# Patient Record
Sex: Female | Born: 1970
Health system: Southern US, Community
[De-identification: ages and names within clinical notes are randomized; demographics above are authoritative.]

## PROBLEM LIST (undated history)

## (undated) ENCOUNTER — Emergency Department (HOSPITAL_COMMUNITY): Admission: EM | Payer: Commercial Managed Care - HMO | Source: Home / Self Care

## (undated) DIAGNOSIS — J449 Chronic obstructive pulmonary disease, unspecified: Secondary | ICD-10-CM

## (undated) DIAGNOSIS — F419 Anxiety disorder, unspecified: Secondary | ICD-10-CM

## (undated) DIAGNOSIS — G8929 Other chronic pain: Secondary | ICD-10-CM

## (undated) DIAGNOSIS — M1990 Unspecified osteoarthritis, unspecified site: Secondary | ICD-10-CM

## (undated) DIAGNOSIS — C189 Malignant neoplasm of colon, unspecified: Secondary | ICD-10-CM

## (undated) DIAGNOSIS — J45909 Unspecified asthma, uncomplicated: Secondary | ICD-10-CM

## (undated) DIAGNOSIS — M797 Fibromyalgia: Secondary | ICD-10-CM

## (undated) DIAGNOSIS — F32A Depression, unspecified: Secondary | ICD-10-CM

## (undated) DIAGNOSIS — F319 Bipolar disorder, unspecified: Secondary | ICD-10-CM

## (undated) DIAGNOSIS — M5416 Radiculopathy, lumbar region: Secondary | ICD-10-CM

## (undated) DIAGNOSIS — F4310 Post-traumatic stress disorder, unspecified: Secondary | ICD-10-CM

## (undated) DIAGNOSIS — F329 Major depressive disorder, single episode, unspecified: Secondary | ICD-10-CM

## (undated) DIAGNOSIS — I509 Heart failure, unspecified: Secondary | ICD-10-CM

## (undated) DIAGNOSIS — M109 Gout, unspecified: Secondary | ICD-10-CM

## (undated) DIAGNOSIS — L80 Vitiligo: Secondary | ICD-10-CM

## (undated) DIAGNOSIS — C50919 Malignant neoplasm of unspecified site of unspecified female breast: Secondary | ICD-10-CM

## (undated) DIAGNOSIS — N289 Disorder of kidney and ureter, unspecified: Secondary | ICD-10-CM

## (undated) DIAGNOSIS — I219 Acute myocardial infarction, unspecified: Secondary | ICD-10-CM

## (undated) DIAGNOSIS — M549 Dorsalgia, unspecified: Secondary | ICD-10-CM

## (undated) HISTORY — DX: Malignant neoplasm of unspecified site of unspecified female breast: C50.919

## (undated) HISTORY — PX: TUBAL LIGATION: SHX77

## (undated) HISTORY — DX: Malignant neoplasm of colon, unspecified: C18.9

---

## 2014-06-21 ENCOUNTER — Emergency Department (HOSPITAL_COMMUNITY)
Admission: EM | Admit: 2014-06-21 | Discharge: 2014-06-21 | Discharge status: Against Medical Advice | Payer: Medicare Other | Attending: Emergency Medicine | Admitting: Emergency Medicine

## 2014-06-21 ENCOUNTER — Encounter (HOSPITAL_COMMUNITY): Payer: Self-pay | Admitting: Emergency Medicine

## 2014-06-21 ENCOUNTER — Emergency Department (HOSPITAL_COMMUNITY): Payer: Medicare Other

## 2014-06-21 DIAGNOSIS — Z79899 Other long term (current) drug therapy: Secondary | ICD-10-CM | POA: Diagnosis not present

## 2014-06-21 DIAGNOSIS — R079 Chest pain, unspecified: Secondary | ICD-10-CM | POA: Insufficient documentation

## 2014-06-21 DIAGNOSIS — Z8739 Personal history of other diseases of the musculoskeletal system and connective tissue: Secondary | ICD-10-CM | POA: Insufficient documentation

## 2014-06-21 DIAGNOSIS — G8929 Other chronic pain: Secondary | ICD-10-CM | POA: Insufficient documentation

## 2014-06-21 DIAGNOSIS — M545 Low back pain: Secondary | ICD-10-CM | POA: Diagnosis not present

## 2014-06-21 DIAGNOSIS — F329 Major depressive disorder, single episode, unspecified: Secondary | ICD-10-CM | POA: Insufficient documentation

## 2014-06-21 DIAGNOSIS — I509 Heart failure, unspecified: Secondary | ICD-10-CM | POA: Insufficient documentation

## 2014-06-21 DIAGNOSIS — F419 Anxiety disorder, unspecified: Secondary | ICD-10-CM | POA: Diagnosis not present

## 2014-06-21 DIAGNOSIS — Z720 Tobacco use: Secondary | ICD-10-CM | POA: Diagnosis not present

## 2014-06-21 DIAGNOSIS — M549 Dorsalgia, unspecified: Secondary | ICD-10-CM

## 2014-06-21 HISTORY — DX: Radiculopathy, lumbar region: M54.16

## 2014-06-21 HISTORY — DX: Heart failure, unspecified: I50.9

## 2014-06-21 HISTORY — DX: Dorsalgia, unspecified: M54.9

## 2014-06-21 HISTORY — DX: Major depressive disorder, single episode, unspecified: F32.9

## 2014-06-21 HISTORY — DX: Depression, unspecified: F32.A

## 2014-06-21 HISTORY — DX: Other chronic pain: G89.29

## 2014-06-21 HISTORY — DX: Anxiety disorder, unspecified: F41.9

## 2014-06-21 LAB — CBC WITH DIFFERENTIAL/PLATELET
Basophils Absolute: 0.1 10*3/uL (ref 0.0–0.1)
Basophils Relative: 1 % (ref 0–1)
Eosinophils Absolute: 0.1 10*3/uL (ref 0.0–0.7)
Eosinophils Relative: 2 % (ref 0–5)
HCT: 28.6 % — ABNORMAL LOW (ref 36.0–46.0)
Hemoglobin: 8.3 g/dL — ABNORMAL LOW (ref 12.0–15.0)
LYMPHS PCT: 20 % (ref 12–46)
Lymphs Abs: 1.9 10*3/uL (ref 0.7–4.0)
MCH: 16.9 pg — AB (ref 26.0–34.0)
MCHC: 29 g/dL — ABNORMAL LOW (ref 30.0–36.0)
MCV: 58.1 fL — ABNORMAL LOW (ref 78.0–100.0)
MONOS PCT: 5 % (ref 3–12)
Monocytes Absolute: 0.4 10*3/uL (ref 0.1–1.0)
NEUTROS PCT: 73 % (ref 43–77)
Neutro Abs: 6.7 10*3/uL (ref 1.7–7.7)
Platelets: 418 10*3/uL — ABNORMAL HIGH (ref 150–400)
RBC: 4.92 MIL/uL (ref 3.87–5.11)
RDW: 19.8 % — ABNORMAL HIGH (ref 11.5–15.5)
WBC: 9.2 10*3/uL (ref 4.0–10.5)

## 2014-06-21 LAB — BASIC METABOLIC PANEL
Anion gap: 14 (ref 5–15)
BUN: 15 mg/dL (ref 6–23)
CHLORIDE: 103 meq/L (ref 96–112)
CO2: 21 meq/L (ref 19–32)
Calcium: 8.7 mg/dL (ref 8.4–10.5)
Creatinine, Ser: 0.85 mg/dL (ref 0.50–1.10)
GFR calc Af Amer: >90 mL/min (ref >90)
GFR, EST NON AFRICAN AMERICAN: 83 mL/min — AB (ref >90)
GLUCOSE: 92 mg/dL (ref 70–99)
POTASSIUM: 3.6 meq/L — AB (ref 3.7–5.3)
Sodium: 138 mEq/L (ref 137–147)

## 2014-06-21 LAB — TROPONIN I

## 2014-06-21 LAB — PRO B NATRIURETIC PEPTIDE: PRO B NATRI PEPTIDE: 65.7 pg/mL (ref 0–125)

## 2014-06-21 MED ORDER — OXYCODONE-ACETAMINOPHEN 5-325 MG PO TABS
1.0000 | ORAL_TABLET | Freq: Once | ORAL | Status: AC
  Administered 2014-06-21: 1 via ORAL
  Filled 2014-06-21: qty 1

## 2014-06-21 MED ORDER — LORAZEPAM 1 MG PO TABS
1.0000 mg | ORAL_TABLET | Freq: Once | ORAL | Status: AC
  Administered 2014-06-21: 1 mg via ORAL
  Filled 2014-06-21: qty 1

## 2014-06-21 NOTE — ED Notes (Signed)
Patient reports sudden onset of centralized chest pain that started approximately an hour ago. Patient also reports shortness of breath and numbness down right side. Patient also states is under a great deal of stress currently and feels anxiety may be playing a role. Patient is currently homeless.

## 2014-06-21 NOTE — ED Notes (Signed)
Pt's family to desk stating pt is requesting to leave AMA.  Pt states she cannot stay until 2000 for repeat troponin due to transportation reasons.  Explained risks and benefits to staying and leaving.  Pt verbalized understanding.  Encouraged pt if pain gets worse to call 911.  Verbalized understanding.  Pt signed AMA form.

## 2014-06-21 NOTE — ED Provider Notes (Signed)
CSN: [id number]     Arrival date & time 06/21/14  1432 History   First MD Initiated Contact with Patient 06/21/14 1505     Chief Complaint  Patient presents with  . Chest Pain  . Anxiety  . Back Pain      HPI Pt was seen at 1551. Per pt, c/o gradual onset and persistence of constant mid-sternal chest "pain" that began approximately 1 hour PTA. Pt states she felt "SOB" when the discomfort began. Pt also c/o acute flair of her chronic right sided low back pain and chronic RLE "numbness." Pt states her symptoms began "after I was on the phone getting into it with someone." Endorses she "feels anxious" and that her symptoms today "might just be my anxiety."  Denies incont/retention of bowel or bladder, no saddle anesthesia, no focal motor weakness, no new tingling/numbness in extremities, no fevers, no injury, no abd pain, no palpitations, no cough.    Past Medical History  Diagnosis Date  . CHF (congestive heart failure)   . Anxiety   . Depression   . Chronic back pain   . Right lumbar radiculopathy    Past Surgical History  Procedure Laterality Date  . Tubal ligation      History  Substance Use Topics  . Smoking status: Current Every Day Smoker  . Smokeless tobacco: Not on file  . Alcohol Use: No    Review of Systems ROS: Statement: All systems negative except as marked or noted in the HPI; Constitutional: Negative for fever and chills. ; ; Eyes: Negative for eye pain, redness and discharge. ; ; ENMT: Negative for ear pain, hoarseness, nasal congestion, sinus pressure and sore throat. ; ; Cardiovascular: +CP, SOB. Negative for palpitations, diaphoresis, and peripheral edema. ; ; Respiratory: Negative for cough, wheezing and stridor. ; ; Gastrointestinal: Negative for nausea, vomiting, diarrhea, abdominal pain, blood in stool, hematemesis, jaundice and rectal bleeding. . ; ; Genitourinary: Negative for dysuria, flank pain and hematuria. ; ; Musculoskeletal: +LBP. Negative for neck  pain. Negative for swelling and trauma.; ; Skin: Negative for pruritus, rash, abrasions, blisters, bruising and skin lesion.; ; Neuro: +paresthesias. Negative for headache, lightheadedness and neck stiffness. Negative for weakness, altered level of consciousness , altered mental status, extremity weakness, involuntary movement, seizure and syncope.; Psych:  +anxiety. No SI, no SA, no HI, no hallucinations.      Allergies  Review of patient's allergies indicates no known allergies.  Home Medications   Prior to Admission medications   Medication Sig Start Date End Date Taking? Authorizing Provider  ALPRAZolam Duanne Moron) 1 MG tablet Take 1 mg by mouth at bedtime as needed for anxiety.   Yes Historical Provider, MD  nitroGLYCERIN (NITROSTAT) 0.4 MG SL tablet Place 0.4 mg under the tongue every 5 (five) minutes as needed for chest pain.   Yes Historical Provider, MD   BP 107/65  Pulse 84  Temp(Src) 98.1 F (36.7 C) (Oral)  Resp 19  Ht 5\' 2"  (1.575 m)  Wt 160 lb (72.576 kg)  BMI 29.26 kg/m2  SpO2 100%  LMP 06/02/2014 Physical Exam 1520: Physical examination:  Nursing notes reviewed; Vital signs and O2 SAT reviewed;  Constitutional: Well developed, Well nourished, Well hydrated, In no acute distress; Head:  Normocephalic, atraumatic; Eyes: EOMI, PERRL, No scleral icterus; ENMT: Mouth and pharynx normal, Mucous membranes moist; Neck: Supple, Full range of motion, No lymphadenopathy; Cardiovascular: Regular rate and rhythm, No murmur, rub, or gallop; Respiratory: Breath sounds clear & equal bilaterally, No  rales, rhonchi, wheezes.  Speaking full sentences with ease, Normal respiratory effort/excursion; Chest: Nontender, Movement normal; Abdomen: Soft, Nontender, Nondistended, Normal bowel sounds; Genitourinary: No CVA tenderness; Spine:  No midline CS, TS, LS tenderness. +TTP right lumbar paraspinal muscles.;; Extremities: Pulses normal, No tenderness, No edema, No calf edema or asymmetry.; Neuro:  AA&Ox3, Major CN grossly intact.Speech clear.  No facial droop.  No nystagmus. Grips equal. Strength 5/5 equal bilat UE's and LE's.  DTR 2/4 equal bilat UE's and LE's.  No gross sensory deficits.  Normal cerebellar testing bilat UE's (finger-nose) and LE's (heel-shin). Neg straight leg raises bilat..; Skin: Color normal, Warm, Dry.; Psych:  Anxious, poor eye contact.    ED Course  Procedures     EKG Interpretation   Date/Time:  Sunday June 21 2014 14:39:47 EDT Ventricular Rate:  86 PR Interval:  146 QRS Duration: 92 QT Interval:  383 QTC Calculation: 458 R Axis:   42 Text Interpretation:  Sinus rhythm No old tracing to compare Confirmed by  [facility]  MD, Nunzio Cory [phone]) on 06/21/2014 3:41:07 PM      MDM  MDM Reviewed: nursing note and vitals Interpretation: labs, ECG and x-ray   Results for orders placed during the hospital encounter of 06/21/14  CBC WITH DIFFERENTIAL      Result Value Ref Range   WBC 9.2  4.0 - 10.5 K/uL   RBC 4.92  3.87 - 5.11 MIL/uL   Hemoglobin 8.3 (*) 12.0 - 15.0 g/dL   HCT 28.6 (*) 36.0 - 46.0 %   MCV 58.1 (*) 78.0 - 100.0 fL   MCH 16.9 (*) 26.0 - 34.0 pg   MCHC 29.0 (*) 30.0 - 36.0 g/dL   RDW 19.8 (*) 11.5 - 15.5 %   Platelets 418 (*) 150 - 400 K/uL   Neutrophils Relative % 73  43 - 77 %   Neutro Abs 6.7  1.7 - 7.7 K/uL   Lymphocytes Relative 20  12 - 46 %   Lymphs Abs 1.9  0.7 - 4.0 K/uL   Monocytes Relative 5  3 - 12 %   Monocytes Absolute 0.4  0.1 - 1.0 K/uL   Eosinophils Relative 2  0 - 5 %   Eosinophils Absolute 0.1  0.0 - 0.7 K/uL   Basophils Relative 1  0 - 1 %   Basophils Absolute 0.1  0.0 - 0.1 K/uL  BASIC METABOLIC PANEL      Result Value Ref Range   Sodium 138  137 - 147 mEq/L   Potassium 3.6 (*) 3.7 - 5.3 mEq/L   Chloride 103  96 - 112 mEq/L   CO2 21  19 - 32 mEq/L   Glucose, Bld 92  70 - 99 mg/dL   BUN 15  6 - 23 mg/dL   Creatinine, Ser 0.85  0.50 - 1.10 mg/dL   Calcium 8.7  8.4 - 10.5 mg/dL   GFR calc non Af Amer  83 (*) >90 mL/min   GFR calc Af Amer >90  >90 mL/min   Anion gap 14  5 - 15  TROPONIN I      Result Value Ref Range   Troponin I <0.30  <0.30 ng/mL  PRO B NATRIURETIC PEPTIDE      Result Value Ref Range   Pro B Natriuretic peptide (BNP) 65.7  0 - 125 pg/mL   Dg Chest 2 View 06/21/2014   CLINICAL DATA:  Chest pain, history of congestive heart failure  EXAM: CHEST  2 VIEW  COMPARISON:  None.  FINDINGS: Cardiomediastinal silhouette is unremarkable. No acute infiltrate or pleural effusion. No pulmonary edema. Bony thorax is unremarkable.  IMPRESSION: No active cardiopulmonary disease.   Electronically Signed   By: Lahoma Crocker M.D.   On: 06/21/2014 15:46    1715:  Pt states she feels better after pain and anxiety medications and wants to leave now. Pt cannot elaborate her hx of "CHF." Workup today reassuring, will obtain repeat troponin at 2000. Pt states she does not want to stay.  ED RN and I encouraged pt to stay for 2nd troponin, continues to refuse.  Pt makes her own medical decisions.  Risks of AMA explained to pt, including, but not limited to:  stroke, heart attack, cardiac arrythmia ("irregular heart rate/beat"), "passing out," temporary and/or permanent disability, death.  Pt verb understanding and continues to refuse to stay for 2nd troponin, understanding the consequences of her decision.  I encouraged pt to follow up with her PMD tomorrow and return to the ED immediately if symptoms return, or for any other concerns.  Pt verb understanding, agreeable.     Francine Graven, DO 06/22/14 [phone]

## 2015-05-04 ENCOUNTER — Emergency Department (HOSPITAL_COMMUNITY)
Admission: EM | Admit: 2015-05-04 | Discharge: 2015-05-04 | Disposition: A | Discharge status: Home / Self Care | Payer: Commercial Managed Care - HMO | Attending: Emergency Medicine | Admitting: Emergency Medicine

## 2015-05-04 ENCOUNTER — Encounter (HOSPITAL_COMMUNITY): Payer: Self-pay | Admitting: Emergency Medicine

## 2015-05-04 DIAGNOSIS — Z8739 Personal history of other diseases of the musculoskeletal system and connective tissue: Secondary | ICD-10-CM | POA: Diagnosis not present

## 2015-05-04 DIAGNOSIS — G8929 Other chronic pain: Secondary | ICD-10-CM | POA: Diagnosis not present

## 2015-05-04 DIAGNOSIS — F419 Anxiety disorder, unspecified: Secondary | ICD-10-CM | POA: Diagnosis not present

## 2015-05-04 DIAGNOSIS — F329 Major depressive disorder, single episode, unspecified: Secondary | ICD-10-CM | POA: Insufficient documentation

## 2015-05-04 DIAGNOSIS — Z720 Tobacco use: Secondary | ICD-10-CM | POA: Insufficient documentation

## 2015-05-04 DIAGNOSIS — K122 Cellulitis and abscess of mouth: Secondary | ICD-10-CM | POA: Insufficient documentation

## 2015-05-04 DIAGNOSIS — I509 Heart failure, unspecified: Secondary | ICD-10-CM | POA: Insufficient documentation

## 2015-05-04 DIAGNOSIS — J029 Acute pharyngitis, unspecified: Secondary | ICD-10-CM | POA: Diagnosis present

## 2015-05-04 MED ORDER — AMOXICILLIN 250 MG PO CAPS
500.0000 mg | ORAL_CAPSULE | Freq: Once | ORAL | Status: AC
  Administered 2015-05-04: 500 mg via ORAL
  Filled 2015-05-04: qty 2

## 2015-05-04 MED ORDER — AMOXICILLIN 500 MG PO CAPS: 500.0000 mg | ORAL_CAPSULE | Freq: Three times a day (TID) | ORAL | Status: DC

## 2015-05-04 NOTE — ED Notes (Signed)
Pt states that she had Gi bleed 3 weeks ago and while vomiting sustained tear in her oesophagus - ( [facility]) pt here today as she is having continued throat pain

## 2015-05-04 NOTE — ED Provider Notes (Signed)
CSN: [id number]     Arrival date & time 05/04/15  2127 History   First MD Initiated Contact with Patient 05/04/15 2228     Chief Complaint  Patient presents with  . Sore Throat     (Consider location/radiation/quality/duration/timing/severity/associated sxs/prior Treatment) HPI Comments: Patient is a 44 year old female who presents to the emergency department with a complaint of sore throat.  The patient states that she was seen at the [facility] approximately 3 weeks ago and was told that she had a tear in her esophagus related to violent vomiting. The patient was seen by GI specialist, the bleeding has been controlled, but she was not placed on any medications at this time. The patient states that over the last few days she's been having increasing discomfort in her throat. She states it feels like her food sticks, and feels like something is sticking in her throat. She's not had high fever. There's been no further vomiting, and no diarrhea reported.  Patient is a 44 y.o. female presenting with pharyngitis. The history is provided by the patient.  Sore Throat    Past Medical History  Diagnosis Date  . CHF (congestive heart failure)   . Anxiety   . Depression   . Chronic back pain   . Right lumbar radiculopathy    Past Surgical History  Procedure Laterality Date  . Tubal ligation    . Cesarean section     History reviewed. No pertinent family history. Social History  Substance Use Topics  . Smoking status: Current Every Day Smoker -- 1.00 packs/day    Types: Cigarettes  . Smokeless tobacco: Former Systems developer  . Alcohol Use: No   OB History    No data available     Review of Systems    Allergies  Review of patient's allergies indicates no known allergies.  Home Medications   Prior to Admission medications   Medication Sig Start Date End Date Taking? Authorizing Provider  ALPRAZolam Duanne Moron) 1 MG tablet Take 1 mg by mouth at bedtime as needed for anxiety.     Historical Provider, MD  nitroGLYCERIN (NITROSTAT) 0.4 MG SL tablet Place 0.4 mg under the tongue every 5 (five) minutes as needed for chest pain.    Historical Provider, MD   BP 113/83 mmHg  Pulse 64  Temp(Src) 98.1 F (36.7 C) (Oral)  Resp 18  Ht 5\' 2"  (1.575 m)  Wt 180 lb (81.647 kg)  BMI 32.91 kg/m2  SpO2 100%  LMP 04/20/2015 (Approximate) Physical Exam  Constitutional: She is oriented to person, place, and time. She appears well-developed and well-nourished.  Non-toxic appearance.  HENT:  Head: Normocephalic.  Right Ear: Tympanic membrane and external ear normal.  Left Ear: Tympanic membrane and external ear normal.  There is mild increased redness of the posterior pharynx. The uvula is enlarged. The airway is patent. There is no swelling under the tongue.  Eyes: EOM and lids are normal. Pupils are equal, round, and reactive to light.  Neck: Normal range of motion. Neck supple. Carotid bruit is not present.  Cardiovascular: Normal rate, regular rhythm, normal heart sounds, intact distal pulses and normal pulses.   Pulmonary/Chest: Breath sounds normal. No respiratory distress.  Abdominal: Soft. Bowel sounds are normal. There is no tenderness. There is no guarding.  Musculoskeletal: Normal range of motion.  Lymphadenopathy:       Head (right side): No submandibular adenopathy present.       Head (left side): No submandibular adenopathy present.  She has no cervical adenopathy.  Neurological: She is alert and oriented to person, place, and time. She has normal strength. No cranial nerve deficit or sensory deficit.  Skin: Skin is warm and dry.  Psychiatric: She has a normal mood and affect. Her speech is normal.  Nursing note and vitals reviewed.   ED Course  Procedures (including critical care time) Labs Review Labs Reviewed - No data to display  Imaging Review No results found. I have personally reviewed and evaluated these images and lab results as part of my medical  decision-making.   EKG Interpretation None      MDM  The examination is consistent with uvulitis. The patient will use salt water gargles. The patient is placed on Amoxil. The patient is to follow-up with the primary physician if any changes or problems. The airway is patent. The patient is in no distress whatsoever while here in the emergency department.    Final diagnoses:  Uvulitis    **I have reviewed nursing notes, vital signs, and all appropriate lab and imaging results for this patient.Lily Kocher, PA-C 97/53/00 [id number]  Delora Fuel, MD 21/11/73 [id number]

## 2015-05-04 NOTE — Discharge Instructions (Signed)
You have swelling of your uvula. Please use salt water gargles 3 times daily. Please use Amoxil 3 times daily. Please see your primary physician for recheck if not improving. Uvulitis Uvulitis is redness and soreness (inflammation) of the uvula. The uvula is the small tongue-shaped piece of tissue in the back of your mouth.  CAUSES Infection is a common cause of uvulitis. Infection of the uvula can be either viral or bacterial. Infectious uvulitis usually only occurs in association with another condition, such as inflammation and infection of the mouth or throat. Other causes of uvulitis include:  Trauma to the uvula.  Swelling from excess fluid buildup (edema), which may be an allergic reaction.  Inhalation of irritants, such as chemical agents, smoke, or steam. DIAGNOSIS Your caregiver can usually diagnose uvulitis through a physical examination. Bacterial uvulitis can be diagnosed through the results of the growth of samples of bodily substances taken from your mouth (cultures). HOME CARE INSTRUCTIONS   Rest as much as possible.  Young children may suck on frozen juice bars or frozen ice pops. Older children and adults may gargle with a warm or cold liquid to help soothe the throat. (Mix  tsp of salt in 8 oz of water, or use strong tea.)  Use a cool-mist humidifier to lessen throat irritation and cough.  Drink enough fluids to keep your urine clear or pale yellow.  While the throat is very sore, eat soft or liquid foods such as milk, ice cream, soups, or milk drinks.  Family members who develop a sore throat or fever should have a medical exam or throat culture.  If your child has uvulitis and is taking antibiotic medicine, wait 24 hours or until his or her temperature is near normal (less than 100 F [37.8 C]) before allowing him or her to return to school or day care.  Only take over-the-counter or prescription medicines for pain, discomfort, or fever as directed by your  caregiver. Ask when your test results will be ready. Make sure you get your test results. SEEK MEDICAL CARE IF:   You have an oral temperature above 102 F (38.9 C).  You develop large, tender lumps your the neck.  Your child develops a rash.  You cough up green, yellow-brown, or bloody substances. SEEK IMMEDIATE MEDICAL CARE IF:   You develop any new symptoms, such as vomiting, earache, severe headache, stiff neck, chest pain, or trouble breathing or swallowing.  Your airway is blocked.  You develop more severe throat pain along with drooling or voice changes. Document Released: 04/07/2004 Document Revised: 11/20/2011 Document Reviewed: 11/03/2010 [facility] Patient Information 2015 [place], [place]. This information is not intended to replace advice given to you by your health care provider. Make sure you discuss any questions you have with your health care provider.

## 2015-05-05 ENCOUNTER — Encounter: Payer: Self-pay | Admitting: Adult Health

## 2015-05-10 ENCOUNTER — Ambulatory Visit: Payer: Self-pay | Admitting: Family Medicine

## 2015-05-11 ENCOUNTER — Encounter: Payer: Self-pay | Admitting: General Practice

## 2015-05-21 ENCOUNTER — Ambulatory Visit (INDEPENDENT_AMBULATORY_CARE_PROVIDER_SITE_OTHER): Payer: Medicare HMO | Admitting: Family Medicine

## 2015-05-21 ENCOUNTER — Encounter: Payer: Self-pay | Admitting: Family Medicine

## 2015-05-21 VITALS — BP 104/67 | HR 79 | Temp 97.7°F | Ht 62.0 in | Wt 186.6 lb

## 2015-05-21 DIAGNOSIS — F329 Major depressive disorder, single episode, unspecified: Secondary | ICD-10-CM

## 2015-05-21 DIAGNOSIS — F32A Depression, unspecified: Secondary | ICD-10-CM

## 2015-05-21 DIAGNOSIS — K226 Gastro-esophageal laceration-hemorrhage syndrome: Secondary | ICD-10-CM | POA: Diagnosis not present

## 2015-05-21 DIAGNOSIS — N920 Excessive and frequent menstruation with regular cycle: Secondary | ICD-10-CM | POA: Insufficient documentation

## 2015-05-21 DIAGNOSIS — G629 Polyneuropathy, unspecified: Secondary | ICD-10-CM | POA: Diagnosis not present
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
## 2015-05-22 LAB — CBC WITH DIFFERENTIAL/PLATELET
BASOS: 1 %
Basophils Absolute: 0.1 10*3/uL (ref 0.0–0.2)
EOS (ABSOLUTE): 0.2 10*3/uL (ref 0.0–0.4)
EOS: 2 %
HEMOGLOBIN: 8.7 g/dL — AB (ref 11.1–15.9)
Hematocrit: 30.7 % — ABNORMAL LOW (ref 34.0–46.6)
Immature Grans (Abs): 0 10*3/uL (ref 0.0–0.1)
Immature Granulocytes: 0 %
LYMPHS ABS: 2.2 10*3/uL (ref 0.7–3.1)
LYMPHS: 24 %
MCH: 17.4 pg — ABNORMAL LOW (ref 26.6–33.0)
MCHC: 28.3 g/dL — AB (ref 31.5–35.7)
MCV: 62 fL — AB (ref 79–97)
Monocytes Absolute: 0.5 10*3/uL (ref 0.1–0.9)
Monocytes: 5 %
NEUTROS ABS: 6.1 10*3/uL (ref 1.4–7.0)
Neutrophils: 68 %
Platelets: 355 10*3/uL (ref 150–379)
RBC: 4.99 x10E6/uL (ref 3.77–5.28)
RDW: 18.1 % — ABNORMAL HIGH (ref 12.3–15.4)
WBC: 9 10*3/uL (ref 3.4–10.8)

## 2015-05-22 LAB — CMP14+EGFR
ALBUMIN: 3.8 g/dL (ref 3.5–5.5)
ALT: 10 IU/L (ref 0–32)
AST: 13 IU/L (ref 0–40)
Albumin/Globulin Ratio: 1.4 (ref 1.1–2.5)
Alkaline Phosphatase: 82 IU/L (ref 39–117)
BUN / CREAT RATIO: 16 (ref 9–23)
BUN: 12 mg/dL (ref 6–24)
CALCIUM: 8.7 mg/dL (ref 8.7–10.2)
CO2: 24 mmol/L (ref 18–29)
Chloride: 104 mmol/L (ref 97–108)
Creatinine, Ser: 0.74 mg/dL (ref 0.57–1.00)
GFR calc non Af Amer: 99 mL/min/{1.73_m2} (ref >59)
GFR, EST AFRICAN AMERICAN: 114 mL/min/{1.73_m2} (ref >59)
Globulin, Total: 2.7 g/dL (ref 1.5–4.5)
Glucose: 78 mg/dL (ref 65–99)
Potassium: 4.7 mmol/L (ref 3.5–5.2)
Sodium: 141 mmol/L (ref 134–144)
TOTAL PROTEIN: 6.5 g/dL (ref 6.0–8.5)

## 2015-05-22 LAB — FOLATE: FOLATE: 8.1 ng/mL (ref >3.0)

## 2015-05-24 ENCOUNTER — Telehealth: Payer: Self-pay | Admitting: Family Medicine

## 2015-05-24 ENCOUNTER — Other Ambulatory Visit: Payer: Self-pay | Admitting: Family Medicine

## 2015-05-24 MED ORDER — FERROUS SULFATE 325 (65 FE) MG PO TABS: 325.0000 mg | ORAL_TABLET | Freq: Two times a day (BID) | ORAL | Status: DC

## 2015-05-31 ENCOUNTER — Other Ambulatory Visit (HOSPITAL_COMMUNITY)
Admission: RE | Admit: 2015-05-31 | Discharge: 2015-05-31 | Disposition: A | Discharge status: Home / Self Care | Payer: Commercial Managed Care - HMO | Source: Ambulatory Visit | Attending: Obstetrics & Gynecology | Admitting: Obstetrics & Gynecology

## 2015-05-31 ENCOUNTER — Encounter: Payer: Self-pay | Admitting: Obstetrics & Gynecology

## 2015-05-31 ENCOUNTER — Ambulatory Visit (INDEPENDENT_AMBULATORY_CARE_PROVIDER_SITE_OTHER): Payer: Commercial Managed Care - HMO | Admitting: Obstetrics & Gynecology

## 2015-05-31 ENCOUNTER — Other Ambulatory Visit (INDEPENDENT_AMBULATORY_CARE_PROVIDER_SITE_OTHER): Payer: Commercial Managed Care - HMO

## 2015-05-31 VITALS — BP 110/70 | HR 72 | Ht 62.0 in | Wt 186.0 lb

## 2015-05-31 DIAGNOSIS — Z1151 Encounter for screening for human papillomavirus (HPV): Secondary | ICD-10-CM | POA: Diagnosis present

## 2015-05-31 DIAGNOSIS — N946 Dysmenorrhea, unspecified: Secondary | ICD-10-CM

## 2015-05-31 DIAGNOSIS — N921 Excessive and frequent menstruation with irregular cycle: Secondary | ICD-10-CM

## 2015-05-31 DIAGNOSIS — Z01419 Encounter for gynecological examination (general) (routine) without abnormal findings: Secondary | ICD-10-CM | POA: Diagnosis present

## 2015-05-31 DIAGNOSIS — D500 Iron deficiency anemia secondary to blood loss (chronic): Secondary | ICD-10-CM

## 2015-05-31 DIAGNOSIS — N750 Cyst of Bartholin's gland: Secondary | ICD-10-CM | POA: Diagnosis not present

## 2015-05-31 DIAGNOSIS — Z124 Encounter for screening for malignant neoplasm of cervix: Secondary | ICD-10-CM | POA: Diagnosis not present

## 2015-05-31 MED ORDER — MEGESTROL ACETATE 40 MG PO TABS
ORAL_TABLET | ORAL | Status: DC
Start: 2015-05-31 — End: 2015-07-14

## 2015-05-31 MED ORDER — OMEPRAZOLE 20 MG PO CPDR: 20.0000 mg | DELAYED_RELEASE_CAPSULE | Freq: Every day | ORAL | Status: DC

## 2015-05-31 NOTE — Progress Notes (Signed)
PELVIC US TA/TV: anteverted uterus with a 1.5 x 1 x 1.4 cm submucosal fibroid,EEC 4.9 mm with a 1.9 x 1.7 x 1.4 cm polyp,normal (mobile) ov's bilat,no free fluid seen

## 2015-05-31 NOTE — Progress Notes (Signed)
Patient ID: Lynn Logan, female   DOB: [birth year], 44 y.o.   MRN: [id number] Chief Complaint  Patient presents with  . new gyn visit    heavy bleeding / painful.    Blood pressure 110/70, pulse 72, height 5\' 2"  (1.575 m), weight 186 lb (84.369 kg), last menstrual period 05/19/2015.  44 y.o. No obstetric history on file. Patient's last menstrual period was 05/19/2015. The current method of family planning is tubal ligation.  Subjective Worsening menstrual cycles over the past few years especially bad the last 2 months 10-12 days of bleeding  Objective Vulva:  normal appearing vulva with no masses, tenderness or lesions, vulvar hypopigmentation pt has vitiligo and she has a 2 cm left Bartolin's gland cyst Vagina:  normal mucosa, no discharge Cervix:  no bleeding following Pap, no cervical motion tenderness and no lesions Uterus:  normal size, contour, position, consistency, mobility, non-tender Adnexa: ovaries:present,  normal adnexa in size, nontender and no masses    Pertinent ROS No burning with urination, frequency or urgency No nausea, vomiting or diarrhea Nor fever chills or other constitutional symptoms   Labs or studies US Transvaginal Non-ob  05/31/2015   GYNECOLOGIC SONOGRAM   Lynn Logan is a 44 y.o. LMP 05/19/2015 for a pelvic sonogram for  dysmenorrhea and menometrorrhagia.Marland Kitchen  Uterus                      12.15 x 6.2 x 5.29 cm, anteverted uterus with  a 1.5 x 1 x 1.4 cm submucosal fibroid  Endometrium          4.9 mm, asymmetrical, 1.9 x 1.7 x 1.4 cm polyp  Right ovary             4.2 x 3.2 x 2.0 cm, wnl  Left ovary                2.8 x 2.3 x 2.1 cm, wnl    Technician Comments:  PELVIC US TA/TV: anteverted uterus with a 1.5 x 1 x 1.4 cm submucosal  fibroid,EEC 4.9 mm with a 1.9 x 1.7 x 1.4 cm polyp,normal (mobile) ov's  bilat,no free fluid seen    [employer] 05/31/2015 2:57 PM  Clinical Impression and recommendations:  I have reviewed the sonogram results above,  combined with the patient's  current clinical course, below are my impressions and any appropriate  recommendations for management based on the sonographic findings.  Endometrium with small submucosal myoma and a large polyp Uterus generous sized but normal Ovaries normal, no cysts   EURE,LUTHER H 05/31/2015 2:58 PM     US Pelvis Complete  05/31/2015   GYNECOLOGIC SONOGRAM   Lynn Logan is a 44 y.o. LMP 05/19/2015 for a pelvic sonogram for  dysmenorrhea and menometrorrhagia.Marland Kitchen  Uterus                      12.15 x 6.2 x 5.29 cm, anteverted uterus with  a 1.5 x 1 x 1.4 cm submucosal fibroid  Endometrium          4.9 mm, asymmetrical, 1.9 x 1.7 x 1.4 cm polyp  Right ovary             4.2 x 3.2 x 2.0 cm, wnl  Left ovary                2.8 x 2.3 x 2.1 cm, wnl    Technician Comments:  PELVIC US TA/TV: anteverted  uterus with a 1.5 x 1 x 1.4 cm submucosal  fibroid,EEC 4.9 mm with a 1.9 x 1.7 x 1.4 cm polyp,normal (mobile) ov's  bilat,no free fluid seen    [employer] 05/31/2015 2:57 PM  Clinical Impression and recommendations:  I have reviewed the sonogram results above, combined with the patient's  current clinical course, below are my impressions and any appropriate  recommendations for management based on the sonographic findings.  Endometrium with small submucosal myoma and a large polyp Uterus generous sized but normal Ovaries normal, no cysts   EURE,LUTHER H 05/31/2015 2:58 PM        Impression Diagnoses this Encounter::   ICD-9-CM ICD-10-CM   1. Dysmenorrhea 625.3 N94.6 US Pelvis Complete     US Transvaginal Non-OB  2. Menometrorrhagia 626.2 N92.1 US Pelvis Complete     US Transvaginal Non-OB  3. Iron deficiency anemia due to chronic blood loss 280.0 D50.0 US Pelvis Complete     US Transvaginal Non-OB  4. Bartholin's duct cyst 616.2 N75.0   5. Screening for cervical cancer V76.2 Z12.4 Cytology - PAP    Established relevant diagnosis(es):   Plan/Recommendations: Meds ordered this encounter   Medications  . megestrol (MEGACE) 40 MG tablet    Sig: 3 tablets a day for 5 days, 2 tablets a day for 5 days then 1 tablet daily    Dispense:  45 tablet    Refill:  3  . omeprazole (PRILOSEC) 20 MG capsule    Sig: Take 1 capsule (20 mg total) by mouth daily. 1 tablet a day    Dispense:  30 capsule    Refill:  6    Labs or Scans Ordered: Orders Placed This Encounter  Procedures  . US Pelvis Complete  . US Transvaginal Non-OB      Follow up Return in about 1 month (around 06/30/2015) for Follow up, with Dr Elonda Husky.      Past Medical History  Diagnosis Date  . CHF (congestive heart failure)   . Anxiety   . Depression   . Chronic back pain   . Right lumbar radiculopathy     Past Surgical History  Procedure Laterality Date  . Tubal ligation    . Cesarean section      OB History    No data available      No Known Allergies  Social History   Social History  . Marital Status: Divorced    Spouse Name: N/A  . Number of Children: N/A  . Years of Education: N/A   Social History Main Topics  . Smoking status: Current Every Day Smoker -- 1.00 packs/day    Types: Cigarettes  . Smokeless tobacco: Former Systems developer  . Alcohol Use: No  . Drug Use: No     Comment: Hx of marijuana use - None now  . Sexual Activity: Yes    Birth Control/ Protection: Surgical   Other Topics Concern  . None   Social History Narrative    Family History  Problem Relation Age of Onset  . Diabetes Mother   . Congestive Heart Failure Mother   . Depression Father   . Hypertension Father   . Cancer Father       All questions were answered.

## 2015-06-01 LAB — CYTOLOGY - PAP

## 2015-06-07 ENCOUNTER — Telehealth: Payer: Self-pay | Admitting: Family Medicine

## 2015-06-09 ENCOUNTER — Telehealth: Payer: Self-pay | Admitting: Family Medicine

## 2015-06-10 ENCOUNTER — Telehealth: Payer: Self-pay | Admitting: Family Medicine

## 2015-06-10 NOTE — Telephone Encounter (Signed)
Do not do creams

## 2015-06-10 NOTE — Telephone Encounter (Signed)
duplicate

## 2015-06-11 ENCOUNTER — Ambulatory Visit (INDEPENDENT_AMBULATORY_CARE_PROVIDER_SITE_OTHER): Payer: Commercial Managed Care - HMO | Admitting: Family Medicine

## 2015-06-11 ENCOUNTER — Encounter: Payer: Self-pay | Admitting: Family Medicine

## 2015-06-11 VITALS — BP 124/79 | HR 78 | Temp 98.9°F | Ht 62.0 in | Wt 185.6 lb

## 2015-06-11 DIAGNOSIS — K226 Gastro-esophageal laceration-hemorrhage syndrome: Secondary | ICD-10-CM

## 2015-06-11 DIAGNOSIS — M549 Dorsalgia, unspecified: Secondary | ICD-10-CM | POA: Insufficient documentation

## 2015-06-11 DIAGNOSIS — G629 Polyneuropathy, unspecified: Secondary | ICD-10-CM

## 2015-06-11 DIAGNOSIS — M5442 Lumbago with sciatica, left side: Secondary | ICD-10-CM

## 2015-06-11 MED ORDER — GABAPENTIN 300 MG PO CAPS: 300.0000 mg | ORAL_CAPSULE | Freq: Every day | ORAL | Status: DC

## 2015-06-11 MED ORDER — TRIAMCINOLONE ACETONIDE 40 MG/ML IJ SUSP
40.0000 mg | Freq: Once | INTRAMUSCULAR | Status: AC
  Administered 2015-06-11: 40 mg via INTRAMUSCULAR

## 2015-06-11 NOTE — Progress Notes (Addendum)
   HPI  Patient presents today for follow-up hematemesis, neuropathy, and a new problem of low back pain.  Hematemesis, dysphagia Patient explains that she's had several weeks now stable hematemesis described as about 1 teaspoon of blood for one episode of vomiting that happens around 2 AM every day. She also describes that she's having difficulty swallowing her food She has seen a GI doctor about a week ago and has an EGD and colonoscopy scheduled for 3 weeks from now. She reports seeing Dr. Carlis Abbott at [facility] in [place].    Peripheral neuropathy Not improving, no changes. Described as numbness and tingling type pain in her fingertips.  Back pain She has new onset of left-sided low back pain described as sharp pain that radiates down her left leg, this is been going on for about one week. She states that it hurts to walk that she's not having any difficulty walking. She denies bowel or bladder dysfunction, and saddle anesthesia.  PMH: Smoking status noted ROS: Per HPI  Objective: BP 124/79 mmHg  Pulse 78  Temp(Src) 98.9 F (37.2 C) (Oral)  Ht 5\' 2"  (1.575 m)  Wt 185 lb 9.6 oz (84.188 kg)  BMI 33.94 kg/m2  LMP 05/19/2015 Gen: NAD, alert, cooperative with exam HEENT: NCAT CV: RRR, good S1/S2, no murmur Resp: CTABL, no wheezes, non-labored Ext: No edema, warm Neuro: Alert and oriented, strength 5/5 and sensation intact in bilateral lower extremities, 2+ patellar tendon reflexes, great toe strength 5/5 bilaterally Musculoskeletal, left-sided lumbar paraspinal tenderness to palpation, with less severe generalized tenderness to palpation across the rest of her lumbar back  Assessment and plan:  # Hematemesis She is following up with GI appropriately, she's not have any clinical signs of volume contraction Discussed red flags in detail for seeking emergency medical care. She is scheduled for an EGD, she's previously/recently had an EGD showing Mallory-Weiss tears. It  is unusual that she's having one episode per day around 2 AM Avoiding oral steroid and NSAIDs given possibility of stomach pathology, however I do believe it is most likely that she has Mallory-Weiss tears Dysphagia also her GI  # Neuropathy I feel this is most likely due to anemia, her hemoglobin was last checked 8.7 She has metromenorrhagia being treated with Megace currently by GYN Start trial of gabapentin, however with anemia I am unsure of how well this will help, it is likely to help her back pain  # Back pain Consistent with sciatica, she reports one recent fall but her neuro exam is reassuring, gait normal Given IM Kenalog in the clinic today Also given gabapentin Provided red flags for return and back pain emergency.   Meds ordered this encounter  Medications  . traMADol-acetaminophen (ULTRACET) 37.5-325 MG tablet    Sig:   . GAVILYTE-G 236 G solution    Sig:   . LINZESS 145 MCG CAPS capsule    Sig:   . escitalopram (LEXAPRO) 10 MG tablet    Sig:   . gabapentin (NEURONTIN) 300 MG capsule    Sig: Take 1 capsule (300 mg total) by mouth at bedtime.    Dispense:  30 capsule    Refill:  [place], MD [facility] 06/11/2015, 11:08 AM

## 2015-06-11 NOTE — Addendum Note (Signed)
Addended by: Nigel Berthold C on: 06/11/2015 11:43 AM   Modules accepted: Orders

## 2015-06-11 NOTE — Patient Instructions (Signed)
Great to see you!  If your vomiting blood gets worse or if you develop weakness, racing heart, or feel faint please seek immediate medical help.  Gabapentin is for your tingling fingers and you back pain,      Back Pain, Adult Low back pain is very common. About 1 in 5 people have back pain.The cause of low back pain is rarely dangerous. The pain often gets better over time.About half of people with a sudden onset of back pain feel better in just 2 weeks. About 8 in 10 people feel better by 6 weeks.  CAUSES Some common causes of back pain include:  Strain of the muscles or ligaments supporting the spine.  Wear and tear (degeneration) of the spinal discs.  Arthritis.  Direct injury to the back. DIAGNOSIS Most of the time, the direct cause of low back pain is not known.However, back pain can be treated effectively even when the exact cause of the pain is unknown.Answering your caregiver's questions about your overall health and symptoms is one of the most accurate ways to make sure the cause of your pain is not dangerous. If your caregiver needs more information, he or she may order lab work or imaging tests (X-rays or MRIs).However, even if imaging tests show changes in your back, this usually does not require surgery. HOME CARE INSTRUCTIONS For many people, back pain returns.Since low back pain is rarely dangerous, it is often a condition that people can learn to [facility] their own.   Remain active. It is stressful on the back to sit or stand in one place. Do not sit, drive, or stand in one place for more than 30 minutes at a time. Take short walks on level surfaces as soon as pain allows.Try to increase the length of time you walk each day.  Do not stay in bed.Resting more than 1 or 2 days can delay your recovery.  Do not avoid exercise or work.Your body is made to move.It is not dangerous to be active, even though your back may hurt.Your back will likely heal faster if you  return to being active before your pain is gone.  Pay attention to your body when you bend and lift. Many people have less discomfortwhen lifting if they bend their knees, keep the load close to their bodies,and avoid twisting. Often, the most comfortable positions are those that put less stress on your recovering back.  Find a comfortable position to sleep. Use a firm mattress and lie on your side with your knees slightly bent. If you lie on your back, put a pillow under your knees.  Only take over-the-counter or prescription medicines as directed by your caregiver. Over-the-counter medicines to reduce pain and inflammation are often the most helpful.Your caregiver may prescribe muscle relaxant drugs.These medicines help dull your pain so you can more quickly return to your normal activities and healthy exercise.  Put ice on the injured area.  Put ice in a plastic bag.  Place a towel between your skin and the bag.  Leave the ice on for 15-20 minutes, 03-04 times a day for the first 2 to 3 days. After that, ice and heat may be alternated to reduce pain and spasms.  Ask your caregiver about trying back exercises and gentle massage. This may be of some benefit.  Avoid feeling anxious or stressed.Stress increases muscle tension and can worsen back pain.It is important to recognize when you are anxious or stressed and learn ways to manage it.Exercise is a  great option. SEEK MEDICAL CARE IF:  You have pain that is not relieved with rest or medicine.  You have pain that does not improve in 1 week.  You have new symptoms.  You are generally not feeling well. SEEK IMMEDIATE MEDICAL CARE IF:   You have pain that radiates from your back into your legs.  You develop new bowel or bladder control problems.  You have unusual weakness or numbness in your arms or legs.  You develop nausea or vomiting.  You develop abdominal pain.  You feel faint. Document Released: 08/28/2005  Document Revised: 02/27/2012 Document Reviewed: 12/30/2013 [facility] Patient Information 2015 [place], [place]. This information is not intended to replace advice given to you by your health care provider. Make sure you discuss any questions you have with your health care provider.

## 2015-06-14 ENCOUNTER — Telehealth: Payer: Self-pay | Admitting: Family Medicine

## 2015-06-14 DIAGNOSIS — M5442 Lumbago with sciatica, left side: Secondary | ICD-10-CM

## 2015-06-15 MED ORDER — GABAPENTIN 300 MG PO CAPS: 300.0000 mg | ORAL_CAPSULE | Freq: Three times a day (TID) | ORAL | Status: DC

## 2015-06-15 NOTE — Telephone Encounter (Signed)
Multiple attempts to contact patient.  This encounter will now be closed  

## 2015-06-15 NOTE — Telephone Encounter (Signed)
Sent TID gabapentin.   Laroy Apple, MD [facility] 06/15/2015, 10:52 AM

## 2015-06-15 NOTE — Telephone Encounter (Signed)
Order in epic is different?

## 2015-06-16 ENCOUNTER — Telehealth: Payer: Self-pay | Admitting: Family Medicine

## 2015-06-17 NOTE — Telephone Encounter (Signed)
LM - need to know what inh she is on and why she wants to switch?

## 2015-06-18 ENCOUNTER — Other Ambulatory Visit: Payer: Self-pay | Admitting: Family Medicine

## 2015-06-24 ENCOUNTER — Ambulatory Visit (INDEPENDENT_AMBULATORY_CARE_PROVIDER_SITE_OTHER): Payer: Commercial Managed Care - HMO | Admitting: Family Medicine

## 2015-06-24 ENCOUNTER — Telehealth: Payer: Self-pay | Admitting: Family Medicine

## 2015-06-24 ENCOUNTER — Encounter: Payer: Self-pay | Admitting: Family Medicine

## 2015-06-24 VITALS — BP 111/76 | HR 77 | Temp 98.7°F | Ht 62.0 in | Wt 185.6 lb

## 2015-06-24 DIAGNOSIS — M79601 Pain in right arm: Secondary | ICD-10-CM | POA: Insufficient documentation

## 2015-06-24 DIAGNOSIS — M79604 Pain in right leg: Secondary | ICD-10-CM | POA: Diagnosis not present

## 2015-06-24 MED ORDER — KETOROLAC TROMETHAMINE 60 MG/2ML IM SOLN
60.0000 mg | Freq: Once | INTRAMUSCULAR | Status: AC
  Administered 2015-06-24: 60 mg via INTRAMUSCULAR

## 2015-06-24 MED ORDER — DICLOFENAC SODIUM 75 MG PO TBEC: 75.0000 mg | DELAYED_RELEASE_TABLET | Freq: Two times a day (BID) | ORAL | Status: DC | PRN

## 2015-06-24 MED ORDER — DICLOFENAC SODIUM 1 % TD GEL: 2.0000 g | Freq: Four times a day (QID) | TRANSDERMAL | Status: DC

## 2015-06-24 NOTE — Progress Notes (Signed)
   HPI  Patient presents today here with right arm and right leg pain.  Patient explained she's had one day onset of right upper extremity pain described as numbness and tingly type pain extending from her mid neck down to her hand, also pain down her side and down to her knee.  She denies any weakness, trauma, headache, confusion, or difficulty talking  She explains that she was recently hospitalized for chest pain rule out [facility]. She states that she previously take hydrocodone for pain She has tried nothing for pain today.  She denies neck pain or usual neck problems. She does have chronic neuropathyy bowel or bladder dysfunction, leg weakness, or saddle anesthesia.  PMH: Smoking status noted ROS: Per HPI  Objective: BP 111/76 mmHg  Pulse 77  Temp(Src) 98.7 F (37.1 C) (Oral)  Ht 5\' 2"  (1.575 m)  Wt 185 lb 9.6 oz (84.188 kg)  BMI 33.94 kg/m2  LMP 05/19/2015 Gen: NAD, alert, cooperative with exam HEENT: NCAT, EOMI, PERRLA CV: RRR, good S1/S2, no murmur Resp: CTABL, no wheezes, non-labored Abd: SNTND, BS present, no guarding or organomegaly Ext: No edema, warm Neuro: Alert and oriented, strength 5/5 and sensation intact in all 4 extremities, 2+ patellar tendon and brachioradialis reflexes, normal gait   skin: Hypopigmented patches scattered across her body consistent with vitiligo  Assessment and plan:  #  right arm pain, right leg pain Unclear etiology, however her neuro exam is reassuring that there are no serious central lesions or risk of stroke. Initially considered neck pain with radiculopathy however her leg pain is not consistent with this. Treat with Toradol 1, caution with NSAIDs with her recent Mallory-Weiss tears Voltaren gel topically, heat Tylenol 1 g 3 times a day 3 days Come back if not improved, or outline red flags for stroke & back emergencies and she will seek immediate medical care if these occur.     Meds ordered this encounter    Medications  . DISCONTD: diclofenac (VOLTAREN) 75 MG EC tablet    Sig: Take 1 tablet (75 mg total) by mouth 2 (two) times daily as needed.    Dispense:  20 tablet    Refill:  0  . diclofenac sodium (VOLTAREN) 1 % GEL    Sig: Apply 2 g topically 4 (four) times daily.    Dispense:  100 g    Refill:  2  . ketorolac (TORADOL) injection 60 mg    Sig:     Laroy Apple, MD [facility] 06/24/2015, 4:31 PM

## 2015-06-24 NOTE — Patient Instructions (Addendum)
Great to see you!  Try tylenol, 2 pills three times daily and voltaren gel on your painful areas  Also try heat  PLease seek immediate medical attention if you develop numbness or weakness on one side of the body, difficulty talking or walking,  If you lose control of your bowels or if your throwing up blood worsens  Avoid ibuprofen and aleve

## 2015-06-24 NOTE — Telephone Encounter (Signed)
No Voicemail to leave message

## 2015-06-25 NOTE — Telephone Encounter (Signed)
Pt walked into office today wanting to see if Dr.Bradshaw would write her a rx for breathing machine and nebulizer solution. Pt is aware you are out of the office until Monday and she is ok with that.

## 2015-06-28 ENCOUNTER — Telehealth: Payer: Self-pay | Admitting: Family Medicine

## 2015-06-28 NOTE — Telephone Encounter (Signed)
We are aware and waiting on forms

## 2015-06-28 NOTE — Telephone Encounter (Signed)
I don't see any breathing problems on her list or albuterol on her med list. I think she should be seen if she is having trouble breathing.   Laroy Apple, MD [facility] 06/28/2015, 7:58 AM

## 2015-06-28 NOTE — Telephone Encounter (Signed)
Patient aware that she will need to be seen  

## 2015-06-28 NOTE — Telephone Encounter (Signed)
Spoke with Kenney Houseman, She is working on this. It is a Emergency planning/management officer from [employer]

## 2015-06-29 ENCOUNTER — Telehealth: Payer: Self-pay | Admitting: Family Medicine

## 2015-06-29 NOTE — Telephone Encounter (Signed)
   I dont usually prescribe back and knee braces as they dont generally have much efficacy. We can discuss this at a follow up appointment. For now I will decline back and knee brace rx request.   Laroy Apple, MD [facility] 06/29/2015, 11:47 AM

## 2015-06-30 ENCOUNTER — Ambulatory Visit (INDEPENDENT_AMBULATORY_CARE_PROVIDER_SITE_OTHER): Payer: Commercial Managed Care - HMO | Admitting: Obstetrics & Gynecology

## 2015-06-30 ENCOUNTER — Encounter: Payer: Self-pay | Admitting: Obstetrics & Gynecology

## 2015-06-30 VITALS — BP 134/74 | HR 64 | Wt 184.0 lb

## 2015-06-30 DIAGNOSIS — N921 Excessive and frequent menstruation with irregular cycle: Secondary | ICD-10-CM | POA: Diagnosis not present

## 2015-06-30 DIAGNOSIS — N946 Dysmenorrhea, unspecified: Secondary | ICD-10-CM

## 2015-06-30 DIAGNOSIS — N840 Polyp of corpus uteri: Secondary | ICD-10-CM | POA: Diagnosis not present

## 2015-07-01 ENCOUNTER — Telehealth: Payer: Self-pay | Admitting: Family Medicine

## 2015-07-01 NOTE — Patient Instructions (Signed)
Lynn Logan  07/01/2015     @PREFPERIOPPHARMACY @   Your procedure is scheduled on  07/07/2015   Report to [facility] at  825  A.M.  Call this number if you have problems the morning of surgery:  [phone]   Remember:  Do not eat food or drink liquids after midnight.  Take these medicines the morning of surgery with A SIP OF WATER  Lexapro, gabapentin, prilosec, ultracet.   Do not wear jewelry, make-up or nail polish.  Do not wear lotions, powders, or perfumes.  You may wear deodorant.  Do not shave 48 hours prior to surgery.  Men may shave face and neck.  Do not bring valuables to the hospital.  [facility] is not responsible for any belongings or valuables.  Contacts, dentures or bridgework may not be worn into surgery.  Leave your suitcase in the car.  After surgery it may be brought to your room.  For patients admitted to the hospital, discharge time will be determined by your treatment team.  Patients discharged the day of surgery will not be allowed to drive home.   Name and phone number of your driver:   family Special instructions:  none  Please read over the following fact sheets that you were given. Pain Booklet, Coughing and Deep Breathing, Surgical Site Infection Prevention, Anesthesia Post-op Instructions and Care and Recovery After Surgery      Hysteroscopy Hysteroscopy is a procedure used for looking inside the womb (uterus). It may be done for various reasons, including:  To evaluate abnormal bleeding, fibroid (benign, noncancerous) tumors, polyps, scar tissue (adhesions), and possibly cancer of the uterus.  To look for lumps (tumors) and other uterine growths.  To look for causes of why a woman cannot get pregnant (infertility), causes of recurrent loss of pregnancy (miscarriages), or a lost intrauterine device (IUD).  To perform a sterilization by blocking the fallopian tubes from inside the uterus. In this procedure, a thin,  flexible tube with a tiny light and camera on the end of it (hysteroscope) is used to look inside the uterus. A hysteroscopy should be done right after a menstrual period to be sure you are not pregnant. LET [facility] CARE PROVIDER KNOW ABOUT:   Any allergies you have.  All medicines you are taking, including vitamins, herbs, eye drops, creams, and over-the-counter medicines.  Previous problems you or members of your family have had with the use of anesthetics.  Any blood disorders you have.  Previous surgeries you have had.  Medical conditions you have. RISKS AND COMPLICATIONS  Generally, this is a safe procedure. However, as with any procedure, complications can occur. Possible complications include:  Putting a hole in the uterus.  Excessive bleeding.  Infection.  Damage to the cervix.  Injury to other organs.  Allergic reaction to medicines.  Too much fluid used in the uterus for the procedure. BEFORE THE PROCEDURE   Ask your health care provider about changing or stopping any regular medicines.  Do not take aspirin or blood thinners for 1 week before the procedure, or as directed by your health care provider. These can cause bleeding.  If you smoke, do not smoke for 2 weeks before the procedure.  In some cases, a medicine is placed in the cervix the day before the procedure. This medicine makes the cervix have a larger opening (dilate). This makes it easier for the instrument to be inserted into the  uterus during the procedure.  Do not eat or drink anything for at least 8 hours before the surgery.  Arrange for someone to take you home after the procedure. PROCEDURE   You may be given a medicine to relax you (sedative). You may also be given one of the following:  A medicine that numbs the area around the cervix (local anesthetic).  A medicine that makes you sleep through the procedure (general anesthetic).  The hysteroscope is inserted through the vagina into  the uterus. The camera on the hysteroscope sends a picture to a TV screen. This gives the surgeon a good view inside the uterus.  During the procedure, air or a liquid is put into the uterus, which allows the surgeon to see better.  Sometimes, tissue is gently scraped from inside the uterus. These tissue samples are sent to a lab for testing. AFTER THE PROCEDURE   If you had a general anesthetic, you may be groggy for a couple hours after the procedure.  If you had a local anesthetic, you will be able to go home as soon as you are stable and feel ready.  You may have some cramping. This normally lasts for a couple days.  You may have bleeding, which varies from light spotting for a few days to menstrual-like bleeding for 3-7 days. This is normal.  If your test results are not back during the visit, make an appointment with your health care provider to find out the results.   This information is not intended to replace advice given to you by your health care provider. Make sure you discuss any questions you have with your health care provider.   Document Released: 12/04/2000 Document Revised: 06/18/2013 Document Reviewed: 03/27/2013 Elsevier Interactive Patient Education 2016 [employer]vier [employer]. Dilation and Curettage or Vacuum Curettage Dilation and curettage (D&C) and vacuum curettage are minor procedures. A D&C involves stretching (dilation) the cervix and scraping (curettage) the inside lining of the womb (uterus). During a D&C, tissue is gently scraped from the inside lining of the uterus. During a vacuum curettage, the lining and tissue in the uterus are removed with the use of gentle suction.  Curettage may be performed to either diagnose or treat a problem. As a diagnostic procedure, curettage is performed to examine tissues from the uterus. A diagnostic curettage may be performed for the following symptoms:   Irregular bleeding in the uterus.   Bleeding with the development of clots.    Spotting between menstrual periods.   Prolonged menstrual periods.   Bleeding after menopause.   No menstrual period (amenorrhea).   A change in size and shape of the uterus.  As a treatment procedure, curettage may be performed for the following reasons:   Removal of an IUD (intrauterine device).   Removal of retained placenta after giving birth. Retained placenta can cause an infection or bleeding severe enough to require transfusions.   Abortion.   Miscarriage.   Removal of polyps inside the uterus.   Removal of uncommon types of noncancerous lumps (fibroids).  LET [facility] CARE PROVIDER KNOW ABOUT:   Any allergies you have.   All medicines you are taking, including vitamins, herbs, eye drops, creams, and over-the-counter medicines.   Previous problems you or members of your family have had with the use of anesthetics.   Any blood disorders you have.   Previous surgeries you have had.   Medical conditions you have. RISKS AND COMPLICATIONS  Generally, this is a safe procedure. However, as  with any procedure, complications can occur. Possible complications include:  Excessive bleeding.   Infection of the uterus.   Damage to the cervix.   Development of scar tissue (adhesions) inside the uterus, later causing abnormal amounts of menstrual bleeding.   Complications from the general anesthetic, if a general anesthetic is used.   Putting a hole (perforation) in the uterus. This is rare.  BEFORE THE PROCEDURE   Eat and drink before the procedure only as directed by your health care provider.   Arrange for someone to take you home.  PROCEDURE  This procedure usually takes about 15-30 minutes.  You will be given one of the following:  A medicine that numbs the area in and around the cervix (local anesthetic).   A medicine to make you sleep through the procedure (general anesthetic).  You will lie on your back with your legs in  stirrups.   A warm metal or plastic instrument (speculum) will be placed in your vagina to keep it open and to allow the health care provider to see the cervix.  There are two ways in which your cervix can be softened and dilated. These include:   Taking a medicine.   Having thin rods (laminaria) inserted into your cervix.   A curved tool (curette) will be used to scrape cells from the inside lining of the uterus. In some cases, gentle suction is applied with the curette. The curette will then be removed.  AFTER THE PROCEDURE   You will rest in the recovery area until you are stable and are ready to go home.   You may feel sick to your stomach (nauseous) or throw up (vomit) if you were given a general anesthetic.   You may have a sore throat if a tube was placed in your throat during general anesthesia.   You may have light cramping and bleeding. This may last for 2 days to 2 weeks after the procedure.   Your uterus needs to make a new lining after the procedure. This may make your next period late.   This information is not intended to replace advice given to you by your health care provider. Make sure you discuss any questions you have with your health care provider.   Document Released: 08/28/2005 Document Revised: 04/30/2013 Document Reviewed: 03/27/2013 Elsevier Interactive Patient Education 2016 [place]. Endometrial Ablation Endometrial ablation removes the lining of the uterus (endometrium). It is usually a same-day, outpatient treatment. Ablation helps avoid major surgery, such as surgery to remove the cervix and uterus (hysterectomy). After endometrial ablation, you will have little or no menstrual bleeding and may not be able to have children. However, if you are premenopausal, you will need to use a reliable method of birth control following the procedure because of the small chance that pregnancy can occur. There are different reasons to have this procedure.  These reasons include:  Heavy periods.  Bleeding that is causing anemia.  Irregular bleeding.  Bleeding fibroids on the lining inside the uterus if they are smaller than 3 centimeters. This procedure may not be possible for you if:   You want to have children in the future.   You have severe cramps with your menstrual period.   You have precancerous or cancerous cells in your uterus.   You were recently pregnant.   You have gone through menopause.   You have had major surgery on your uterus, resulting in thinning of the uterine wall. Surgeries may include:  The removal of  one or more uterine fibroids (myomectomy).  A cesarean section with a classic (vertical) incision on your uterus. Ask your health care provider what type of cesarean you had. Sometimes the scar on your skin is different than the scar on your uterus. Even if you have had surgery on your uterus, certain types of ablation may still be safe for you. Talk with your health care provider. LET [facility] CARE PROVIDER KNOW ABOUT:  Any allergies you have.  All medicines you are taking, including vitamins, herbs, eye drops, creams, and over-the-counter medicines.  Previous problems you or members of your family have had with the use of anesthetics.  Any blood disorders you have.  Previous surgeries you have had.  Medical conditions you have. RISKS AND COMPLICATIONS  Generally, this is a safe procedure. However, as with any procedure, complications can occur. Possible complications include:  Perforation of the uterus.  Bleeding.  Infection of the uterus, bladder, or vagina.  Injury to surrounding organs.  An air bubble to the lung (air embolus).  Pregnancy following the procedure.  Failure of the procedure to help the problem, requiring hysterectomy.  Decreased ability to diagnose cancer in the lining of the uterus. BEFORE THE PROCEDURE  The lining of the uterus must be tested to make sure  there is no pre-cancerous or cancer cells present.  An ultrasound may be performed to look at the size of the uterus and to check for abnormalities.  Medicines may be given to thin the lining of the uterus. PROCEDURE  During the procedure, your health care provider will use a tool called a resectoscope to help see inside your uterus. There are different ways to remove the lining of your uterus.   Radiofrequency - This method uses a radiofrequency-alternating electric current to remove the lining of the uterus.  Cryotherapy - This method uses extreme cold to freeze the lining of the uterus.  Heated-Free Liquid - This method uses heated salt (saline) solution to remove the lining of the uterus.  Microwave - This method uses high-energy microwaves to heat up the lining of the uterus to remove it.  Thermal balloon - This method involves inserting a catheter with a balloon tip into the uterus. The balloon tip is filled with heated fluid to remove the lining of the uterus. AFTER THE PROCEDURE  After your procedure, do not have sexual intercourse or insert anything into your vagina until permitted by your health care provider. After the procedure, you may experience:  Cramps.  Vaginal discharge.  Frequent urination.   This information is not intended to replace advice given to you by your health care provider. Make sure you discuss any questions you have with your health care provider.   Document Released: 07/07/2004 Document Revised: 05/19/2015 Document Reviewed: 01/29/2013 Elsevier Interactive Patient Education 2016 [employer] Inc. PATIENT INSTRUCTIONS POST-ANESTHESIA  IMMEDIATELY FOLLOWING SURGERY:  Do not drive or operate machinery for the first twenty four hours after surgery.  Do not make any important decisions for twenty four hours after surgery or while taking narcotic pain medications or sedatives.  If you develop intractable nausea and vomiting or a severe headache please notify  your doctor immediately.  FOLLOW-UP:  Please make an appointment with your surgeon as instructed. You do not need to follow up with anesthesia unless specifically instructed to do so.  WOUND CARE INSTRUCTIONS (if applicable):  Keep a dry clean dressing on the anesthesia/puncture wound site if there is drainage.  Once the wound has quit draining  you may leave it open to air.  Generally you should leave the bandage intact for twenty four hours unless there is drainage.  If the epidural site drains for more than 36-48 hours please call the anesthesia department.  QUESTIONS?:  Please feel free to call your physician or the hospital operator if you have any questions, and they will be happy to assist you.

## 2015-07-02 ENCOUNTER — Encounter (HOSPITAL_COMMUNITY): Payer: Self-pay

## 2015-07-02 ENCOUNTER — Encounter (HOSPITAL_COMMUNITY)
Admission: RE | Admit: 2015-07-02 | Discharge: 2015-07-02 | Disposition: A | Discharge status: Home / Self Care | Payer: Commercial Managed Care - HMO | Source: Ambulatory Visit | Attending: Obstetrics & Gynecology | Admitting: Obstetrics & Gynecology

## 2015-07-02 ENCOUNTER — Other Ambulatory Visit: Payer: Self-pay | Admitting: Obstetrics & Gynecology

## 2015-07-02 DIAGNOSIS — N946 Dysmenorrhea, unspecified: Secondary | ICD-10-CM | POA: Insufficient documentation

## 2015-07-02 DIAGNOSIS — N921 Excessive and frequent menstruation with irregular cycle: Secondary | ICD-10-CM | POA: Diagnosis not present

## 2015-07-02 DIAGNOSIS — Z01818 Encounter for other preprocedural examination: Secondary | ICD-10-CM | POA: Insufficient documentation

## 2015-07-02 HISTORY — DX: Unspecified osteoarthritis, unspecified site: M19.90

## 2015-07-02 HISTORY — DX: Fibromyalgia: M79.7

## 2015-07-02 LAB — COMPREHENSIVE METABOLIC PANEL
ALBUMIN: 4.1 g/dL (ref 3.5–5.0)
ALK PHOS: 78 U/L (ref 38–126)
ALT: 17 U/L (ref 14–54)
ANION GAP: 10 (ref 5–15)
AST: 20 U/L (ref 15–41)
BILIRUBIN TOTAL: 0.7 mg/dL (ref 0.3–1.2)
BUN: 7 mg/dL (ref 6–20)
CALCIUM: 9.1 mg/dL (ref 8.9–10.3)
CO2: 21 mmol/L — ABNORMAL LOW (ref 22–32)
CREATININE: 0.9 mg/dL (ref 0.44–1.00)
Chloride: 109 mmol/L (ref 101–111)
GFR calc Af Amer: >60 mL/min (ref >60)
GFR calc non Af Amer: >60 mL/min (ref >60)
GLUCOSE: 101 mg/dL — AB (ref 65–99)
Potassium: 4.1 mmol/L (ref 3.5–5.1)
Sodium: 140 mmol/L (ref 135–145)
TOTAL PROTEIN: 7 g/dL (ref 6.5–8.1)

## 2015-07-02 LAB — HCG, QUANTITATIVE, PREGNANCY

## 2015-07-02 LAB — CBC
HCT: 39.2 % (ref 36.0–46.0)
HEMOGLOBIN: 12 g/dL (ref 12.0–15.0)
MCH: 20.7 pg — AB (ref 26.0–34.0)
MCHC: 30.6 g/dL (ref 30.0–36.0)
MCV: 67.5 fL — ABNORMAL LOW (ref 78.0–100.0)
Platelets: 397 10*3/uL (ref 150–400)
RBC: 5.81 MIL/uL — AB (ref 3.87–5.11)
WBC: 10 10*3/uL (ref 4.0–10.5)

## 2015-07-07 ENCOUNTER — Ambulatory Visit (HOSPITAL_COMMUNITY): Payer: Commercial Managed Care - HMO | Admitting: Anesthesiology

## 2015-07-07 ENCOUNTER — Encounter (HOSPITAL_COMMUNITY): Payer: Self-pay | Admitting: *Deleted

## 2015-07-07 ENCOUNTER — Encounter (HOSPITAL_COMMUNITY)
Admission: RE | Disposition: A | Discharge status: Home / Self Care | Payer: Self-pay | Source: Ambulatory Visit | Attending: Obstetrics & Gynecology

## 2015-07-07 ENCOUNTER — Ambulatory Visit (HOSPITAL_COMMUNITY)
Admission: RE | Admit: 2015-07-07 | Discharge: 2015-07-07 | Disposition: A | Discharge status: Home / Self Care | Payer: Commercial Managed Care - HMO | Source: Ambulatory Visit | Attending: Obstetrics & Gynecology | Admitting: Obstetrics & Gynecology

## 2015-07-07 DIAGNOSIS — N840 Polyp of corpus uteri: Secondary | ICD-10-CM | POA: Diagnosis not present

## 2015-07-07 DIAGNOSIS — M549 Dorsalgia, unspecified: Secondary | ICD-10-CM | POA: Insufficient documentation

## 2015-07-07 DIAGNOSIS — G709 Myoneural disorder, unspecified: Secondary | ICD-10-CM | POA: Diagnosis not present

## 2015-07-07 DIAGNOSIS — N921 Excessive and frequent menstruation with irregular cycle: Secondary | ICD-10-CM | POA: Diagnosis present

## 2015-07-07 DIAGNOSIS — G8929 Other chronic pain: Secondary | ICD-10-CM | POA: Insufficient documentation

## 2015-07-07 DIAGNOSIS — M797 Fibromyalgia: Secondary | ICD-10-CM | POA: Diagnosis not present

## 2015-07-07 DIAGNOSIS — I509 Heart failure, unspecified: Secondary | ICD-10-CM | POA: Insufficient documentation

## 2015-07-07 DIAGNOSIS — N920 Excessive and frequent menstruation with regular cycle: Secondary | ICD-10-CM | POA: Diagnosis not present

## 2015-07-07 DIAGNOSIS — N946 Dysmenorrhea, unspecified: Secondary | ICD-10-CM | POA: Diagnosis not present

## 2015-07-07 DIAGNOSIS — M1990 Unspecified osteoarthritis, unspecified site: Secondary | ICD-10-CM | POA: Diagnosis not present

## 2015-07-07 DIAGNOSIS — F17210 Nicotine dependence, cigarettes, uncomplicated: Secondary | ICD-10-CM | POA: Insufficient documentation

## 2015-07-07 DIAGNOSIS — K219 Gastro-esophageal reflux disease without esophagitis: Secondary | ICD-10-CM | POA: Diagnosis not present

## 2015-07-07 HISTORY — PX: DILITATION & CURRETTAGE/HYSTROSCOPY WITH NOVASURE ABLATION: SHX5568

## 2015-07-07 HISTORY — PX: POLYPECTOMY: SHX5525

## 2015-07-07 SURGERY — DILATATION & CURETTAGE/HYSTEROSCOPY WITH NOVASURE ABLATION
Anesthesia: General

## 2015-07-07 MED ORDER — KETOROLAC TROMETHAMINE 30 MG/ML IJ SOLN
30.0000 mg | Freq: Once | INTRAMUSCULAR | Status: AC
  Administered 2015-07-07: 30 mg via INTRAVENOUS
  Filled 2015-07-07: qty 1

## 2015-07-07 MED ORDER — HYDROCODONE-ACETAMINOPHEN 5-325 MG PO TABS: 1.0000 | ORAL_TABLET | Freq: Four times a day (QID) | ORAL | Status: DC | PRN

## 2015-07-07 MED ORDER — ONDANSETRON HCL 8 MG PO TABS: 8.0000 mg | ORAL_TABLET | Freq: Three times a day (TID) | ORAL | Status: DC | PRN

## 2015-07-07 MED ORDER — LIDOCAINE HCL (PF) 1 % IJ SOLN
INTRAMUSCULAR | Status: AC
  Filled 2015-07-07: qty 5

## 2015-07-07 MED ORDER — LIDOCAINE HCL (CARDIAC) 20 MG/ML IV SOLN
INTRAVENOUS | Status: DC | PRN
  Administered 2015-07-07: 30 mg via INTRAVENOUS

## 2015-07-07 MED ORDER — LACTATED RINGERS IV SOLN
INTRAVENOUS | Status: DC
  Administered 2015-07-07: 1000 mL via INTRAVENOUS

## 2015-07-07 MED ORDER — FENTANYL CITRATE (PF) 250 MCG/5ML IJ SOLN
INTRAMUSCULAR | Status: DC | PRN
  Administered 2015-07-07 (×2): 50 ug via INTRAVENOUS

## 2015-07-07 MED ORDER — KETOROLAC TROMETHAMINE 10 MG PO TABS: 10.0000 mg | ORAL_TABLET | Freq: Three times a day (TID) | ORAL | Status: DC | PRN

## 2015-07-07 MED ORDER — PROPOFOL 10 MG/ML IV BOLUS
INTRAVENOUS | Status: DC | PRN
  Administered 2015-07-07: 150 mg via INTRAVENOUS
  Administered 2015-07-07: 50 mg via INTRAVENOUS

## 2015-07-07 MED ORDER — FENTANYL CITRATE (PF) 100 MCG/2ML IJ SOLN
25.0000 ug | INTRAMUSCULAR | Status: DC | PRN
  Administered 2015-07-07 (×4): 50 ug via INTRAVENOUS
  Filled 2015-07-07 (×2): qty 2

## 2015-07-07 MED ORDER — CEFAZOLIN SODIUM-DEXTROSE 2-3 GM-% IV SOLR
2.0000 g | INTRAVENOUS | Status: AC
  Administered 2015-07-07: 2 g via INTRAVENOUS
  Filled 2015-07-07: qty 50

## 2015-07-07 MED ORDER — FENTANYL CITRATE (PF) 250 MCG/5ML IJ SOLN
INTRAMUSCULAR | Status: AC
  Filled 2015-07-07: qty 25

## 2015-07-07 MED ORDER — MIDAZOLAM HCL 2 MG/2ML IJ SOLN
1.0000 mg | INTRAMUSCULAR | Status: DC | PRN
  Administered 2015-07-07 (×3): 2 mg via INTRAVENOUS
  Filled 2015-07-07 (×3): qty 2

## 2015-07-07 MED ORDER — ONDANSETRON HCL 4 MG/2ML IJ SOLN: 4.0000 mg | Freq: Once | INTRAMUSCULAR | Status: DC | PRN

## 2015-07-07 MED ORDER — SODIUM CHLORIDE 0.9 % IR SOLN
Status: DC | PRN
  Administered 2015-07-07: 3000 mL

## 2015-07-07 SURGICAL SUPPLY — 22 items
ABLATOR ENDOMETRIAL BIPOLAR (ABLATOR) ×4 IMPLANT
BAG HAMPER (MISCELLANEOUS) ×4 IMPLANT
CLOTH BEACON ORANGE TIMEOUT ST (SAFETY) ×4 IMPLANT
COVER LIGHT HANDLE STERIS (MISCELLANEOUS) ×8 IMPLANT
FORMALIN 10 PREFIL 120ML (MISCELLANEOUS) ×4 IMPLANT
GLOVE BIOGEL PI IND STRL 7.0 (GLOVE) ×6 IMPLANT
GLOVE BIOGEL PI IND STRL 8 (GLOVE) ×2 IMPLANT
GLOVE BIOGEL PI INDICATOR 7.0 (GLOVE) ×6
GLOVE BIOGEL PI INDICATOR 8 (GLOVE) ×2
GLOVE ECLIPSE 6.5 STRL STRAW (GLOVE) ×4 IMPLANT
GLOVE ECLIPSE 8.0 STRL XLNG CF (GLOVE) ×4 IMPLANT
GOWN STRL REUS W/TWL LRG LVL3 (GOWN DISPOSABLE) ×4 IMPLANT
GOWN STRL REUS W/TWL XL LVL3 (GOWN DISPOSABLE) ×4 IMPLANT
INST SET HYSTEROSCOPY (KITS) ×4 IMPLANT
IV NS IRRIG 3000ML ARTHROMATIC (IV SOLUTION) ×4 IMPLANT
KIT ROOM TURNOVER AP CYSTO (KITS) ×4 IMPLANT
MANIFOLD NEPTUNE II (INSTRUMENTS) ×4 IMPLANT
PACK PERI GYN (CUSTOM PROCEDURE TRAY) ×4 IMPLANT
PAD ARMBOARD 7.5X6 YLW CONV (MISCELLANEOUS) ×4 IMPLANT
PAD TELFA 3X4 1S STER (GAUZE/BANDAGES/DRESSINGS) ×8 IMPLANT
SET BASIN LINEN APH (SET/KITS/TRAYS/PACK) ×4 IMPLANT
SET IRRIG Y TYPE TUR BLADDER L (SET/KITS/TRAYS/PACK) ×4 IMPLANT

## 2015-07-07 NOTE — Discharge Instructions (Signed)
Endometrial Ablation °Endometrial ablation removes the lining of the uterus (endometrium). It is usually a same-day, outpatient treatment. Ablation helps avoid major surgery, such as surgery to remove the cervix and uterus (hysterectomy). After endometrial ablation, you will have little or no menstrual bleeding and may not be able to have children. However, if you are premenopausal, you will need to use a reliable method of birth control following the procedure because of the small chance that pregnancy can occur. °There are different reasons to have this procedure. These reasons include: °· Heavy periods. °· Bleeding that is causing anemia. °· Irregular bleeding. °· Bleeding fibroids on the lining inside the uterus if they are smaller than 3 centimeters. °This procedure may not be possible for you if:  °· You want to have children in the future.   °· You have severe cramps with your menstrual period.   °· You have precancerous or cancerous cells in your uterus.   °· You were recently pregnant.   °· You have gone through menopause.   °· You have had major surgery on your uterus, resulting in thinning of the uterine wall. Surgeries may include: °¨ The removal of one or more uterine fibroids (myomectomy). °¨ A cesarean section with a classic (vertical) incision on your uterus. Ask your health care provider what type of cesarean you had. Sometimes the scar on your skin is different than the scar on your uterus. °Even if you have had surgery on your uterus, certain types of ablation may still be safe for you. Talk with your health care provider. °LET YOUR HEALTH CARE PROVIDER KNOW ABOUT: °· Any allergies you have. °· All medicines you are taking, including vitamins, herbs, eye drops, creams, and over-the-counter medicines. °· Previous problems you or members of your family have had with the use of anesthetics. °· Any blood disorders you have. °· Previous surgeries you have had. °· Medical conditions you have. °RISKS AND  COMPLICATIONS  °Generally, this is a safe procedure. However, as with any procedure, complications can occur. Possible complications include: °· Perforation of the uterus. °· Bleeding. °· Infection of the uterus, bladder, or vagina. °· Injury to surrounding organs. °· An air bubble to the lung (air embolus). °· Pregnancy following the procedure. °· Failure of the procedure to help the problem, requiring hysterectomy. °· Decreased ability to diagnose cancer in the lining of the uterus. °BEFORE THE PROCEDURE °· The lining of the uterus must be tested to make sure there is no pre-cancerous or cancer cells present. °· An ultrasound may be performed to look at the size of the uterus and to check for abnormalities. °· Medicines may be given to thin the lining of the uterus. °PROCEDURE  °During the procedure, your health care provider will use a tool called a resectoscope to help see inside your uterus. There are different ways to remove the lining of your uterus.  °· Radiofrequency - This method uses a radiofrequency-alternating electric current to remove the lining of the uterus. °· Cryotherapy - This method uses extreme cold to freeze the lining of the uterus. °· Heated-Free Liquid - This method uses heated salt (saline) solution to remove the lining of the uterus. °· Microwave - This method uses high-energy microwaves to heat up the lining of the uterus to remove it. °· Thermal balloon - This method involves inserting a catheter with a balloon tip into the uterus. The balloon tip is filled with heated fluid to remove the lining of the uterus. °AFTER THE PROCEDURE  °After your procedure, do   not have sexual intercourse or insert anything into your vagina until permitted by your health care provider. After the procedure, you may experience: °· Cramps. °· Vaginal discharge. °· Frequent urination. °  °This information is not intended to replace advice given to you by your health care provider. Make sure you discuss any  questions you have with your health care provider. °  °Document Released: 07/07/2004 Document Revised: 05/19/2015 Document Reviewed: 01/29/2013 °Elsevier Interactive Patient Education ©2016 Elsevier Inc. ° °

## 2015-07-07 NOTE — Op Note (Signed)
Preoperative diagnosis: Menometrorrhagia                                        Dysmenorrhea                                        Endometrial polyp   Postoperative diagnoses: Same as above   Procedure: Hysteroscopy, uterine curettage, endometrial ablation using Novasure  Surgeon: Florian Buff   Anesthesia: Laryngeal mask airway  Findings: The endometrium was significant for small endometrial polyp. There were no fibroid or other abnormalities.  Description of operation: The patient was taken to the operating room and placed in the supine position. She underwent general anesthesia using the laryngeal mask airway. She was placed in the dorsal lithotomy position and prepped and draped in the usual sterile fashion. A Graves speculum was placed and the anterior cervical lip was grasped with a single-tooth tenaculum. The cervix was dilated serially to allow passage of the hysteroscope. Diagnostic hysteroscopy was performed and was found to be normal. A vigorous uterine curettage was then performed and all tissue sent to pathology for evaluation.  I then proceeded to perform the Novasure endometrial ablation.  The cervical length was 3.0. The uterus sounded to  9.5 cm yielding a net length of 6.5 cm.  The endometrial cavity was 4.5 cm wide. The power was 161 watts.  The total time of therapy was 1 min 19 seconds. The array was evaluated after the procedure and tissue was adherent on all the dimensions of the surface, confirming fundal treatment as well.    All of the equipment worked well throughout the procedure.  The patient was awakened from anesthesia and taken to the recovery room in good stable condition all counts were correct. She received 2 g of Ancef and 30 mg of Toradol preoperatively. She will be discharged from the recovery room and followed up in the office in 1- 2 weeks.  Lynn Logan H 07/07/2015 10:52 AM

## 2015-07-07 NOTE — Anesthesia Procedure Notes (Signed)
Procedure Name: LMA Insertion Date/Time: 07/07/2015 10:23 AM Performed by: Tressie Stalker E Pre-anesthesia Checklist: Patient identified, Patient being monitored, Emergency Drugs available, Timeout performed and Suction available Patient Re-evaluated:Patient Re-evaluated prior to inductionOxygen Delivery Method: Circle System Utilized Preoxygenation: Pre-oxygenation with 100% oxygen Intubation Type: IV induction Ventilation: Mask ventilation without difficulty LMA: LMA inserted LMA Size: 3.0 Number of attempts: 1 Placement Confirmation: positive ETCO2 and breath sounds checked- equal and bilateral

## 2015-07-07 NOTE — H&P (Signed)
Preoperative History and Physical  Lynn Logan is a 44 y.o. No obstetric history on file. with Patient's last menstrual period was 06/18/2015. admitted for a hSubjective Worsening menstrual cycles over the past few years especially bad the last 2 months 10-12 days of bleeding Sonogram revealed and endometrial polyp  Pt desires hysteroscopy uterine curettage endometrial ablation for management   PMH:    Past Medical History  Diagnosis Date  . CHF (congestive heart failure) ([place])   . Anxiety   . Depression   . Chronic back pain   . Right lumbar radiculopathy   . Fibromyalgia   . Arthritis     Rheumatoid    PSH:     Past Surgical History  Procedure Laterality Date  . Tubal ligation    . Cesarean section      POb/GynH:      OB History    No data available      SH:   Social History  Substance Use Topics  . Smoking status: Current Every Day Smoker -- 1.00 packs/day for 30 years    Types: Cigarettes  . Smokeless tobacco: Former Systems developer  . Alcohol Use: No    FH:    Family History  Problem Relation Age of Onset  . Diabetes Mother   . Congestive Heart Failure Mother   . Depression Father   . Hypertension Father   . Cancer Father   . Heart disease Maternal Uncle      Allergies: No Known Allergies  Medications:       Current facility-administered medications:  .  ceFAZolin (ANCEF) IVPB 2 g/50 mL premix, 2 g, Intravenous, On Call to OR, Florian Buff, MD .  lactated ringers infusion, , Intravenous, Continuous, Rusty Aus, MD, Last Rate: 75 mL/hr at 07/07/15 0909, 1,000 mL at 07/07/15 0909 .  midazolam (VERSED) injection 1-2 mg, 1-2 mg, Intravenous, Q5 min PRN, Rusty Aus, MD, 2 mg at 07/07/15 8891  Review of Systems:   Review of Systems  Constitutional: Negative for fever, chills, weight loss, malaise/fatigue and diaphoresis.  HENT: Negative for hearing loss, ear pain, nosebleeds, congestion, sore throat, neck pain, tinnitus and ear  discharge.   Eyes: Negative for blurred vision, double vision, photophobia, pain, discharge and redness.  Respiratory: Negative for cough, hemoptysis, sputum production, shortness of breath, wheezing and stridor.   Cardiovascular: Negative for chest pain, palpitations, orthopnea, claudication, leg swelling and PND.  Gastrointestinal: Positive for abdominal pain. Negative for heartburn, nausea, vomiting, diarrhea, constipation, blood in stool and melena.  Genitourinary: Negative for dysuria, urgency, frequency, hematuria and flank pain.  Musculoskeletal: Negative for myalgias, back pain, joint pain and falls.  Skin: Negative for itching and rash.  Neurological: Negative for dizziness, tingling, tremors, sensory change, speech change, focal weakness, seizures, loss of consciousness, weakness and headaches.  Endo/Heme/Allergies: Negative for environmental allergies and polydipsia. Does not bruise/bleed easily.  Psychiatric/Behavioral: Negative for depression, suicidal ideas, hallucinations, memory loss and substance abuse. The patient is not nervous/anxious and does not have insomnia.      PHYSICAL EXAM:  Blood pressure 118/75, pulse 76, temperature 97.9 F (36.6 C), temperature source Oral, resp. rate 17, height 5\' 2"  (1.575 m), weight 183 lb (83.008 kg), last menstrual period 06/18/2015, SpO2 100 %.    Vitals reviewed. Constitutional: She is oriented to person, place, and time. She appears well-developed and well-nourished.  HENT:  Head: Normocephalic and atraumatic.  Right Ear: External ear normal.  Left Ear: External ear normal.  Nose:  Nose normal.  Mouth/Throat: Oropharynx is clear and moist.  Eyes: Conjunctivae and EOM are normal. Pupils are equal, round, and reactive to light. Right eye exhibits no discharge. Left eye exhibits no discharge. No scleral icterus.  Neck: Normal range of motion. Neck supple. No tracheal deviation present. No thyromegaly present.  Cardiovascular: Normal  rate, regular rhythm, normal heart sounds and intact distal pulses.  Exam reveals no gallop and no friction rub.   No murmur heard. Respiratory: Effort normal and breath sounds normal. No respiratory distress. She has no wheezes. She has no rales. She exhibits no tenderness.  GI: Soft. Bowel sounds are normal. She exhibits no distension and no mass. There is tenderness. There is no rebound and no guarding.  Genitourinary:       Vulva is normal without lesions Vagina is pink moist without discharge Cervix normal in appearance and pap is normal Uterus is normal size, contour, position, consistency, mobility, non-tender Adnexa is negative with normal sized ovaries by sonogram  Musculoskeletal: Normal range of motion. She exhibits no edema and no tenderness.  Neurological: She is alert and oriented to person, place, and time. She has normal reflexes. She displays normal reflexes. No cranial nerve deficit. She exhibits normal muscle tone. Coordination normal.  Skin: Skin is warm and dry. No rash noted. No erythema. No pallor.  Psychiatric: She has a normal mood and affect. Her behavior is normal. Judgment and thought content normal.    Labs: Results for orders placed or performed during the hospital encounter of 07/02/15 (from the past 336 hour(s))  CBC   Collection Time: 07/02/15  2:10 PM  Result Value Ref Range   WBC 10.0 4.0 - 10.5 K/uL   RBC 5.81 (H) 3.87 - 5.11 MIL/uL   Hemoglobin 12.0 12.0 - 15.0 g/dL   HCT 39.2 36.0 - 46.0 %   MCV 67.5 (L) 78.0 - 100.0 fL   MCH 20.7 (L) 26.0 - 34.0 pg   MCHC 30.6 30.0 - 36.0 g/dL   Platelets 397 150 - 400 K/uL  Comprehensive metabolic panel   Collection Time: 07/02/15  2:10 PM  Result Value Ref Range   Sodium 140 135 - 145 mmol/L   Potassium 4.1 3.5 - 5.1 mmol/L   Chloride 109 101 - 111 mmol/L   CO2 21 (L) 22 - 32 mmol/L   Glucose, Bld 101 (H) 65 - 99 mg/dL   BUN 7 6 - 20 mg/dL   Creatinine, Ser 0.90 0.44 - 1.00 mg/dL   Calcium 9.1 8.9 - 10.3  mg/dL   Total Protein 7.0 6.5 - 8.1 g/dL   Albumin 4.1 3.5 - 5.0 g/dL   AST 20 15 - 41 U/L   ALT 17 14 - 54 U/L   Alkaline Phosphatase 78 38 - 126 U/L   Total Bilirubin 0.7 0.3 - 1.2 mg/dL   GFR calc non Af Amer >60 >60 mL/min   GFR calc Af Amer >60 >60 mL/min   Anion gap 10 5 - 15  hCG, quantitative, pregnancy   Collection Time: 07/02/15  2:10 PM  Result Value Ref Range   hCG, Beta Chain, Quant, S <1 <5 mIU/mL    EKG: Orders placed or performed during the hospital encounter of 06/21/14  . EKG 12-Lead  . EKG 12-Lead  . ED EKG  . ED EKG  . EKG    Imaging Studies: US Transvaginal Non-ob  06/24/2015 GYNECOLOGIC SONOGRAM AVREY FLANAGIN is a 44 y.o. LMP 05/19/2015 for a pelvic sonogram for  dysmenorrhea and menometrorrhagia.Marland Kitchen Uterus  12.15 x 6.2 x 5.29 cm, anteverted uterus with a 1.5 x 1 x 1.4 cm submucosal fibroid Endometrium 4.9 mm, asymmetrical, 1.9 x 1.7 x 1.4 cm polyp Right ovary  4.2 x 3.2 x 2.0 cm, wnl Left ovary 2.8 x 2.3 x 2.1 cm, wnl Technician Comments: PELVIC US TA/TV: anteverted uterus with a 1.5 x 1 x 1.4 cm submucosal fibroid,EEC 4.9 mm with a 1.9 x 1.7 x 1.4 cm polyp,normal (mobile) ov's bilat,no free fluid seen [employer] 05/31/2015 2:57 PM Clinical Impression and recommendations: I have reviewed the sonogram results above, combined with the patient's current clinical course, below are my impressions and any appropriate recommendations for management based on the sonographic findings. Endometrium with small submucosal myoma and a large polyp Uterus generous sized but normal Ovaries normal, no cysts EURE,LUTHER H 05/31/2015 2:58 PM   US Pelvis Complete  05/31/2015 GYNECOLOGIC SONOGRAM HEMA LANZA is a 44 y.o. LMP 05/19/2015 for a pelvic sonogram for dysmenorrhea and menometrorrhagia.Marland Kitchen Uterus  12.15 x 6.2 x 5.29 cm, anteverted uterus with a 1.5 x 1 x 1.4 cm  submucosal fibroid Endometrium 4.9 mm, asymmetrical, 1.9 x 1.7 x 1.4 cm polyp Right ovary  4.2 x 3.2 x 2.0 cm, wnl Left ovary 2.8 x 2.3 x 2.1 cm, wnl Technician Comments: PELVIC US TA/TV: anteverted uterus with a 1.5 x 1 x 1.4 cm submucosal fibroid,EEC 4.9 mm with a 1.9 x 1.7 x 1.4 cm polyp,normal (mobile) ov's bilat,no free fluid seen [employer] 05/31/2015 2:57 PM Clinical Impression and recommendations: I have reviewed the sonogram results above, combined with the patient's current clinical course, below are my impressions and any appropriate recommendations for management based on the sonographic findings. Endometrium with small submucosal myoma and a large polyp Uterus generous sized but normal Ovaries normal, no cysts EURE,LUTHER H 05/31/2015 2:58 PM      Assessment: Menometrorrhagia Dysmenorrhea Endometrial polyp Patient Active Problem List   Diagnosis Date Noted  . Right arm pain 06/24/2015  . Right leg pain 06/24/2015  . Back pain 06/11/2015  . Menorrhagia 05/21/2015  . Mallory-Weiss tear 05/21/2015  . Peripheral neuropathy ([place]) 05/21/2015  . Depression 05/21/2015    Plan: Hysteroscopy uterine curettage removal of endometrial polyp endometrial ablation using NovaSure  EURE,LUTHER H 07/07/2015 9:44 AM

## 2015-07-07 NOTE — Anesthesia Postprocedure Evaluation (Signed)
  Anesthesia Post-op Note  Patient: Lynn Logan  Procedure(s) Performed: Procedure(s): HYSTEROSCOPY, UTERINE CURETTAGE, ENDOMETRIAL  ABLATION Uterine Cavity Length=6.5cm Uterine Cavity Width=4.5cm Power=161 Watts Time=1 minute 19 seconds (N/A) POLYPECTOMY  Patient Location: Short Stay  Anesthesia Type:General  Level of Consciousness: awake, alert  and oriented  Airway and Oxygen Therapy: Patient Spontanous Breathing  Post-op Pain: none  Post-op Assessment: Post-op Vital signs reviewed, Patient's Cardiovascular Status Stable, Respiratory Function Stable, Patent Airway and No signs of Nausea or vomiting              Post-op Vital Signs: Reviewed and stable  Last Vitals:  Filed Vitals:   07/07/15 1210  BP: 131/69  Pulse:   Temp: 36.2 C  Resp: 18    Complications: No apparent anesthesia complications

## 2015-07-07 NOTE — Transfer of Care (Signed)
Immediate Anesthesia Transfer of Care Note  Patient: Lynn Logan  Procedure(s) Performed: Procedure(s): HYSTEROSCOPY, UTERINE CURETTAGE, ENDOMETRIAL  ABLATION (N/A) POLYPECTOMY  Patient Location: PACU  Anesthesia Type:General  Level of Consciousness: awake, alert  and oriented  Airway & Oxygen Therapy: Patient Spontanous Breathing  Post-op Assessment: Report given to RN  Post vital signs: Reviewed and stable  Last Vitals:  Filed Vitals:   07/07/15 1005  BP: 120/70  Pulse:   Temp:   Resp: 21    Complications: No apparent anesthesia complications

## 2015-07-07 NOTE — Anesthesia Preprocedure Evaluation (Addendum)
Anesthesia Evaluation  Patient identified by MRN, date of birth, ID band Patient awake    Reviewed: Allergy & Precautions, NPO status , Patient's Chart, lab work & pertinent test results  History of Anesthesia Complications Negative for: history of anesthetic complications  Airway Mallampati: II  TM Distance: >3 FB Neck ROM: Full    Dental  (+) Teeth Intact   Pulmonary Current Smoker,    Pulmonary exam normal        Cardiovascular negative cardio ROS Normal cardiovascular exam     Neuro/Psych Anxiety Depression  Neuromuscular disease    GI/Hepatic Neg liver ROS, GERD  Medicated and Controlled,  Endo/Other  negative endocrine ROS  Renal/GU negative Renal ROS  negative genitourinary   Musculoskeletal  (+) Arthritis , Osteoarthritis,  Fibromyalgia -  Abdominal Normal abdominal exam  (+) + obese,   Peds  Hematology negative hematology ROS (+)   Anesthesia Other Findings   Reproductive/Obstetrics negative OB ROS                            Anesthesia Physical Anesthesia Plan  ASA: II  Anesthesia Plan: General   Post-op Pain Management:    Induction: Intravenous  Airway Management Planned: LMA  Additional Equipment:   Intra-op Plan:   Post-operative Plan: Extubation in OR  Informed Consent: I have reviewed the patients History and Physical, chart, labs and discussed the procedure including the risks, benefits and alternatives for the proposed anesthesia with the patient or authorized representative who has indicated his/her understanding and acceptance.   Dental advisory given  Plan Discussed with: CRNA  Anesthesia Plan Comments:        Anesthesia Quick Evaluation

## 2015-07-08 ENCOUNTER — Encounter (HOSPITAL_COMMUNITY): Payer: Self-pay | Admitting: Obstetrics & Gynecology

## 2015-07-08 ENCOUNTER — Telehealth: Payer: Self-pay | Admitting: Obstetrics & Gynecology

## 2015-07-08 MED ORDER — IBUPROFEN 800 MG PO TABS
800.0000 mg | ORAL_TABLET | Freq: Three times a day (TID) | ORAL | Status: DC | PRN
Start: 2015-07-08 — End: 2015-07-14

## 2015-07-08 NOTE — Telephone Encounter (Signed)
Spoke with Dr. Glo Herring and he gave a verbal order for ibuprofen 800mg  every 8 hours disp 30 tabs with 1 refill. Rx was sent to pharmacy and I will notify the pt of this.

## 2015-07-08 NOTE — Telephone Encounter (Signed)
Pt states that she is having really bad cramps since she had surgery yesterday. Pt states that she had a polypectomy and a D&C.

## 2015-07-09 ENCOUNTER — Telehealth: Payer: Self-pay | Admitting: *Deleted

## 2015-07-09 NOTE — Telephone Encounter (Signed)
Spoke with pt letting her know that abd pain is normal after an endo ablation. Pt was advised to take meds as directed that was prescribed. Pt voiced understanding. Dr. Elonda Husky reviewed message. [facility]

## 2015-07-09 NOTE — Telephone Encounter (Signed)
Pt aware Ibuprofen was called in and she informed me that she was told not to take that with the other two medications. I advised the pt to just take the toradol and the hydrocodone. Pt verbalized understanding

## 2015-07-10 NOTE — Progress Notes (Signed)
Patient ID: Lynn Logan, female   DOB: [birth year], 44 y.o.   MRN: [id number] Preoperative History and Physical  MARIANGELA HELDT is a 44 y.o. No obstetric history on file. with Patient's last menstrual period was 06/18/2015. admitted for a hysteroscopy uterine curettage and endometrial ablation for ongoing menorrhagia metrorrhagia despite Megace therapy.  Sonogram reveals a polyp otherwise a normal pelvic ultrasound  PMH:    Past Medical History  Diagnosis Date  . CHF (congestive heart failure) ([place])   . Anxiety   . Depression   . Chronic back pain   . Right lumbar radiculopathy   . Fibromyalgia   . Arthritis     Rheumatoid    PSH:     Past Surgical History  Procedure Laterality Date  . Tubal ligation    . Cesarean section    . Dilitation & currettage/hystroscopy with novasure ablation N/A 07/07/2015    Procedure: HYSTEROSCOPY, UTERINE CURETTAGE, ENDOMETRIAL  ABLATION Uterine Cavity Length=6.5cm Uterine Cavity Width=4.5cm Power=161 Watts Time=1 minute 19 seconds;  Surgeon: Florian Buff, MD;  Location: [facility];  Service: Gynecology;  Laterality: N/A;  . Polypectomy  07/07/2015    Procedure: POLYPECTOMY;  Surgeon: Florian Buff, MD;  Location: [facility];  Service: Gynecology;;    POb/GynH:      OB History    No data available      SH:   Social History  Substance Use Topics  . Smoking status: Current Every Day Smoker -- 1.00 packs/day for 30 years    Types: Cigarettes  . Smokeless tobacco: Former Systems developer  . Alcohol Use: No    FH:    Family History  Problem Relation Age of Onset  . Diabetes Mother   . Congestive Heart Failure Mother   . Depression Father   . Hypertension Father   . Cancer Father   . Heart disease Maternal Uncle      Allergies: No Known Allergies  Medications:       Current outpatient prescriptions:  .  diclofenac sodium (VOLTAREN) 1 % GEL, Apply 2 g topically 4 (four) times daily., Disp: 100 g, Rfl: 2 .  escitalopram (LEXAPRO) 10 MG tablet,  Take 10 mg by mouth daily. , Disp: , Rfl:  .  ferrous sulfate 325 (65 FE) MG tablet, Take 1 tablet (325 mg total) by mouth 2 (two) times daily with a meal., Disp: 60 tablet, Rfl: 3 .  gabapentin (NEURONTIN) 300 MG capsule, Take 1 capsule (300 mg total) by mouth 3 (three) times daily., Disp: 90 capsule, Rfl: 2 .  GAVILYTE-G 236 G solution, , Disp: , Rfl:  .  LINZESS 145 MCG CAPS capsule, Take 145 mcg by mouth daily. , Disp: , Rfl:  .  megestrol (MEGACE) 40 MG tablet, 3 tablets a day for 5 days, 2 tablets a day for 5 days then 1 tablet daily, Disp: 45 tablet, Rfl: 3 .  nitroGLYCERIN (NITROSTAT) 0.4 MG SL tablet, Place 0.4 mg under the tongue every 5 (five) minutes as needed for chest pain., Disp: , Rfl:  .  omeprazole (PRILOSEC) 20 MG capsule, Take 1 capsule (20 mg total) by mouth daily. 1 tablet a day, Disp: 30 capsule, Rfl: 6 .  vitamin B-12 (CYANOCOBALAMIN) 500 MCG tablet, Take 500 mcg by mouth daily. , Disp: , Rfl:  .  HYDROcodone-acetaminophen (NORCO/VICODIN) 5-325 MG tablet, Take 1 tablet by mouth every 6 (six) hours as needed., Disp: 30 tablet, Rfl: 0 .  ibuprofen (ADVIL,MOTRIN) 800 MG tablet, Take 1  tablet (800 mg total) by mouth every 8 (eight) hours as needed., Disp: 30 tablet, Rfl: 1 .  ketorolac (TORADOL) 10 MG tablet, Take 1 tablet (10 mg total) by mouth every 8 (eight) hours as needed., Disp: 15 tablet, Rfl: 0 .  ondansetron (ZOFRAN) 8 MG tablet, Take 1 tablet (8 mg total) by mouth every 8 (eight) hours as needed for nausea., Disp: 12 tablet, Rfl: 0 .  traMADol-acetaminophen (ULTRACET) 37.5-325 MG tablet, Take 1 tablet by mouth every 6 (six) hours as needed for moderate pain. , Disp: , Rfl:   Review of Systems:   Review of Systems  Constitutional: Negative for fever, chills, weight loss, malaise/fatigue and diaphoresis.  HENT: Negative for hearing loss, ear pain, nosebleeds, congestion, sore throat, neck pain, tinnitus and ear discharge.   Eyes: Negative for blurred vision, double  vision, photophobia, pain, discharge and redness.  Respiratory: Negative for cough, hemoptysis, sputum production, shortness of breath, wheezing and stridor.   Cardiovascular: Negative for chest pain, palpitations, orthopnea, claudication, leg swelling and PND.  Gastrointestinal: Positive for abdominal pain. Negative for heartburn, nausea, vomiting, diarrhea, constipation, blood in stool and melena.  Genitourinary: Negative for dysuria, urgency, frequency, hematuria and flank pain.  Musculoskeletal: Negative for myalgias, back pain, joint pain and falls.  Skin: Negative for itching and rash.  Neurological: Negative for dizziness, tingling, tremors, sensory change, speech change, focal weakness, seizures, loss of consciousness, weakness and headaches.  Endo/Heme/Allergies: Negative for environmental allergies and polydipsia. Does not bruise/bleed easily.  Psychiatric/Behavioral: Negative for depression, suicidal ideas, hallucinations, memory loss and substance abuse. The patient is not nervous/anxious and does not have insomnia.      PHYSICAL EXAM:  Blood pressure 134/74, pulse 64, weight 184 lb (83.462 kg), last menstrual period 06/18/2015.    Vitals reviewed. Constitutional: She is oriented to person, place, and time. She appears well-developed and well-nourished.  HENT:  Head: Normocephalic and atraumatic.  Right Ear: External ear normal.  Left Ear: External ear normal.  Nose: Nose normal.  Mouth/Throat: Oropharynx is clear and moist.  Eyes: Conjunctivae and EOM are normal. Pupils are equal, round, and reactive to light. Right eye exhibits no discharge. Left eye exhibits no discharge. No scleral icterus.  Neck: Normal range of motion. Neck supple. No tracheal deviation present. No thyromegaly present.  Cardiovascular: Normal rate, regular rhythm, normal heart sounds and intact distal pulses.  Exam reveals no gallop and no friction rub.   No murmur heard. Respiratory: Effort normal and  breath sounds normal. No respiratory distress. She has no wheezes. She has no rales. She exhibits no tenderness.  GI: Soft. Bowel sounds are normal. She exhibits no distension and no mass. There is tenderness. There is no rebound and no guarding.  Genitourinary:       Vulva is normal without lesions Vagina is pink moist without discharge Cervix normal in appearance and pap is normal Uterus is normal size, contour, position, consistency, mobility, non-tender Adnexa is negative with normal sized ovaries by sonogram  Musculoskeletal: Normal range of motion. She exhibits no edema and no tenderness.  Neurological: She is alert and oriented to person, place, and time. She has normal reflexes. She displays normal reflexes. No cranial nerve deficit. She exhibits normal muscle tone. Coordination normal.  Skin: Skin is warm and dry. No rash noted. No erythema. No pallor.  Psychiatric: She has a normal mood and affect. Her behavior is normal. Judgment and thought content normal.    Labs: Results for orders placed or performed during  the hospital encounter of 07/02/15 (from the past 336 hour(s))  CBC   Collection Time: 07/02/15  2:10 PM  Result Value Ref Range   WBC 10.0 4.0 - 10.5 K/uL   RBC 5.81 (H) 3.87 - 5.11 MIL/uL   Hemoglobin 12.0 12.0 - 15.0 g/dL   HCT 39.2 36.0 - 46.0 %   MCV 67.5 (L) 78.0 - 100.0 fL   MCH 20.7 (L) 26.0 - 34.0 pg   MCHC 30.6 30.0 - 36.0 g/dL   Platelets 397 150 - 400 K/uL  Comprehensive metabolic panel   Collection Time: 07/02/15  2:10 PM  Result Value Ref Range   Sodium 140 135 - 145 mmol/L   Potassium 4.1 3.5 - 5.1 mmol/L   Chloride 109 101 - 111 mmol/L   CO2 21 (L) 22 - 32 mmol/L   Glucose, Bld 101 (H) 65 - 99 mg/dL   BUN 7 6 - 20 mg/dL   Creatinine, Ser 0.90 0.44 - 1.00 mg/dL   Calcium 9.1 8.9 - 10.3 mg/dL   Total Protein 7.0 6.5 - 8.1 g/dL   Albumin 4.1 3.5 - 5.0 g/dL   AST 20 15 - 41 U/L   ALT 17 14 - 54 U/L   Alkaline Phosphatase 78 38 - 126 U/L   Total  Bilirubin 0.7 0.3 - 1.2 mg/dL   GFR calc non Af Amer >60 >60 mL/min   GFR calc Af Amer >60 >60 mL/min   Anion gap 10 5 - 15  hCG, quantitative, pregnancy   Collection Time: 07/02/15  2:10 PM  Result Value Ref Range   hCG, Beta Chain, Quant, S <1 <5 mIU/mL    EKG: Orders placed or performed during the hospital encounter of 06/21/14  . EKG 12-Lead  . EKG 12-Lead  . ED EKG  . ED EKG  . EKG    Imaging Studies: No results found.    Assessment: Patient Active Problem List   Diagnosis Date Noted  . Right arm pain 06/24/2015  . Right leg pain 06/24/2015  . Back pain 06/11/2015  . Menorrhagia 05/21/2015  . Mallory-Weiss tear 05/21/2015  . Peripheral neuropathy ([place]) 05/21/2015  . Depression 05/21/2015    Plan: Hysteroscopy D&C endometrial ablation Patient her stance and alternatives but she has not responded to megestrol wants to continue with definitive therapy  EURE,LUTHER H

## 2015-07-12 ENCOUNTER — Ambulatory Visit: Payer: Commercial Managed Care - HMO | Admitting: Family Medicine

## 2015-07-14 ENCOUNTER — Ambulatory Visit (INDEPENDENT_AMBULATORY_CARE_PROVIDER_SITE_OTHER): Payer: Commercial Managed Care - HMO | Admitting: Obstetrics & Gynecology

## 2015-07-14 ENCOUNTER — Encounter: Payer: Self-pay | Admitting: Obstetrics & Gynecology

## 2015-07-14 VITALS — BP 120/80 | HR 112 | Wt 184.0 lb

## 2015-07-14 DIAGNOSIS — Z9889 Other specified postprocedural states: Secondary | ICD-10-CM

## 2015-07-14 MED ORDER — HYDROCODONE-ACETAMINOPHEN 5-325 MG PO TABS: 1.0000 | ORAL_TABLET | Freq: Four times a day (QID) | ORAL | Status: DC | PRN

## 2015-07-14 NOTE — Progress Notes (Signed)
Patient ID: Lynn Logan, female   DOB: 1971-07-14, 44 y.o.   MRN: [id number]  HPI: Patient returns for routine postoperative follow-up having undergone hysteroscopy uterine curettage endometrial ablation on 07/07/2015.  The patient's immediate postoperative recovery has been unremarkable. Since hospital discharge the patient reports watery discharge.   Current Outpatient Prescriptions: diclofenac sodium (VOLTAREN) 1 % GEL, Apply 2 g topically 4 (four) times daily., Disp: 100 g, Rfl: 2 escitalopram (LEXAPRO) 10 MG tablet, Take 10 mg by mouth daily. , Disp: , Rfl:  ferrous sulfate 325 (65 FE) MG tablet, Take 1 tablet (325 mg total) by mouth 2 (two) times daily with a meal., Disp: 60 tablet, Rfl: 3 gabapentin (NEURONTIN) 300 MG capsule, Take 1 capsule (300 mg total) by mouth 3 (three) times daily., Disp: 90 capsule, Rfl: 2 HYDROcodone-acetaminophen (NORCO/VICODIN) 5-325 MG tablet, Take 1 tablet by mouth every 6 (six) hours as needed., Disp: 30 tablet, Rfl: 0 ketorolac (TORADOL) 10 MG tablet, Take 1 tablet (10 mg total) by mouth every 8 (eight) hours as needed., Disp: 15 tablet, Rfl: 0 LINZESS 145 MCG CAPS capsule, Take 145 mcg by mouth daily. , Disp: , Rfl:  omeprazole (PRILOSEC) 20 MG capsule, Take 1 capsule (20 mg total) by mouth daily. 1 tablet a day, Disp: 30 capsule, Rfl: 6 ondansetron (ZOFRAN) 8 MG tablet, Take 1 tablet (8 mg total) by mouth every 8 (eight) hours as needed for nausea., Disp: 12 tablet, Rfl: 0 vitamin B-12 (CYANOCOBALAMIN) 500 MCG tablet, Take 500 mcg by mouth daily. , Disp: , Rfl:   No current facility-administered medications for this visit.    Blood pressure 120/80, pulse 112, weight 184 lb (83.462 kg), last menstrual period 06/18/2015.  Physical Exam: Normal amount of pot op watery discharge Uterus is appropriately tender  Diagnostic Tests:   Pathology: benign  Impression: S/p endo ablation  Plan:   Follow up: 1  years  Florian Buff, MD  Meds  ordered this encounter  Medications  . HYDROcodone-acetaminophen (NORCO/VICODIN) 5-325 MG tablet    Sig: Take 1 tablet by mouth every 6 (six) hours as needed.    Dispense:  30 tablet    Refill:  0

## 2015-07-19 ENCOUNTER — Telehealth: Payer: Self-pay | Admitting: Family Medicine

## 2015-07-19 NOTE — Telephone Encounter (Signed)
Stp and she is c/o nasal congestion and trouble breathing when  Laying down. Must sit up to sleep and she is also coughing a lot at night. Advised pt she would ntbs and pt states she will CB to schedule appt.

## 2015-07-20 ENCOUNTER — Encounter: Payer: Self-pay | Admitting: Family Medicine

## 2015-07-20 ENCOUNTER — Ambulatory Visit (INDEPENDENT_AMBULATORY_CARE_PROVIDER_SITE_OTHER): Payer: Commercial Managed Care - HMO | Admitting: Family Medicine

## 2015-07-20 VITALS — BP 113/78 | HR 81 | Temp 96.9°F | Ht 62.0 in | Wt 185.7 lb

## 2015-07-20 DIAGNOSIS — J209 Acute bronchitis, unspecified: Secondary | ICD-10-CM

## 2015-07-20 MED ORDER — AZITHROMYCIN 250 MG PO TABS: ORAL_TABLET | ORAL | Status: DC

## 2015-07-20 MED ORDER — NICOTINE 14 MG/24HR TD PT24
14.0000 mg | MEDICATED_PATCH | Freq: Every day | TRANSDERMAL | Status: DC
Start: 2015-07-20 — End: 2015-07-27

## 2015-07-20 MED ORDER — NICOTINE 7 MG/24HR TD PT24: 7.0000 mg | MEDICATED_PATCH | Freq: Every day | TRANSDERMAL | Status: DC

## 2015-07-20 MED ORDER — NICOTINE 21 MG/24HR TD PT24: 21.0000 mg | MEDICATED_PATCH | Freq: Every day | TRANSDERMAL | Status: DC

## 2015-07-20 NOTE — Progress Notes (Signed)
   HPI  Patient presents today here for evaluation of acute illness.  Patient explains that she's had cough, wheezing, dyspnea for 2 days. She also notes sore throat, difficulty swallowing, and swollen lymph nodes of the last 7 days.  She denies fever, chills, sweats, malaise. She has been having hot flashes which may be due to perimenopausal symptoms as these occur even when she is not ill. She also notes left ear pain  She does have a history of vomiting a small amount of blood which has continued, but not worsened. She was diagnosed with Mallory-Weiss tears previously and she has an appointment with GI. She does understand that if her symptoms worsen she should go straight to the emergency room.  PMH: Smoking status noted ROS: Per HPI  Objective: BP 113/78 mmHg  Pulse 81  Temp(Src) 96.9 F (36.1 C) (Oral)  Ht 5\' 2"  (1.575 m)  Wt 185 lb 10.4 oz (84.21 kg)  BMI 33.95 kg/m2  LMP 06/18/2015 Gen: NAD, alert, cooperative with exam HEENT: NCAT, TMs normal bilaterally, nares with slight swelling of the right turbinate, oropharynx clear CV: RRR, good S1/S2, no murmur Resp: CTABL, no wheezes, non-labored Ext: No edema, warm Neuro: Alert and oriented, No gross deficits  Assessment and plan:  # Acute bronchitis, sore throat Treat with azithromycin considering she is a 2 pack per day smoker and 7 days of illness. Encouraged her to quit smoking, she would like to as detailed below. Reasons to return and reasons to seek emergency care outlined and discussed in detail.  # Tobacco abuse She would like to quit, requests prescription for the patch. Given prescriptions and directions   Meds ordered this encounter  Medications  . azithromycin (ZITHROMAX) 250 MG tablet    Sig: Take 2 tablets on day 1 and 1 tablet daily after that    Dispense:  6 tablet    Refill:  0  . nicotine (EQ NICOTINE) 21 mg/24hr patch    Sig: Place 1 patch (21 mg total) onto the skin daily.    Dispense:  14  patch    Refill:  0  . nicotine (EQ NICOTINE) 14 mg/24hr patch    Sig: Place 1 patch (14 mg total) onto the skin daily.    Dispense:  14 patch    Refill:  0  . nicotine (EQ NICOTINE) 7 mg/24hr patch    Sig: Place 1 patch (7 mg total) onto the skin daily.    Dispense:  14 patch    Refill:  0    Laroy Apple, MD [facility] 07/20/2015, 4:42 PM

## 2015-07-20 NOTE — Patient Instructions (Signed)
Great to see you!  On your quit date, start 1 patch (21 mg) daily for two weeks, then decrease to 14 mg patch daily for 2 week sthen 7 mg patch daily for 2 weeks then quit the patches as well  Acute Bronchitis Bronchitis is inflammation of the airways that extend from the windpipe into the lungs (bronchi). The inflammation often causes mucus to develop. This leads to a cough, which is the most common symptom of bronchitis.  In acute bronchitis, the condition usually develops suddenly and goes away over time, usually in a couple weeks. Smoking, allergies, and asthma can make bronchitis worse. Repeated episodes of bronchitis may cause further lung problems.  CAUSES Acute bronchitis is most often caused by the same virus that causes a cold. The virus can spread from person to person (contagious) through coughing, sneezing, and touching contaminated objects. SIGNS AND SYMPTOMS   Cough.   Fever.   Coughing up mucus.   Body aches.   Chest congestion.   Chills.   Shortness of breath.   Sore throat.  DIAGNOSIS  Acute bronchitis is usually diagnosed through a physical exam. Your health care provider will also ask you questions about your medical history. Tests, such as chest X-rays, are sometimes done to rule out other conditions.  TREATMENT  Acute bronchitis usually goes away in a couple weeks. Oftentimes, no medical treatment is necessary. Medicines are sometimes given for relief of fever or cough. Antibiotic medicines are usually not needed but may be prescribed in certain situations. In some cases, an inhaler may be recommended to help reduce shortness of breath and control the cough. A cool mist vaporizer may also be used to help thin bronchial secretions and make it easier to clear the chest.  HOME CARE INSTRUCTIONS  Get plenty of rest.   Drink enough fluids to keep your urine clear or pale yellow (unless you have a medical condition that requires fluid restriction).  Increasing fluids may help thin your respiratory secretions (sputum) and reduce chest congestion, and it will prevent dehydration.   Take medicines only as directed by your health care provider.  If you were prescribed an antibiotic medicine, finish it all even if you start to feel better.  Avoid smoking and secondhand smoke. Exposure to cigarette smoke or irritating chemicals will make bronchitis worse. If you are a smoker, consider using nicotine gum or skin patches to help control withdrawal symptoms. Quitting smoking will help your lungs heal faster.   Reduce the chances of another bout of acute bronchitis by washing your hands frequently, avoiding people with cold symptoms, and trying not to touch your hands to your mouth, nose, or eyes.   Keep all follow-up visits as directed by your health care provider.  SEEK MEDICAL CARE IF: Your symptoms do not improve after 1 week of treatment.  SEEK IMMEDIATE MEDICAL CARE IF:  You develop an increased fever or chills.   You have chest pain.   You have severe shortness of breath.  You have bloody sputum.   You develop dehydration.  You faint or repeatedly feel like you are going to pass out.  You develop repeated vomiting.  You develop a severe headache. MAKE SURE YOU:   Understand these instructions.  Will watch your condition.  Will get help right away if you are not doing well or get worse.   This information is not intended to replace advice given to you by your health care provider. Make sure you discuss any questions you  have with your health care provider.   Document Released: 10/05/2004 Document Revised: 09/18/2014 Document Reviewed: 02/18/2013 Elsevier Interactive Patient Education [employer].

## 2015-07-21 ENCOUNTER — Telehealth: Payer: Self-pay | Admitting: *Deleted

## 2015-07-21 ENCOUNTER — Telehealth: Payer: Self-pay | Admitting: Family Medicine

## 2015-07-21 ENCOUNTER — Telehealth: Payer: Self-pay | Admitting: Obstetrics & Gynecology

## 2015-07-21 NOTE — Telephone Encounter (Signed)
Pt was just seen yesterday but is now requesting supplies for incontinence, does she need a face to face?

## 2015-07-21 NOTE — Telephone Encounter (Signed)
Call entered under wrong MRN #.

## 2015-07-21 NOTE — Telephone Encounter (Signed)
Pt requesting refill on Hydrocodone. Pt also c/o heavy vaginal bleeding. Pt states had a Hysteroscopy Uterine Curettage and Endometrial Ablation on 07/07/2015.  Pt informed Dr. Elonda Husky has left the office for the day will be back in the office tomorrow.

## 2015-07-21 NOTE — Telephone Encounter (Signed)
This call was entered under the wrong pt. Will close encounter.

## 2015-07-22 ENCOUNTER — Other Ambulatory Visit: Payer: Self-pay | Admitting: Obstetrics & Gynecology

## 2015-07-22 ENCOUNTER — Telehealth: Payer: Self-pay | Admitting: Obstetrics & Gynecology

## 2015-07-22 MED ORDER — HYDROCODONE-ACETAMINOPHEN 5-325 MG PO TABS: 1.0000 | ORAL_TABLET | Freq: Four times a day (QID) | ORAL | Status: DC | PRN

## 2015-07-22 NOTE — Telephone Encounter (Signed)
Pt informed message routed to Dr. Elonda Husky for refill on Hydrocodone.

## 2015-07-23 ENCOUNTER — Telehealth: Payer: Self-pay | Admitting: Obstetrics & Gynecology

## 2015-07-23 NOTE — Telephone Encounter (Signed)
Pt informed per Dr. Elonda Husky will not refill Hydrocodone after this last RX on 07/22/2015. Pt verbalized understanding.

## 2015-07-26 ENCOUNTER — Telehealth: Payer: Self-pay | Admitting: Obstetrics & Gynecology

## 2015-07-26 NOTE — Telephone Encounter (Signed)
Noted pt has appointment tomorrow

## 2015-07-26 NOTE — Telephone Encounter (Signed)
Pt states she is "bleeding like a river and blood with urination." Pt had surgery on 07/07/2015 endometrial ablation. Pt given an appt for tomorrow for evaluation.

## 2015-07-27 ENCOUNTER — Encounter: Payer: Self-pay | Admitting: Obstetrics & Gynecology

## 2015-07-27 ENCOUNTER — Ambulatory Visit (INDEPENDENT_AMBULATORY_CARE_PROVIDER_SITE_OTHER): Payer: Commercial Managed Care - HMO | Admitting: Obstetrics & Gynecology

## 2015-07-27 VITALS — BP 126/78 | HR 60 | Ht 62.0 in | Wt 185.0 lb

## 2015-07-27 DIAGNOSIS — Z9889 Other specified postprocedural states: Secondary | ICD-10-CM

## 2015-07-27 DIAGNOSIS — N939 Abnormal uterine and vaginal bleeding, unspecified: Secondary | ICD-10-CM | POA: Diagnosis not present

## 2015-07-27 LAB — POCT HEMOGLOBIN: HEMOGLOBIN: 12 g/dL — AB (ref 12.2–16.2)

## 2015-07-27 MED ORDER — HYDROCODONE-ACETAMINOPHEN 5-325 MG PO TABS: 1.0000 | ORAL_TABLET | Freq: Four times a day (QID) | ORAL | Status: DC | PRN

## 2015-07-27 NOTE — Progress Notes (Signed)
Patient ID: Lynn Logan, female   DOB: [birth year], 44 y.o.   MRN: [id number] See post op note  Pt was concerned she was having longer and heavier bleeding than she was supposed to have i told her generally the bleeding is tapering now but not always i told her her bleeding pattern will be uncertain for 2-3 months after the ablation Her hemoglobin today is 12.0 which is stable  She is not bleeding today  Exam No blood in vault crevix normal Uterus normal size non tender Follow up prn   Florian Buff, MD 07/27/2015 3:12 PM

## 2015-07-29 ENCOUNTER — Ambulatory Visit: Payer: Commercial Managed Care - HMO | Admitting: Family Medicine

## 2015-08-09 ENCOUNTER — Other Ambulatory Visit: Payer: Self-pay | Admitting: Obstetrics & Gynecology

## 2015-08-09 ENCOUNTER — Telehealth: Payer: Self-pay | Admitting: Obstetrics & Gynecology

## 2015-08-09 MED ORDER — MEGESTROL ACETATE 40 MG PO TABS
ORAL_TABLET | ORAL | Status: DC
Start: 2015-08-09 — End: 2015-10-14

## 2015-08-09 NOTE — Telephone Encounter (Signed)
Pt states she has still not stopped bleeding. Pt states "bleeding heavier than what is was with large clots, runs down her legs when she stands." Please advise.

## 2015-08-09 NOTE — Telephone Encounter (Signed)
Pt informed per Dr. Elonda Husky Megace e-scribed, take 3 a day and schedule appt 1-2 weeks after pt starts taking med. Pt states will pick up Rx tomorrow, f/u appt made for 08/25/2015.

## 2015-08-11 ENCOUNTER — Telehealth: Payer: Self-pay | Admitting: Obstetrics & Gynecology

## 2015-08-11 NOTE — Telephone Encounter (Signed)
Per Dr. Elonda Husky pt to continue Megace 40 mg 3 daily, keep scheduled appt.

## 2015-08-11 NOTE — Telephone Encounter (Signed)
Pt states has been taking the megace 40 mg x 3 daily since her surgery 07/07/2015. Pt also requesting refill on Hydrocodone. Please advise.

## 2015-08-12 ENCOUNTER — Telehealth: Payer: Self-pay | Admitting: Obstetrics & Gynecology

## 2015-08-12 ENCOUNTER — Ambulatory Visit (INDEPENDENT_AMBULATORY_CARE_PROVIDER_SITE_OTHER): Payer: Commercial Managed Care - HMO | Admitting: Family Medicine

## 2015-08-12 ENCOUNTER — Encounter: Payer: Self-pay | Admitting: Family Medicine

## 2015-08-12 VITALS — BP 129/66 | HR 79 | Temp 96.9°F | Ht 62.0 in | Wt 187.2 lb

## 2015-08-12 DIAGNOSIS — F32A Depression, unspecified: Secondary | ICD-10-CM

## 2015-08-12 DIAGNOSIS — M5442 Lumbago with sciatica, left side: Secondary | ICD-10-CM

## 2015-08-12 DIAGNOSIS — F329 Major depressive disorder, single episode, unspecified: Secondary | ICD-10-CM | POA: Diagnosis not present

## 2015-08-12 DIAGNOSIS — N920 Excessive and frequent menstruation with regular cycle: Secondary | ICD-10-CM | POA: Diagnosis not present

## 2015-08-12 DIAGNOSIS — G6289 Other specified polyneuropathies: Secondary | ICD-10-CM

## 2015-08-12 LAB — CBC
HEMOGLOBIN: 12.2 g/dL (ref 11.1–15.9)
Hematocrit: 37.1 % (ref 34.0–46.6)
MCH: 22.1 pg — AB (ref 26.6–33.0)
MCHC: 32.9 g/dL (ref 31.5–35.7)
MCV: 67 fL — ABNORMAL LOW (ref 79–97)
Platelets: 339 10*3/uL (ref 150–379)
RBC: 5.52 x10E6/uL — AB (ref 3.77–5.28)
RDW: 21.1 % — ABNORMAL HIGH (ref 12.3–15.4)
WBC: 9.4 10*3/uL (ref 3.4–10.8)

## 2015-08-12 MED ORDER — DULOXETINE HCL 60 MG PO CPEP: 60.0000 mg | ORAL_CAPSULE | Freq: Every day | ORAL | Status: DC

## 2015-08-12 MED ORDER — HYDROCODONE-ACETAMINOPHEN 5-325 MG PO TABS: 1.0000 | ORAL_TABLET | Freq: Four times a day (QID) | ORAL | Status: DC | PRN

## 2015-08-12 NOTE — Progress Notes (Signed)
   HPI  Patient presents today here to follow-up for neuropathy  She has long-standing neuropathy in her bilateral lower extremities in her hands, as well as her left neck area. We previously felt that her neuropathy in her hands and feet are due to long-standing anemia, this has improved recently, however she states that her menorrhagia has returned and is worse than before her recent procedure. She describes heavy vaginal bleeding, however she denies any dizziness, weakness, or feeling faint.  Depression Long-standing depression She had suicidal thoughts about one week ago after her boyfriend used crack again. She considered jumping off a bridge, however she denies any suicidal thoughts today and states that she will call 911 or get someone to help her if she considers suicide again. She's been on Lexapro previously but had tremors which kept her from tolerating it.  Menorrhagia As above worse after her procedure for her reports, describes heavy vaginal bleeding but denies any symptoms of acute anemia  Back pain Left-sided back pain in the area of her SI joint She has neuropathy in both legs is described as numbness and tingling type pain across her anterior bilateral thighs  She has no bowel or bladder dysfunction, saddle anesthesia, or leg weakness. She states that the back pain is been bad for several years but does seem to be getting worse recently  PMH: Smoking status noted ROS: Per HPI  Objective: BP 129/66 mmHg  Pulse 79  Temp(Src) 96.9 F (36.1 C) (Oral)  Ht 5\' 2"  (1.575 m)  Wt 187 lb 3.2 oz (84.913 kg)  BMI 34.23 kg/m2  LMP 07/20/2015 Gen: NAD, alert, cooperative with exam HEENT: NCAT, pale conjunctiva bilaterally, whitening of the skin consistent with vitiligo CV: RRR, good S1/S2, no murmur, brisk cap refill Resp: CTABL, no wheezes, non-labored Ext: No edema, warm Neuro: Alert and oriented, No gross deficits  PHQ-9 score 18., 2 on #9, no SI today as  above  Assessment and plan:  # Neuropathy I previously present for her neuropathy was due to severe long-standing anemia, this is still possibly the case. No signs of diabetes Start Cymbalta with concomitant severe depression Follow-up 2 weeks Consider addition of Lyrica in 6-8 weeks if she is tolerating Cymbalta well and still has issues with neuropathy  # Menorrhagia Being managed by GYN currently, with her severe symptoms I have sent a stat CBC Plan to send the ED for transfusion if hemoglobin less than 7, however right now she is not symptomatic and her heart rate is normal so I have only given her recommendations to go to the ER if she develops symptoms of acute anemia with persistent vaginal bleeding  # Back pain Long-standing low back pain consistent with SI joint dysfunction, possibly sciatica Starting Cymbalta  # Depression With suicidal ideation recently, however she denies suicidal ideation today. Starting Cymbalta She contracts for safety Follow-up 2 weeks    Orders Placed This Encounter  Procedures  . CBC    Meds ordered this encounter  Medications  . DULoxetine (CYMBALTA) 60 MG capsule    Sig: Take 1 capsule (60 mg total) by mouth daily.    Dispense:  30 capsule    Refill:  [place], MD [facility] 08/12/2015, 10:09 AM

## 2015-08-12 NOTE — Telephone Encounter (Signed)
Pt called returning the nurses phone call. Please contact pt

## 2015-08-12 NOTE — Telephone Encounter (Signed)
Pt informed Hydrocodone Rx at front desk for pick up, advised pt per Dr. Elonda Husky "no more than 2 tablets of Hydrocodone per day, concerned about pt taking to much pain meds."   Pt states saw Dr.Bradshaw today and was told her hemoglobin was low. After reviewing chart, Hemoglobin today 12.2. Informed pt her hemoglobin was stable to keep her appt with Dr. Elonda Husky on 08/25/2015 per Dr. Elonda Husky. Pt verbalized understanding.

## 2015-08-12 NOTE — Telephone Encounter (Signed)
Pt informed to keep her appt for 08/25/15 and continue to Megace. Will discuss with Dr. Elonda Husky pain medication. Pt verbalized understanding.

## 2015-08-12 NOTE — Patient Instructions (Signed)
Great to see you!  You should hear from Dr. Brynda Greathouse office today, if not please call them again. If you develop dizziness, weakness, or feel like you are going to pass out with severe vaginal bleeding please go to the ER.   I have started a new medicine, cymbalta, which will help nerve pain, depression, and back pain.   Taking the medicine as directed and not missing any doses is one of the best things you can do to treat your depression.  Here are some things to keep in mind:  1) Side effects (stomach upset, some increased anxiety) may happen before you notice a benefit.  These side effects typically go away over time. 2) Changes to your dose of medicine or a change in medication all together is sometimes necessary 3) Most people need to be on medication at least 6-12 months 4) Many people will notice an improvement within two weeks but the full effect of the medication can take up to 4-6 weeks 5) Stopping the medication when you start feeling better often results in a return of symptoms 6) If you start having thoughts of hurting yourself or others after starting this medicine, please call 911 immediately.

## 2015-08-12 NOTE — Telephone Encounter (Signed)
Pt called stating that she needs a refill of her Hydrocodone. Please contact pt

## 2015-08-20 NOTE — Telephone Encounter (Signed)
Explained to pt, that they would have to send a form for [name] to fill out

## 2015-08-25 ENCOUNTER — Ambulatory Visit: Payer: Self-pay | Admitting: Obstetrics & Gynecology

## 2015-08-26 ENCOUNTER — Ambulatory Visit (INDEPENDENT_AMBULATORY_CARE_PROVIDER_SITE_OTHER): Payer: Commercial Managed Care - HMO

## 2015-08-26 ENCOUNTER — Encounter: Payer: Self-pay | Admitting: Family Medicine

## 2015-08-26 ENCOUNTER — Ambulatory Visit (INDEPENDENT_AMBULATORY_CARE_PROVIDER_SITE_OTHER): Payer: Commercial Managed Care - HMO | Admitting: Family Medicine

## 2015-08-26 VITALS — BP 131/82 | HR 85 | Temp 96.7°F | Ht 62.0 in | Wt 190.0 lb

## 2015-08-26 DIAGNOSIS — M5441 Lumbago with sciatica, right side: Secondary | ICD-10-CM

## 2015-08-26 MED ORDER — TRAMADOL HCL 50 MG PO TABS: 50.0000 mg | ORAL_TABLET | Freq: Three times a day (TID) | ORAL | Status: DC | PRN

## 2015-08-26 NOTE — Progress Notes (Signed)
   HPI  Patient presents today to discuss back pain.   She has long-standing low back pain with sciatica over right side intermittently. Describes it as a continuous pain with intermittent worsening and intermittent symptoms down the right posterior leg She's tried Advil for this, however she has a history of upper GI bleeding so I cautioned her against this. She has no saddle anesthesia, bowel continence, or difficulty walking. She recently tried [employer] and had a good improvement with this.   Depression Denies suicidal ideation today, continues to contract for safety Doing well with Cymbalta with no GI side effects or other complaints. Has difficult social situation currently with her ex-boyfriend  PMH: Smoking status noted ROS: Per HPI  Objective: BP 131/82 mmHg  Pulse 85  Temp(Src) 96.7 F (35.9 C) (Oral)  Ht 5\' 2"  (1.575 m)  Wt 190 lb (86.183 kg)  BMI 34.74 kg/m2  LMP 07/20/2015 Gen: NAD, alert, cooperative with exam HEENT: NCAT CV: RRR, good S1/S2, no murmur Resp: CTABL, no wheezes, non-labored Ext: No edema, warm Neuro: Alert and oriented, No gross deficits  Assessment and plan:  # Low back pain Long-standing low back pain, several years of symptoms with worsening recently. Tolerating Cymbalta, continue Adding tramadol, discussed possibility of serotonin syndrome and she will stop medication if any symptoms develop Keeping tramadol dose low Plain film of the back today pending Orthopedic referral   Depression Tolerating Cymbalta well,, follow-up 4-6 weeks. Denies suicidal ideation, in the past she's had this, she contracts for safety.  Orders Placed This Encounter  Procedures  . Ambulatory referral to Orthopedic Surgery    Referral Priority:  Routine    Referral Type:  Surgical    Referral Reason:  Specialty Services Required    Requested Specialty:  Orthopedic Surgery    Number of Visits Requested:  1    Meds ordered this encounter  Medications  .  traMADol (ULTRAM) 50 MG tablet    Sig: Take 1 tablet (50 mg total) by mouth every 8 (eight) hours as needed.    Dispense:  60 tablet    Refill:  [place], MD [facility] 08/26/2015, 10:13 AM

## 2015-08-26 NOTE — Patient Instructions (Addendum)
Great to see you!  Come back in 1 month  We will work on a referral to a bone specialist.   Try tramadol for your pain.

## 2015-08-27 ENCOUNTER — Ambulatory Visit (INDEPENDENT_AMBULATORY_CARE_PROVIDER_SITE_OTHER): Payer: Commercial Managed Care - HMO | Admitting: Obstetrics & Gynecology

## 2015-08-27 ENCOUNTER — Encounter: Payer: Self-pay | Admitting: Obstetrics & Gynecology

## 2015-08-27 VITALS — BP 136/90 | HR 68 | Ht 62.0 in | Wt 189.0 lb

## 2015-08-27 DIAGNOSIS — N939 Abnormal uterine and vaginal bleeding, unspecified: Secondary | ICD-10-CM | POA: Diagnosis not present

## 2015-08-27 LAB — POCT HEMOGLOBIN: Hemoglobin: 12.6 g/dL (ref 12.2–16.2)

## 2015-08-27 NOTE — Progress Notes (Signed)
Patient ID: Lynn Logan, female   DOB: 05-25-1971, 44 y.o.   MRN: [id number] Pt has been having on going bleeding after her endometrial ablation Her blood counts have remained stable >12 grams Not heavy now, just spots, bight red Cramping is better when she does not have heavy bleeding  Exam NEFG Vagina pink moinst no blood in vault at all Cervix no CMT Uterus is normal size shape and contour non tender Adnexa is negative  Problematic bleeding s/p ablation  Continue megestrol for no Sonogram 2 weeks to evaluate for any abnormalities Only treatment left would be an IUD and hysterectomy  Filed Vitals:   08/27/15 1143  BP: 136/90  Pulse: 68   Past Medical History  Diagnosis Date  . CHF (congestive heart failure) ([place])   . Anxiety   . Depression   . Chronic back pain   . Right lumbar radiculopathy   . Fibromyalgia   . Arthritis     Rheumatoid    Past Surgical History  Procedure Laterality Date  . Tubal ligation    . Cesarean section    . Dilitation & currettage/hystroscopy with novasure ablation N/A 07/07/2015    Procedure: HYSTEROSCOPY, UTERINE CURETTAGE, ENDOMETRIAL  ABLATION Uterine Cavity Length=6.5cm Uterine Cavity Width=4.5cm Power=161 Watts Time=1 minute 19 seconds;  Surgeon: Florian Buff, MD;  Location: [facility];  Service: Gynecology;  Laterality: N/A;  . Polypectomy  07/07/2015    Procedure: POLYPECTOMY;  Surgeon: Florian Buff, MD;  Location: [facility];  Service: Gynecology;;    OB History    No data available      Allergies  Allergen Reactions  . Toradol [Ketorolac Tromethamine] Itching    Social History   Social History  . Marital Status: Divorced    Spouse Name: N/A  . Number of Children: N/A  . Years of Education: N/A   Social History Main Topics  . Smoking status: Current Every Day Smoker -- 1.00 packs/day for 30 years    Types: Cigarettes  . Smokeless tobacco: Former Systems developer  . Alcohol Use: No  . Drug Use: No     Comment: Hx of marijuana  use - None now  . Sexual Activity: Not Currently    Birth Control/ Protection: Surgical   Other Topics Concern  . None   Social History Narrative    Family History  Problem Relation Age of Onset  . Diabetes Mother   . Congestive Heart Failure Mother   . Depression Father   . Hypertension Father   . Cancer Father   . Heart disease Maternal Uncle

## 2015-08-31 ENCOUNTER — Telehealth: Payer: Self-pay | Admitting: Family Medicine

## 2015-08-31 NOTE — Telephone Encounter (Signed)
Patient aware of results.

## 2015-09-12 DIAGNOSIS — I219 Acute myocardial infarction, unspecified: Secondary | ICD-10-CM

## 2015-09-12 HISTORY — DX: Acute myocardial infarction, unspecified: I21.9

## 2015-09-16 ENCOUNTER — Telehealth: Payer: Self-pay | Admitting: Family Medicine

## 2015-09-17 ENCOUNTER — Ambulatory Visit: Payer: Commercial Managed Care - HMO | Admitting: Obstetrics & Gynecology

## 2015-09-17 ENCOUNTER — Other Ambulatory Visit: Payer: Commercial Managed Care - HMO

## 2015-09-22 NOTE — Telephone Encounter (Signed)
[facility] ; Not our pt

## 2015-09-23 ENCOUNTER — Encounter: Payer: Self-pay | Admitting: Family Medicine

## 2015-09-23 ENCOUNTER — Ambulatory Visit: Payer: Commercial Managed Care - HMO

## 2015-09-23 ENCOUNTER — Ambulatory Visit: Payer: Commercial Managed Care - HMO | Admitting: Obstetrics & Gynecology

## 2015-09-23 ENCOUNTER — Ambulatory Visit (INDEPENDENT_AMBULATORY_CARE_PROVIDER_SITE_OTHER): Payer: Commercial Managed Care - HMO | Admitting: Family Medicine

## 2015-09-23 VITALS — BP 133/84 | HR 94 | Temp 97.8°F | Ht 62.0 in | Wt 191.6 lb

## 2015-09-23 DIAGNOSIS — G6289 Other specified polyneuropathies: Secondary | ICD-10-CM

## 2015-09-23 DIAGNOSIS — N10 Acute tubulo-interstitial nephritis: Secondary | ICD-10-CM | POA: Diagnosis not present

## 2015-09-23 DIAGNOSIS — R300 Dysuria: Secondary | ICD-10-CM | POA: Diagnosis not present

## 2015-09-23 LAB — POCT UA - MICROSCOPIC ONLY
CASTS, UR, LPF, POC: NEGATIVE
CRYSTALS, UR, HPF, POC: NEGATIVE
MUCUS UA: NEGATIVE
Yeast, UA: NEGATIVE

## 2015-09-23 LAB — POCT URINALYSIS DIPSTICK
BILIRUBIN UA: NEGATIVE
GLUCOSE UA: NEGATIVE
KETONES UA: NEGATIVE
Nitrite, UA: NEGATIVE
SPEC GRAV UA: 1.015
Urobilinogen, UA: NEGATIVE
pH, UA: 6

## 2015-09-23 LAB — POCT GLYCOSYLATED HEMOGLOBIN (HGB A1C): HEMOGLOBIN A1C: 5.5

## 2015-09-23 MED ORDER — LEVOFLOXACIN 750 MG PO TABS: 750.0000 mg | ORAL_TABLET | Freq: Every day | ORAL | Status: DC

## 2015-09-23 MED ORDER — CEFTRIAXONE SODIUM 1 G IJ SOLR
1.0000 g | Freq: Once | INTRAMUSCULAR | Status: AC
  Administered 2015-09-23: 1 g via INTRAMUSCULAR

## 2015-09-23 MED ORDER — GABAPENTIN 300 MG PO CAPS: 300.0000 mg | ORAL_CAPSULE | Freq: Three times a day (TID) | ORAL | Status: DC

## 2015-09-23 NOTE — Patient Instructions (Addendum)
Great to see you!  I am treating you for a kidney infection, pyelonephritis  Start gabapentin for your nerve pain.  Try 1 capsule once every night for 1 week, if you think that it is not making you sleepy you can try it 3 times a day.   Please come back in 1 month to follow up for numbness and tingling in the hands and feet  Pyelonephritis, Adult Pyelonephritis is a kidney infection. The kidneys are the organs that filter a person's blood and move waste out of the bloodstream and into the urine. Urine passes from the kidneys, through the ureters, and into the bladder. There are two main types of pyelonephritis:  Infections that come on quickly without any warning (acute pyelonephritis).  Infections that last for a long period of time (chronic pyelonephritis). In most cases, the infection clears up with treatment and does not cause further problems. More severe infections or chronic infections can sometimes spread to the bloodstream or lead to other problems with the kidneys. CAUSES This condition is usually caused by:  Bacteria traveling from the bladder to the kidney through infected urine. The urine in the bladder can become infected with bacteria from:  Bladder infection (cystitis).  Inflammation of the prostate gland (prostatitis).  Sexual intercourse, in females.  Bacteria traveling from the bloodstream to the kidney. RISK FACTORS This condition is more likely to develop in:  Pregnant women.  Older people.  People who have diabetes.  People who have kidney stones or bladder stones.  People who have other abnormalities of the kidney or ureter.  People who have a catheter placed in the bladder.  People who have cancer.  People who are sexually active.  Women who use spermicides.  People who have had a prior urinary tract infection. SYMPTOMS Symptoms of this condition include:  Frequent urination.  Strong or persistent urge to urinate.  Burning or stinging  when urinating.  Abdominal pain.  Back pain.  Pain in the side or flank area.  Fever.  Chills.  Blood in the urine, or dark urine.  Nausea.  Vomiting. DIAGNOSIS This condition may be diagnosed based on:  Medical history and physical exam.  Urine tests.  Blood tests. You may also have imaging tests of the kidneys, such as an ultrasound or CT scan. TREATMENT Treatment for this condition may depend on the severity of the infection.  If the infection is mild and is found early, you may be treated with antibiotic medicines taken by mouth. You will need to drink fluids to remain hydrated.  If the infection is more severe, you may need to stay in the hospital and receive antibiotics given directly into a vein through an IV tube. You may also need to receive fluids through an IV tube if you are not able to remain hydrated. After your hospital stay, you may need to take oral antibiotics for a period of time. Other treatments may be required, depending on the cause of the infection. HOME CARE INSTRUCTIONS Medicines  Take over-the-counter and prescription medicines only as told by your health care provider.  If you were prescribed an antibiotic medicine, take it as told by your health care provider. Do not stop taking the antibiotic even if you start to feel better. General Instructions  Drink enough fluid to keep your urine clear or pale yellow.  Avoid caffeine, tea, and carbonated beverages. They tend to irritate the bladder.  Urinate often. Avoid holding in urine for long periods of time.  Urinate before and after sex.  After a bowel movement, women should cleanse from front to back. Use each tissue only once.  Keep all follow-up visits as told by your health care provider. This is important. SEEK MEDICAL CARE IF:  Your symptoms do not get better after 2 days of treatment.  Your symptoms get worse.  You have a fever. SEEK IMMEDIATE MEDICAL CARE IF:  You are unable  to take your antibiotics or fluids.  You have shaking chills.  You vomit.  You have severe flank or back pain.  You have extreme weakness or fainting.   This information is not intended to replace advice given to you by your health care provider. Make sure you discuss any questions you have with your health care provider.   Document Released: 08/28/2005 Document Revised: 05/19/2015 Document Reviewed: 12/21/2014 Elsevier Interactive Patient Education [employer].

## 2015-09-23 NOTE — Addendum Note (Signed)
Addended by: Nigel Berthold C on: 09/23/2015 04:05 PM   Modules accepted: Orders

## 2015-09-23 NOTE — Progress Notes (Signed)
   HPI  Patient presents today here to discuss dysuria and numbness in the hands and feet.  Dysuria. Patient explains she's had nausea with dry heaving, and dysuria, right-sided flank pain, and generalized abdominal pain as well as suprapubic pain over the last 3 days. She's also had malaise. She's tolerating food and fluids easily.  Neuropathy. She has unexplained neuropathy, not really helped by Cymbalta so far. She also has concomitant depression which is improved with Cymbalta. She's had a mildly sore throat since her EGD last week.  She is a smoker PMH: Smoking status noted ROS: Per HPI  Objective: BP 133/84 mmHg  Pulse 94  Temp(Src) 97.8 F (36.6 C) (Oral)  Ht 5\' 2"  (1.575 m)  Wt 191 lb 9.6 oz (86.909 kg)  BMI 35.04 kg/m2  LMP 07/07/2015 Gen: NAD, alert, cooperative with exam HEENT: NCAT, TMs normal bilaterally, nares clear, oropharynx clear CV: RRR, good S1/S2, no murmur Resp: Nonlabored, scattered expiratory wheeze Abd: SNTND, BS present, no guarding or organomegaly are not positive CVA tenderness on the right Ext: No edema, warm Neuro: Alert and oriented, No gross deficits  Assessment and plan:  # Pyelonephritis Treating with 1 g of Rocephin as well as Levaquin, this is to cover possible pneumonia given one week of cough and slightly worsening shortness of breath Send for culture  # Neuropathy Check A1c Start gabapentin, continue Cymbalta Discussed sedation with gabapentin, starting aggressive with 300 mg dose at night for 1 week, then adding daytime dose if she is not experiencing any somnolence. Symptoms are worse at night Follow-up one month   Orders Placed This Encounter  Procedures  . Urine culture  . CBC with Differential  . POCT urinalysis dipstick  . POCT UA - Microscopic Only  . POCT glycosylated hemoglobin (Hb A1C)    Meds ordered this encounter  Medications  . levofloxacin (LEVAQUIN) 750 MG tablet    Sig: Take 1 tablet (750 mg total)  by mouth daily.    Dispense:  7 tablet    Refill:  0  . gabapentin (NEURONTIN) 300 MG capsule    Sig: Take 1 capsule (300 mg total) by mouth 3 (three) times daily.    Dispense:  90 capsule    Refill:  [place], MD [place] 09/23/2015, 3:51 PM

## 2015-09-24 ENCOUNTER — Telehealth: Payer: Self-pay | Admitting: Family Medicine

## 2015-09-24 LAB — CBC WITH DIFFERENTIAL/PLATELET
BASOS: 0 %
Basophils Absolute: 0.1 10*3/uL (ref 0.0–0.2)
EOS (ABSOLUTE): 0.1 10*3/uL (ref 0.0–0.4)
EOS: 1 %
HEMATOCRIT: 41.8 % (ref 34.0–46.6)
HEMOGLOBIN: 13.1 g/dL (ref 11.1–15.9)
IMMATURE GRANULOCYTES: 0 %
Immature Grans (Abs): 0 10*3/uL (ref 0.0–0.1)
LYMPHS ABS: 2.3 10*3/uL (ref 0.7–3.1)
Lymphs: 16 %
MCH: 21.5 pg — ABNORMAL LOW (ref 26.6–33.0)
MCHC: 31.3 g/dL — ABNORMAL LOW (ref 31.5–35.7)
MCV: 69 fL — AB (ref 79–97)
MONOCYTES: 4 %
Monocytes Absolute: 0.6 10*3/uL (ref 0.1–0.9)
NEUTROS PCT: 79 %
Neutrophils Absolute: 11.4 10*3/uL — ABNORMAL HIGH (ref 1.4–7.0)
Platelets: 396 10*3/uL — ABNORMAL HIGH (ref 150–379)
RBC: 6.09 x10E6/uL — AB (ref 3.77–5.28)
RDW: 16.2 % — ABNORMAL HIGH (ref 12.3–15.4)
WBC: 14.5 10*3/uL — AB (ref 3.4–10.8)

## 2015-09-24 NOTE — Telephone Encounter (Signed)
Patient advised that she should give the medication some time to work. I advised patient to try it for a month until her follow up.

## 2015-09-25 LAB — URINE CULTURE

## 2015-09-27 ENCOUNTER — Ambulatory Visit: Payer: Commercial Managed Care - HMO | Admitting: Family Medicine

## 2015-09-28 ENCOUNTER — Telehealth: Payer: Self-pay | Admitting: Family Medicine

## 2015-09-28 NOTE — Telephone Encounter (Signed)
Called to discuss labs, someone answered and stated she wasn't there.   Urine culture grew nothing significant. As long as she is improving she should be fine but the diagnosis from her recent acute visit is unclear.   Laroy Apple, MD [facility] 09/28/2015, 2:41 PM

## 2015-09-30 ENCOUNTER — Telehealth: Payer: Self-pay | Admitting: Family Medicine

## 2015-09-30 DIAGNOSIS — M5441 Lumbago with sciatica, right side: Secondary | ICD-10-CM

## 2015-09-30 NOTE — Telephone Encounter (Signed)
[facility] with referral, will ask nursing to write with the name of practice as well.    Laroy Apple, MD [facility] 09/30/2015, 5:28 PM

## 2015-09-30 NOTE — Telephone Encounter (Signed)
Patient is wanting another referral to orthopedic not chiropractor. Is it ok to do referral

## 2015-10-01 ENCOUNTER — Ambulatory Visit (INDEPENDENT_AMBULATORY_CARE_PROVIDER_SITE_OTHER): Payer: Commercial Managed Care - HMO | Admitting: Obstetrics & Gynecology

## 2015-10-01 ENCOUNTER — Ambulatory Visit (INDEPENDENT_AMBULATORY_CARE_PROVIDER_SITE_OTHER): Payer: Commercial Managed Care - HMO

## 2015-10-01 ENCOUNTER — Other Ambulatory Visit: Payer: Commercial Managed Care - HMO

## 2015-10-01 ENCOUNTER — Encounter: Payer: Self-pay | Admitting: Obstetrics & Gynecology

## 2015-10-01 VITALS — BP 120/80 | HR 74 | Wt 192.0 lb

## 2015-10-01 DIAGNOSIS — D259 Leiomyoma of uterus, unspecified: Secondary | ICD-10-CM | POA: Diagnosis not present

## 2015-10-01 DIAGNOSIS — N921 Excessive and frequent menstruation with irregular cycle: Secondary | ICD-10-CM

## 2015-10-01 DIAGNOSIS — N939 Abnormal uterine and vaginal bleeding, unspecified: Secondary | ICD-10-CM

## 2015-10-01 NOTE — Progress Notes (Signed)
PELVIC US TA/TV:heterogenous anteverted uterus w/mult fibroids, (#1) subserosal fibroid ant fundal 1.2 x 1.2 x .8cm,(#2)ant.submucosal fibroid .6 x .6 x .6cm, (#3) post rt intramural .9 x .7 x 1 cm, EEC 16 mm w/ a 1.3 x 1.3 x .9cm endometrial polyp,normal ov's bilat (mobile),no free fluid,no pain during ultrasound

## 2015-10-01 NOTE — Progress Notes (Signed)
Patient ID: Lynn Logan, female   DOB: [birth year], 45 y.o.   MRN: [id number] Follow up appointment for results  Chief Complaint  Patient presents with  . Follow-up    ultrasound today    Blood pressure 120/80, pulse 74, weight 192 lb (87.091 kg), last menstrual period 09/12/2015.  US Transvaginal Non-ob  10/01/2015  GYNECOLOGIC SONOGRAM Lynn Logan is a 45 y.o. LMP 09/12/2015 for a pelvic sonogram for vag.bleeding s/p ablation. Uterus                      9.9 x 5.5x 6.5 cm, heterogenous anteverted uterus w/mult fibroids, (#1) subserosal fibroid ant fundal 1.2 x 1.2 x .8 cm                                                                                (#2)ant.submucosal fibroid .6 x .6 x .6cm, (#3) post rt intramural fibroid .9 x .7 x 1 cm Endometrium          16 mm, symmetrical,  1.3 x 1.3 x .9cm endometrial polyp w/ a trace of  fluid with in the endometrium Right ovary             2.9 x 3.1 x 1.5 cm, wnl Left ovary                3.4 x 3.4 x 1.9 cm, wnl Technician Comments: PELVIC US TA/TV:heterogenous anteverted uterus w/mult fibroids, (#1) subserosal fibroid ant fundal 1.2 x 1.2 x .8cm,(#2)ant.submucosal fibroid .6 x .6 x .6cm, (#3) post rt intramural .9 x .7 x 1 cm, EEC 16 mm w/ a 1.3 x 1.3 x .9cm endometrial polyp,normal ov's bilat (mobile),no free fluid,no pain during ultrasound [employer] 10/01/2015 9:21 AM Clinical Impression and recommendations: I have reviewed the sonogram results above, combined with the patient's current clinical course, below are my impressions and any appropriate recommendations for management based on the sonographic findings. Endometrium suprisingly thick in a patient s/p ablation Fibroids stable Small insignificant polyp Ovaries are normal Lynn Logan H 10/01/2015 9:32 AM   US Pelvis Complete  10/01/2015  GYNECOLOGIC SONOGRAM Lynn Logan is a 45 y.o. LMP 09/12/2015 for a pelvic sonogram for vag.bleeding s/p ablation. Uterus                      9.9 x 5.5x  6.5 cm, heterogenous anteverted uterus w/mult fibroids, (#1) subserosal fibroid ant fundal 1.2 x 1.2 x .8 cm                                                                                (#2)ant.submucosal fibroid .6 x .6 x .6cm, (#3) post rt intramural fibroid .9 x .7 x 1 cm Endometrium          16 mm, symmetrical,  1.3 x 1.3 x .9cm endometrial polyp w/  a trace of  fluid with in the endometrium Right ovary             2.9 x 3.1 x 1.5 cm, wnl Left ovary                3.4 x 3.4 x 1.9 cm, wnl Technician Comments: PELVIC US TA/TV:heterogenous anteverted uterus w/mult fibroids, (#1) subserosal fibroid ant fundal 1.2 x 1.2 x .8cm,(#2)ant.submucosal fibroid .6 x .6 x .6cm, (#3) post rt intramural .9 x .7 x 1 cm, EEC 16 mm w/ a 1.3 x 1.3 x .9cm endometrial polyp,normal ov's bilat (mobile),no free fluid,no pain during ultrasound [employer] 10/01/2015 9:21 AM Clinical Impression and recommendations: I have reviewed the sonogram results above, combined with the patient's current clinical course, below are my impressions and any appropriate recommendations for management based on the sonographic findings. Endometrium suprisingly thick in a patient s/p ablation Fibroids stable Small insignificant polyp Ovaries are normal Lynn Logan H 10/01/2015 9:32 AM    Pt continues to bleed daily despite ablation and megestrol  Exam Appropriate for vaginal hysterectomy Recommend removing tubes an ovaries due to age  MEDS ordered this encounter: No orders of the defined types were placed in this encounter.    Orders for this encounter: No orders of the defined types were placed in this encounter.    Plan: TVHBSO Follow Up:     Face to face time:  15 minutes  Greater than 50% of the visit time was spent in counseling and coordination of care with the patient.  The summary and outline of the counseling and care coordination is summarized in the note above.   All questions were answered.  Past Medical History   Diagnosis Date  . CHF (congestive heart failure) ([place])   . Anxiety   . Depression   . Chronic back pain   . Right lumbar radiculopathy   . Fibromyalgia   . Arthritis     Rheumatoid    Past Surgical History  Procedure Laterality Date  . Tubal ligation    . Cesarean section    . Dilitation & currettage/hystroscopy with novasure ablation N/A 07/07/2015    Procedure: HYSTEROSCOPY, UTERINE CURETTAGE, ENDOMETRIAL  ABLATION Uterine Cavity Length=6.5cm Uterine Cavity Width=4.5cm Power=161 Watts Time=1 minute 19 seconds;  Surgeon: Florian Buff, MD;  Location: [facility];  Service: Gynecology;  Laterality: N/A;  . Polypectomy  07/07/2015    Procedure: POLYPECTOMY;  Surgeon: Florian Buff, MD;  Location: [facility];  Service: Gynecology;;    OB History    No data available      Allergies  Allergen Reactions  . Toradol [Ketorolac Tromethamine] Itching    Social History   Social History  . Marital Status: Divorced    Spouse Name: N/A  . Number of Children: N/A  . Years of Education: N/A   Social History Main Topics  . Smoking status: Current Every Day Smoker -- 1.00 packs/day for 30 years    Types: Cigarettes  . Smokeless tobacco: Former Systems developer  . Alcohol Use: No  . Drug Use: No     Comment: Hx of marijuana use - None now  . Sexual Activity: Not Currently    Birth Control/ Protection: Surgical   Other Topics Concern  . None   Social History Narrative    Family History  Problem Relation Age of Onset  . Diabetes Mother   . Congestive Heart Failure Mother   . Depression Father   . Hypertension Father   .  Cancer Father   . Heart disease Maternal Uncle

## 2015-10-07 NOTE — Patient Instructions (Signed)
Lynn Logan  10/07/2015     @PREFPERIOPPHARMACY @   Your procedure is scheduled on  10/13/2015  Report to [facility] at  615  A.M.  Call this number if you have problems the morning of surgery:  [phone]   Remember:  Do not eat food or drink liquids after midnight.  Take these medicines the morning of surgery with A SIP OF WATER  Cymbalta, neurontin, prilosec.   Do not wear jewelry, make-up or nail polish.  Do not wear lotions, powders, or perfumes.  You may wear deodorant.  Do not shave 48 hours prior to surgery.  Men may shave face and neck.  Do not bring valuables to the hospital.  [facility] is not responsible for any belongings or valuables.  Contacts, dentures or bridgework may not be worn into surgery.  Leave your suitcase in the car.  After surgery it may be brought to your room.  For patients admitted to the hospital, discharge time will be determined by your treatment team.  Patients discharged the day of surgery will not be allowed to drive home.   Name and phone number of your driver:   family Special instructions:  none  Please read over the following fact sheets that you were given. Pain Booklet, Coughing and Deep Breathing, Blood Transfusion Information, MRSA Information, Surgical Site Infection Prevention, Anesthesia Post-op Instructions and Care and Recovery After Surgery      Bilateral Salpingo-Oophorectomy Bilateral salpingo-oophorectomy is the surgical removal of both fallopian tubes and both ovaries. The ovaries are small organs that produce eggs in women. The fallopian tubes transport the egg from the ovary to the womb (uterus). Usually, when this surgery is done, the uterus was previously removed. A bilateral salpingo-oophorectomy may be done to treat cancer or to reduce the risk of cancer in women who are at high risk. Removing both fallopian tubes and both ovaries will make you unable to become pregnant (sterile). It will  also put you into menopause so that you will no longer have menstrual periods and may have menopausal symptoms such as hot flashes, night sweats, and mood changes. It will not affect your sex drive. LET [facility] CARE PROVIDER KNOW ABOUT:  Any allergies you have.  All medicines you are taking, including vitamins, herbs, eye drops, creams, and over-the-counter medicines.  Previous problems you or members of your family have had with the use of anesthetics.  Any blood disorders you have.  Previous surgeries you have had.  Medical conditions you have. RISKS AND COMPLICATIONS Generally, this is a safe procedure. However, as with any procedure, complications can occur. Possible complications include:  Injury to surrounding organs.  Bleeding.  Infection.  Blood clots in the legs or lungs.  Problems related to anesthesia. BEFORE THE PROCEDURE  Ask your health care provider about changing or stopping your regular medicines. You may need to stop taking certain medicines, such as aspirin or blood thinners, at least 1 week before the surgery.  Do not eat or drink anything for at least 8 hours before the surgery.  If you smoke, do not smoke for at least 2 weeks before the surgery.  Make plans to have someone drive you home after the procedure or after your hospital stay. Also arrange for someone to help you with activities during recovery. PROCEDURE   You will be given medicine to help you relax before the procedure (sedative). You will then be  given medicine to make you sleep through the procedure (general anesthetic). These medicines will be given through an IV access tube that is put into one of your veins.  Once you are asleep, your lower abdomen will be shaved and cleaned. A thin, flexible tube (catheter) will be placed in your bladder.  The surgeon may use a laparoscopic, robotic, or open technique for this surgery:  In the laparoscopic technique, the surgery is done through  two small cuts (incisions) in the abdomen. A thin, lighted tube with a tiny camera on the end (laparoscope) is inserted into one of the incisions. The tools needed for the procedure are put through the other incision.  A robotic technique may be chosen to perform complex surgery in a small space. In the robotic technique, small incisions will be made. A camera and surgical instruments are passed through the incisions. Surgical instruments will be controlled with the help of a robotic arm.  In the open technique, the surgery is done through one large incision in the abdomen.  Using any of these techniques, the surgeon removes the fallopian tubes and ovaries. The blood vessels will be clamped and tied.  The surgeon then uses staples or stitches to close the incision or incisions. AFTER THE PROCEDURE  You will be taken to a recovery area where you will be monitored for 1 to 3 hours. Your blood pressure, pulse, and temperature will be checked often. You will remain in the recovery area until you are stable and waking up.  If the laparoscopic technique was used, you may be allowed to go home after several hours. You may have some shoulder pain after the laparoscopic procedure. This is normal and usually goes away in a day or two.  If the open technique was used, you will be admitted to the hospital for a couple of days.  You will be given pain medicine as needed.  The IV access tube and catheter will be removed before you are discharged.   This information is not intended to replace advice given to you by your health care provider. Make sure you discuss any questions you have with your health care provider.   Document Released: 08/28/2005 Document Revised: 09/02/2013 Document Reviewed: 02/19/2013 Elsevier Interactive Patient Education 2016 [employer] Bilateral Salpingo-Oophorectomy, Care After Refer to this sheet in the next few weeks. These instructions provide you with information on  caring for yourself after your procedure. Your health care provider may also give you more specific instructions. Your treatment has been planned according to current medical practices, but problems sometimes occur. Call your health care provider if you have any problems or questions after your procedure. WHAT TO EXPECT AFTER THE PROCEDURE After your procedure, it is typical to have the following:   Abdominal pain that can be controlled with medicine.  Vaginal spotting.  Constipation.  Menopausal symptoms such as hot flashes, vaginal dryness, and mood swings. HOME CARE INSTRUCTIONS   Get plenty of rest and sleep.  Only take over-the-counter or prescription medicines as directed by your health care provider. Do not take aspirin. It can cause bleeding.  Keep incision areas clean and dry. Remove or change bandages (dressings) only as directed by your health care provider.  Take showers instead of baths for a few weeks as directed by your health care provider.  Limit exercise and activities as directed by your health care provider. Do not lift anything heavier than 5 pounds (2.3 kg) until your health care provider approves.  Do  not drive until your health care provider approves.  Follow your health care provider's advice regarding diet. You may be able to resume your usual diet right away.  Drink enough fluids to keep your urine clear or pale yellow.  Do not douche, use tampons, or have sexual intercourse for 6 weeks after the procedure.  Do not drink alcohol until your health care provider says it is okay.  Take your temperature twice a day and write it down.  If you become constipated, you may:  Ask your health care provider about taking a mild laxative.  Add more fruit and bran to your diet.  Drink more fluids.  Follow up with your health care provider as directed. SEEK MEDICAL CARE IF:   You have swelling, redness, or increasing pain in the incision area.  You see pus  coming from the incision area.  You notice a bad smell coming from the wound or dressing.  You have pain, redness, or swelling where the IV access tube was placed.  Your incision is breaking open (the edges are not staying together).  You feel dizzy or feel like fainting.  You develop pain or bleeding when you urinate.  You develop diarrhea.  You develop nausea and vomiting.  You develop abnormal vaginal discharge.  You develop a rash.  You have pain that is not controlled with medicine. SEEK IMMEDIATE MEDICAL CARE IF:   You develop a fever.  You develop abdominal pain.  You have chest pain.  You develop shortness of breath.  You pass out.  You develop pain, swelling, or redness in your leg.  You develop heavy vaginal bleeding with or without blood clots.   This information is not intended to replace advice given to you by your health care provider. Make sure you discuss any questions you have with your health care provider.   Document Released: 08/28/2005 Document Revised: 04/30/2013 Document Reviewed: 02/19/2013 Elsevier Interactive Patient Education 2016 [place] Laparoscopically Assisted Vaginal Hysterectomy, Care After Refer to this sheet in the next few weeks. These instructions provide you with information on caring for yourself after your procedure. Your health care provider may also give you more specific instructions. Your treatment has been planned according to current medical practices, but problems sometimes occur. Call your health care provider if you have any problems or questions after your procedure. WHAT TO EXPECT AFTER THE PROCEDURE After your procedure, it is typical to have the following:  Abdominal pain. You will be given pain medicine to control it.  Sore throat from the breathing tube that was inserted during surgery. HOME CARE INSTRUCTIONS  Only take over-the-counter or prescription medicines for pain, discomfort, or fever as directed  by your health care provider.  Do not take aspirin. It can cause bleeding.  Do not drive when taking pain medicine.  Follow your health care provider's advice regarding diet, exercise, lifting, driving, and general activities.  Resume your usual diet as directed and allowed.  Get plenty of rest and sleep.  Do not douche, use tampons, or have sexual intercourse for at least 6 weeks, or until your health care provider gives you permission.  Change your bandages (dressings) as directed by your health care provider.  Monitor your temperature and notify your health care provider of a fever.  Take showers instead of baths for 2-3 weeks.  Do not drink alcohol until your health care provider gives you permission.  If you develop constipation, you may take a mild laxative with your health  care provider's permission. Bran foods may help with constipation problems. Drinking enough fluids to keep your urine clear or pale yellow may help as well.  Try to have someone home with you for 1-2 weeks to help around the house.  Keep all of your follow-up appointments as directed by your health care provider. SEEK MEDICAL CARE IF:   You have swelling, redness, or increasing pain around your incision sites.  You have pus coming from your incision.  You notice a bad smell coming from your incision.  Your incision breaks open.  You feel dizzy or lightheaded.  You have pain or bleeding when you urinate.  You have persistent diarrhea.  You have persistent nausea and vomiting.  You have abnormal vaginal discharge.  You have a rash.  You have any type of abnormal reaction or develop an allergy to your medicine.  You have poor pain control with your prescribed medicine. SEEK IMMEDIATE MEDICAL CARE IF:   You have a fever.  You have severe abdominal pain.  You have chest pain.  You have shortness of breath.  You faint.  You have pain, swelling, or redness in your leg.  You have  heavy vaginal bleeding with blood clots. MAKE SURE YOU:  Understand these instructions.  Will watch your condition.  Will get help right away if you are not doing well or get worse.   This information is not intended to replace advice given to you by your health care provider. Make sure you discuss any questions you have with your health care provider.   Document Released: 08/17/2011 Document Revised: 09/02/2013 Document Reviewed: 03/13/2013 Elsevier Interactive Patient Education [employer]. Hysterectomy Information  A hysterectomy is a surgery in which your uterus is removed. This surgery may be done to treat various medical problems. After the surgery, you will no longer have menstrual periods. The surgery will also make you unable to become pregnant (sterile). The fallopian tubes and ovaries can be removed (bilateral salpingo-oophorectomy) during this surgery as well.  REASONS FOR A HYSTERECTOMY  Persistent, abnormal bleeding.  Lasting (chronic) pelvic pain or infection.  The lining of the uterus (endometrium) starts growing outside the uterus (endometriosis).  The endometrium starts growing in the muscle of the uterus (adenomyosis).  The uterus falls down into the vagina (pelvic organ prolapse).  Noncancerous growths in the uterus (uterine fibroids) that cause symptoms.  Precancerous cells.  Cervical cancer or uterine cancer. TYPES OF HYSTERECTOMIES  Supracervical hysterectomy--In this type, the top part of the uterus is removed, but not the cervix.  Total hysterectomy--The uterus and cervix are removed.  Radical hysterectomy--The uterus, the cervix, and the fibrous tissue that holds the uterus in place in the pelvis (parametrium) are removed. WAYS A HYSTERECTOMY CAN BE PERFORMED  Abdominal hysterectomy--A large surgical cut (incision) is made in the abdomen. The uterus is removed through this incision.  Vaginal hysterectomy--An incision is made in the vagina.  The uterus is removed through this incision. There are no abdominal incisions.  Conventional laparoscopic hysterectomy--Three or four small incisions are made in the abdomen. A thin, lighted tube with a camera (laparoscope) is inserted into one of the incisions. Other tools are put through the other incisions. The uterus is cut into small pieces. The small pieces are removed through the incisions, or they are removed through the vagina.  Laparoscopically assisted vaginal hysterectomy (LAVH)--Three or four small incisions are made in the abdomen. Part of the surgery is performed laparoscopically and part vaginally. The uterus is  removed through the vagina.  Robot-assisted laparoscopic hysterectomy--A laparoscope and other tools are inserted into 3 or 4 small incisions in the abdomen. A computer-controlled device is used to give the surgeon a 3D image and to help control the surgical instruments. This allows for more precise movements of surgical instruments. The uterus is cut into small pieces and removed through the incisions or removed through the vagina. RISKS AND COMPLICATIONS  Possible complications associated with this procedure include:  Bleeding and risk of blood transfusion. Tell your health care provider if you do not want to receive any blood products.  Blood clots in the legs or lung.  Infection.  Injury to surrounding organs.  Problems or side effects related to anesthesia.  Conversion to an abdominal hysterectomy from one of the other techniques. WHAT TO EXPECT AFTER A HYSTERECTOMY  You will be given pain medicine.  You will need to have someone with you for the first 3-5 days after you go home.  You will need to follow up with your surgeon in 2-4 weeks after surgery to evaluate your progress.  You may have early menopause symptoms such as hot flashes, night sweats, and insomnia.  If you had a hysterectomy for a problem that was not cancer or not a condition that could  lead to cancer, then you no longer need Pap tests. However, even if you no longer need a Pap test, a regular exam is a good idea to make sure no other problems are starting.   This information is not intended to replace advice given to you by your health care provider. Make sure you discuss any questions you have with your health care provider.   Document Released: 02/21/2001 Document Revised: 06/18/2013 Document Reviewed: 05/05/2013 Elsevier Interactive Patient Education 2016 Elsevier Inc[employer] PATIENT INSTRUCTIONS POST-ANESTHESIA  IMMEDIATELY FOLLOWING SURGERY:  Do not drive or operate machinery for the first twenty four hours after surgery.  Do not make any important decisions for twenty four hours after surgery or while taking narcotic pain medications or sedatives.  If you develop intractable nausea and vomiting or a severe headache please notify your doctor immediately.  FOLLOW-UP:  Please make an appointment with your surgeon as instructed. You do not need to follow up with anesthesia unless specifically instructed to do so.  WOUND CARE INSTRUCTIONS (if applicable):  Keep a dry clean dressing on the anesthesia/puncture wound site if there is drainage.  Once the wound has quit draining you may leave it open to air.  Generally you should leave the bandage intact for twenty four hours unless there is drainage.  If the epidural site drains for more than 36-48 hours please call the anesthesia department.  QUESTIONS?:  Please feel free to call your physician or the hospital operator if you have any questions, and they will be happy to assist you.

## 2015-10-08 ENCOUNTER — Encounter (HOSPITAL_COMMUNITY)
Admission: RE | Admit: 2015-10-08 | Discharge: 2015-10-08 | Disposition: A | Discharge status: Home / Self Care | Payer: Commercial Managed Care - HMO | Source: Ambulatory Visit | Attending: Obstetrics & Gynecology | Admitting: Obstetrics & Gynecology

## 2015-10-08 ENCOUNTER — Encounter (HOSPITAL_COMMUNITY): Payer: Self-pay

## 2015-10-08 DIAGNOSIS — N921 Excessive and frequent menstruation with irregular cycle: Secondary | ICD-10-CM | POA: Insufficient documentation

## 2015-10-08 DIAGNOSIS — N946 Dysmenorrhea, unspecified: Secondary | ICD-10-CM | POA: Insufficient documentation

## 2015-10-08 DIAGNOSIS — Z01812 Encounter for preprocedural laboratory examination: Secondary | ICD-10-CM | POA: Diagnosis present

## 2015-10-08 DIAGNOSIS — R102 Pelvic and perineal pain: Secondary | ICD-10-CM | POA: Insufficient documentation

## 2015-10-08 HISTORY — DX: Gout, unspecified: M10.9

## 2015-10-08 LAB — COMPREHENSIVE METABOLIC PANEL
ALT: 18 U/L (ref 14–54)
AST: 21 U/L (ref 15–41)
Albumin: 3.8 g/dL (ref 3.5–5.0)
Alkaline Phosphatase: 86 U/L (ref 38–126)
Anion gap: 10 (ref 5–15)
BUN: 11 mg/dL (ref 6–20)
CHLORIDE: 109 mmol/L (ref 101–111)
CO2: 20 mmol/L — AB (ref 22–32)
Calcium: 9.3 mg/dL (ref 8.9–10.3)
Creatinine, Ser: 0.86 mg/dL (ref 0.44–1.00)
Glucose, Bld: 110 mg/dL — ABNORMAL HIGH (ref 65–99)
POTASSIUM: 3.7 mmol/L (ref 3.5–5.1)
SODIUM: 139 mmol/L (ref 135–145)
Total Bilirubin: 0.5 mg/dL (ref 0.3–1.2)
Total Protein: 6.8 g/dL (ref 6.5–8.1)

## 2015-10-08 LAB — TYPE AND SCREEN
ABO/RH(D): O POS
Antibody Screen: NEGATIVE

## 2015-10-08 LAB — CBC
HCT: 41.2 % (ref 36.0–46.0)
Hemoglobin: 13.1 g/dL (ref 12.0–15.0)
MCH: 21.2 pg — ABNORMAL LOW (ref 26.0–34.0)
MCHC: 31.8 g/dL (ref 30.0–36.0)
MCV: 66.7 fL — AB (ref 78.0–100.0)
PLATELETS: 390 10*3/uL (ref 150–400)
RBC: 6.18 MIL/uL — ABNORMAL HIGH (ref 3.87–5.11)
RDW: 19.6 % — AB (ref 11.5–15.5)
WBC: 9.4 10*3/uL (ref 4.0–10.5)

## 2015-10-08 LAB — HCG, QUANTITATIVE, PREGNANCY

## 2015-10-11 NOTE — Telephone Encounter (Signed)
Patient states that she does not need referral at this time.

## 2015-10-13 ENCOUNTER — Observation Stay (HOSPITAL_COMMUNITY)
Admission: RE | Admit: 2015-10-13 | Discharge: 2015-10-14 | Disposition: A | Discharge status: Home / Self Care | Payer: Commercial Managed Care - HMO | Source: Ambulatory Visit | Attending: Obstetrics & Gynecology | Admitting: Obstetrics & Gynecology

## 2015-10-13 ENCOUNTER — Encounter (HOSPITAL_COMMUNITY)
Admission: RE | Disposition: A | Discharge status: Home / Self Care | Payer: Self-pay | Source: Ambulatory Visit | Attending: Obstetrics & Gynecology

## 2015-10-13 ENCOUNTER — Inpatient Hospital Stay (HOSPITAL_COMMUNITY): Payer: Commercial Managed Care - HMO | Admitting: Anesthesiology

## 2015-10-13 ENCOUNTER — Encounter (HOSPITAL_COMMUNITY): Payer: Self-pay | Admitting: *Deleted

## 2015-10-13 DIAGNOSIS — N838 Other noninflammatory disorders of ovary, fallopian tube and broad ligament: Secondary | ICD-10-CM

## 2015-10-13 DIAGNOSIS — M069 Rheumatoid arthritis, unspecified: Secondary | ICD-10-CM | POA: Insufficient documentation

## 2015-10-13 DIAGNOSIS — D251 Intramural leiomyoma of uterus: Secondary | ICD-10-CM | POA: Diagnosis not present

## 2015-10-13 DIAGNOSIS — F329 Major depressive disorder, single episode, unspecified: Secondary | ICD-10-CM | POA: Insufficient documentation

## 2015-10-13 DIAGNOSIS — N8302 Follicular cyst of left ovary: Secondary | ICD-10-CM | POA: Diagnosis not present

## 2015-10-13 DIAGNOSIS — I509 Heart failure, unspecified: Secondary | ICD-10-CM | POA: Insufficient documentation

## 2015-10-13 DIAGNOSIS — N921 Excessive and frequent menstruation with irregular cycle: Secondary | ICD-10-CM | POA: Diagnosis present

## 2015-10-13 DIAGNOSIS — M5416 Radiculopathy, lumbar region: Secondary | ICD-10-CM | POA: Diagnosis not present

## 2015-10-13 DIAGNOSIS — N8301 Follicular cyst of right ovary: Secondary | ICD-10-CM | POA: Diagnosis not present

## 2015-10-13 DIAGNOSIS — N800 Endometriosis of uterus: Secondary | ICD-10-CM | POA: Diagnosis not present

## 2015-10-13 DIAGNOSIS — D259 Leiomyoma of uterus, unspecified: Secondary | ICD-10-CM | POA: Diagnosis not present

## 2015-10-13 DIAGNOSIS — D271 Benign neoplasm of left ovary: Secondary | ICD-10-CM | POA: Diagnosis not present

## 2015-10-13 DIAGNOSIS — D270 Benign neoplasm of right ovary: Secondary | ICD-10-CM | POA: Insufficient documentation

## 2015-10-13 DIAGNOSIS — M109 Gout, unspecified: Secondary | ICD-10-CM | POA: Insufficient documentation

## 2015-10-13 DIAGNOSIS — F17210 Nicotine dependence, cigarettes, uncomplicated: Secondary | ICD-10-CM | POA: Diagnosis not present

## 2015-10-13 DIAGNOSIS — M1990 Unspecified osteoarthritis, unspecified site: Secondary | ICD-10-CM | POA: Diagnosis not present

## 2015-10-13 DIAGNOSIS — K219 Gastro-esophageal reflux disease without esophagitis: Secondary | ICD-10-CM | POA: Diagnosis not present

## 2015-10-13 DIAGNOSIS — F419 Anxiety disorder, unspecified: Secondary | ICD-10-CM | POA: Insufficient documentation

## 2015-10-13 DIAGNOSIS — N946 Dysmenorrhea, unspecified: Secondary | ICD-10-CM | POA: Insufficient documentation

## 2015-10-13 DIAGNOSIS — M797 Fibromyalgia: Secondary | ICD-10-CM | POA: Diagnosis not present

## 2015-10-13 DIAGNOSIS — Z79899 Other long term (current) drug therapy: Secondary | ICD-10-CM | POA: Insufficient documentation

## 2015-10-13 DIAGNOSIS — Z886 Allergy status to analgesic agent status: Secondary | ICD-10-CM | POA: Insufficient documentation

## 2015-10-13 DIAGNOSIS — Z90710 Acquired absence of both cervix and uterus: Secondary | ICD-10-CM | POA: Diagnosis present

## 2015-10-13 HISTORY — PX: SALPINGOOPHORECTOMY: SHX82

## 2015-10-13 HISTORY — DX: Vitiligo: L80

## 2015-10-13 HISTORY — PX: VAGINAL HYSTERECTOMY: SHX2639

## 2015-10-13 SURGERY — HYSTERECTOMY, VAGINAL
Anesthesia: General

## 2015-10-13 MED ORDER — GLYCOPYRROLATE 0.2 MG/ML IJ SOLN
INTRAMUSCULAR | Status: AC
  Filled 2015-10-13: qty 1

## 2015-10-13 MED ORDER — GLYCOPYRROLATE 0.2 MG/ML IJ SOLN
INTRAMUSCULAR | Status: AC
  Filled 2015-10-13: qty 5

## 2015-10-13 MED ORDER — ZOLPIDEM TARTRATE 5 MG PO TABS: 5.0000 mg | ORAL_TABLET | Freq: Every evening | ORAL | Status: DC | PRN

## 2015-10-13 MED ORDER — LINACLOTIDE 145 MCG PO CAPS: 145.0000 ug | ORAL_CAPSULE | Freq: Every day | ORAL | Status: DC

## 2015-10-13 MED ORDER — FENTANYL CITRATE (PF) 100 MCG/2ML IJ SOLN
INTRAMUSCULAR | Status: DC | PRN
  Administered 2015-10-13: 50 ug via INTRAVENOUS
  Administered 2015-10-13: 100 ug via INTRAVENOUS
  Administered 2015-10-13 (×4): 50 ug via INTRAVENOUS

## 2015-10-13 MED ORDER — OXYCODONE-ACETAMINOPHEN 5-325 MG PO TABS
1.0000 | ORAL_TABLET | ORAL | Status: DC | PRN
  Administered 2015-10-13 – 2015-10-14 (×2): 2 via ORAL
  Filled 2015-10-13 (×2): qty 2

## 2015-10-13 MED ORDER — PROPOFOL 10 MG/ML IV BOLUS
INTRAVENOUS | Status: AC
  Filled 2015-10-13: qty 20

## 2015-10-13 MED ORDER — SODIUM CHLORIDE 0.9 % IR SOLN
Status: DC | PRN
  Administered 2015-10-13: 3000 mL
  Administered 2015-10-13: 1000 mL

## 2015-10-13 MED ORDER — NEOSTIGMINE METHYLSULFATE 10 MG/10ML IV SOLN
INTRAVENOUS | Status: DC | PRN
  Administered 2015-10-13: 1 mg via INTRAVENOUS
  Administered 2015-10-13: 2 mg via INTRAVENOUS
  Administered 2015-10-13: 1 mg via INTRAVENOUS

## 2015-10-13 MED ORDER — HYDROMORPHONE HCL 1 MG/ML IJ SOLN
1.0000 mg | INTRAMUSCULAR | Status: DC | PRN
  Administered 2015-10-13 – 2015-10-14 (×2): 1 mg via INTRAVENOUS
  Filled 2015-10-13 (×2): qty 1

## 2015-10-13 MED ORDER — LINACLOTIDE 145 MCG PO CAPS
145.0000 ug | ORAL_CAPSULE | Freq: Every day | ORAL | Status: DC
  Administered 2015-10-14: 145 ug via ORAL
  Filled 2015-10-13: qty 1

## 2015-10-13 MED ORDER — ONDANSETRON HCL 4 MG PO TABS
8.0000 mg | ORAL_TABLET | Freq: Four times a day (QID) | ORAL | Status: DC | PRN
  Administered 2015-10-13: 8 mg via ORAL
  Filled 2015-10-13: qty 2

## 2015-10-13 MED ORDER — KCL IN DEXTROSE-NACL 20-5-0.45 MEQ/L-%-% IV SOLN
INTRAVENOUS | Status: DC
  Administered 2015-10-13 – 2015-10-14 (×3): via INTRAVENOUS

## 2015-10-13 MED ORDER — ALUM & MAG HYDROXIDE-SIMETH 200-200-20 MG/5ML PO SUSP: 30.0000 mL | ORAL | Status: DC | PRN

## 2015-10-13 MED ORDER — LIDOCAINE HCL 1 % IJ SOLN
INTRAMUSCULAR | Status: DC | PRN
  Administered 2015-10-13: 25 mg via INTRADERMAL

## 2015-10-13 MED ORDER — DULOXETINE HCL 60 MG PO CPEP
60.0000 mg | ORAL_CAPSULE | Freq: Every day | ORAL | Status: DC
  Administered 2015-10-13 – 2015-10-14 (×2): 60 mg via ORAL
  Filled 2015-10-13 (×2): qty 1

## 2015-10-13 MED ORDER — PROPOFOL 10 MG/ML IV BOLUS
INTRAVENOUS | Status: DC | PRN
  Administered 2015-10-13: 140 mg via INTRAVENOUS

## 2015-10-13 MED ORDER — ONDANSETRON HCL 4 MG/2ML IJ SOLN: 4.0000 mg | Freq: Once | INTRAMUSCULAR | Status: DC | PRN

## 2015-10-13 MED ORDER — MIDAZOLAM HCL 2 MG/2ML IJ SOLN
INTRAMUSCULAR | Status: AC
  Filled 2015-10-13: qty 2

## 2015-10-13 MED ORDER — FENTANYL CITRATE (PF) 100 MCG/2ML IJ SOLN: 25.0000 ug | INTRAMUSCULAR | Status: DC | PRN

## 2015-10-13 MED ORDER — BUPIVACAINE-EPINEPHRINE (PF) 0.5% -1:200000 IJ SOLN
INTRAMUSCULAR | Status: DC | PRN
  Administered 2015-10-13: 20 mL

## 2015-10-13 MED ORDER — LIDOCAINE HCL (PF) 1 % IJ SOLN
INTRAMUSCULAR | Status: AC
  Filled 2015-10-13: qty 5

## 2015-10-13 MED ORDER — ROCURONIUM BROMIDE 50 MG/5ML IV SOLN
INTRAVENOUS | Status: AC
  Filled 2015-10-13: qty 1

## 2015-10-13 MED ORDER — ONDANSETRON HCL 4 MG/2ML IJ SOLN
INTRAMUSCULAR | Status: AC
  Filled 2015-10-13: qty 2

## 2015-10-13 MED ORDER — DOCUSATE SODIUM 100 MG PO CAPS
100.0000 mg | ORAL_CAPSULE | Freq: Two times a day (BID) | ORAL | Status: DC
  Administered 2015-10-13 – 2015-10-14 (×2): 100 mg via ORAL
  Filled 2015-10-13 (×2): qty 1

## 2015-10-13 MED ORDER — LACTATED RINGERS IV SOLN
INTRAVENOUS | Status: DC
  Administered 2015-10-13 (×2): via INTRAVENOUS
  Administered 2015-10-13: 1000 mL via INTRAVENOUS

## 2015-10-13 MED ORDER — DEXAMETHASONE SODIUM PHOSPHATE 4 MG/ML IJ SOLN
4.0000 mg | Freq: Once | INTRAMUSCULAR | Status: AC
  Administered 2015-10-13: 4 mg via INTRAVENOUS

## 2015-10-13 MED ORDER — ROCURONIUM BROMIDE 100 MG/10ML IV SOLN
INTRAVENOUS | Status: DC | PRN
  Administered 2015-10-13 (×2): 10 mg via INTRAVENOUS
  Administered 2015-10-13: 5 mg via INTRAVENOUS
  Administered 2015-10-13: 35 mg via INTRAVENOUS
  Administered 2015-10-13 (×2): 10 mg via INTRAVENOUS

## 2015-10-13 MED ORDER — SODIUM CHLORIDE 0.9 % IV SOLN
8.0000 mg | Freq: Four times a day (QID) | INTRAVENOUS | Status: DC | PRN
  Filled 2015-10-13: qty 4

## 2015-10-13 MED ORDER — GLYCOPYRROLATE 0.2 MG/ML IJ SOLN
INTRAMUSCULAR | Status: DC | PRN
  Administered 2015-10-13: 0.6 mg via INTRAVENOUS

## 2015-10-13 MED ORDER — GLYCOPYRROLATE 0.2 MG/ML IJ SOLN
0.2000 mg | Freq: Once | INTRAMUSCULAR | Status: AC
  Administered 2015-10-13: 0.2 mg via INTRAVENOUS

## 2015-10-13 MED ORDER — ONDANSETRON HCL 4 MG/2ML IJ SOLN
4.0000 mg | Freq: Once | INTRAMUSCULAR | Status: AC
  Administered 2015-10-13: 4 mg via INTRAVENOUS

## 2015-10-13 MED ORDER — NEOSTIGMINE METHYLSULFATE 10 MG/10ML IV SOLN
INTRAVENOUS | Status: AC
  Filled 2015-10-13: qty 3

## 2015-10-13 MED ORDER — SENNOSIDES-DOCUSATE SODIUM 8.6-50 MG PO TABS: 1.0000 | ORAL_TABLET | Freq: Every evening | ORAL | Status: DC | PRN

## 2015-10-13 MED ORDER — MIDAZOLAM HCL 5 MG/5ML IJ SOLN
INTRAMUSCULAR | Status: DC | PRN
  Administered 2015-10-13: 2 mg via INTRAVENOUS

## 2015-10-13 MED ORDER — DEXAMETHASONE SODIUM PHOSPHATE 4 MG/ML IJ SOLN
INTRAMUSCULAR | Status: AC
  Filled 2015-10-13: qty 1

## 2015-10-13 MED ORDER — PANTOPRAZOLE SODIUM 40 MG PO TBEC
40.0000 mg | DELAYED_RELEASE_TABLET | Freq: Every day | ORAL | Status: DC
  Administered 2015-10-13 – 2015-10-14 (×2): 40 mg via ORAL
  Filled 2015-10-13 (×2): qty 1

## 2015-10-13 MED ORDER — GABAPENTIN 300 MG PO CAPS
300.0000 mg | ORAL_CAPSULE | Freq: Three times a day (TID) | ORAL | Status: DC
  Administered 2015-10-13 – 2015-10-14 (×3): 300 mg via ORAL
  Filled 2015-10-13 (×3): qty 1

## 2015-10-13 MED ORDER — BISACODYL 10 MG RE SUPP: 10.0000 mg | Freq: Every day | RECTAL | Status: DC | PRN

## 2015-10-13 MED ORDER — MIDAZOLAM HCL 2 MG/2ML IJ SOLN
1.0000 mg | INTRAMUSCULAR | Status: DC | PRN
  Administered 2015-10-13: 2 mg via INTRAVENOUS

## 2015-10-13 MED ORDER — FENTANYL CITRATE (PF) 250 MCG/5ML IJ SOLN
INTRAMUSCULAR | Status: AC
  Filled 2015-10-13: qty 10

## 2015-10-13 MED ORDER — CEFAZOLIN SODIUM-DEXTROSE 2-3 GM-% IV SOLR
2.0000 g | INTRAVENOUS | Status: AC
  Administered 2015-10-13: 2 g via INTRAVENOUS
  Filled 2015-10-13: qty 50

## 2015-10-13 MED ORDER — BUPIVACAINE-EPINEPHRINE (PF) 0.5% -1:200000 IJ SOLN
INTRAMUSCULAR | Status: AC
  Filled 2015-10-13: qty 30

## 2015-10-13 SURGICAL SUPPLY — 57 items
APPLIER CLIP 13 LRG OPEN (CLIP) ×4
APR CLP LRG 13 20 CLIP (CLIP) ×2
BAG HAMPER (MISCELLANEOUS) ×4 IMPLANT
CELLS DAT CNTRL 66122 CELL SVR (MISCELLANEOUS) IMPLANT
CLIP APPLIE 13 LRG OPEN (CLIP) ×2 IMPLANT
CLOTH BEACON ORANGE TIMEOUT ST (SAFETY) ×4 IMPLANT
COVER LIGHT HANDLE STERIS (MISCELLANEOUS) ×8 IMPLANT
DECANTER SPIKE VIAL GLASS SM (MISCELLANEOUS) ×4 IMPLANT
DRAPE PROXIMA HALF (DRAPES) ×4 IMPLANT
DRAPE STERI URO 9X17 APER PCH (DRAPES) ×4 IMPLANT
DRAPE WARM FLUID 44X44 (DRAPE) IMPLANT
DRSG TELFA 3X8 NADH (GAUZE/BANDAGES/DRESSINGS) IMPLANT
ELECT REM PT RETURN 9FT ADLT (ELECTROSURGICAL) ×4
ELECTRODE REM PT RTRN 9FT ADLT (ELECTROSURGICAL) ×2 IMPLANT
FORMALIN 10 PREFIL 480ML (MISCELLANEOUS) ×4 IMPLANT
GAUZE PACKING 2X5 YD STRL (GAUZE/BANDAGES/DRESSINGS) IMPLANT
GLOVE BIOGEL PI IND STRL 7.0 (GLOVE) ×10 IMPLANT
GLOVE BIOGEL PI IND STRL 7.5 (GLOVE) ×2 IMPLANT
GLOVE BIOGEL PI IND STRL 8 (GLOVE) ×2 IMPLANT
GLOVE BIOGEL PI INDICATOR 7.0 (GLOVE) ×10
GLOVE BIOGEL PI INDICATOR 7.5 (GLOVE) ×2
GLOVE BIOGEL PI INDICATOR 8 (GLOVE) ×2
GLOVE ECLIPSE 6.5 STRL STRAW (GLOVE) ×8 IMPLANT
GLOVE ECLIPSE 8.0 STRL XLNG CF (GLOVE) ×4 IMPLANT
GOWN STRL REUS W/TWL LRG LVL3 (GOWN DISPOSABLE) ×8 IMPLANT
GOWN STRL REUS W/TWL XL LVL3 (GOWN DISPOSABLE) ×4 IMPLANT
INST SET MAJOR GENERAL (KITS) ×4 IMPLANT
IV NS IRRIG 3000ML ARTHROMATIC (IV SOLUTION) ×4 IMPLANT
KIT BLADEGUARD II DBL (SET/KITS/TRAYS/PACK) ×4 IMPLANT
KIT ROOM TURNOVER AP CYSTO (KITS) ×4 IMPLANT
KIT ROOM TURNOVER APOR (KITS) ×4 IMPLANT
LIQUID BAND (GAUZE/BANDAGES/DRESSINGS) IMPLANT
MANIFOLD NEPTUNE II (INSTRUMENTS) ×4 IMPLANT
NEEDLE HYPO 22GX1.5 SAFETY (NEEDLE) ×4 IMPLANT
NS IRRIG 1000ML POUR BTL (IV SOLUTION) ×8 IMPLANT
PACK ABDOMINAL MAJOR (CUSTOM PROCEDURE TRAY) ×4 IMPLANT
PACK PERI GYN (CUSTOM PROCEDURE TRAY) ×4 IMPLANT
PAD ARMBOARD 7.5X6 YLW CONV (MISCELLANEOUS) ×4 IMPLANT
RETRACTOR WND ALEXIS 25 LRG (MISCELLANEOUS) IMPLANT
RTRCTR WOUND ALEXIS 18CM MED (MISCELLANEOUS)
RTRCTR WOUND ALEXIS 25CM LRG (MISCELLANEOUS)
SET BASIN LINEN APH (SET/KITS/TRAYS/PACK) ×4 IMPLANT
STAPLER VISISTAT 35W (STAPLE) IMPLANT
SUT CHROMIC 0 CT 1 (SUTURE) IMPLANT
SUT MNCRL+ AB 3-0 CT1 36 (SUTURE) IMPLANT
SUT MON AB 3-0 SH 27 (SUTURE) IMPLANT
SUT MONOCRYL AB 3-0 CT1 36IN (SUTURE)
SUT VIC AB 0 CT1 27 (SUTURE) ×16
SUT VIC AB 0 CT1 27XBRD ANTBC (SUTURE) IMPLANT
SUT VIC AB 0 CT1 27XCR 8 STRN (SUTURE) ×8 IMPLANT
SUT VIC AB 0 CTX 36 (SUTURE)
SUT VIC AB 0 CTX36XBRD ANTBCTR (SUTURE) IMPLANT
SUT VICRYL 3 0 (SUTURE) IMPLANT
SYR CONTROL 10ML LL (SYRINGE) ×4 IMPLANT
TRAY FOLEY CATH SILVER 16FR (SET/KITS/TRAYS/PACK) ×4 IMPLANT
VERSALIGHT (MISCELLANEOUS) ×4 IMPLANT
WATER STERILE IRR 1000ML POUR (IV SOLUTION) ×4 IMPLANT

## 2015-10-13 NOTE — Op Note (Signed)
Preoperative diagnosis:  1.  menometrorrhagia                                         2.  dysmenorrhea                                         3.  Pelvic pain                                         4.  S/p ablation  Postoperative diagnosis:  Same as above + minimal endometriosis  Procedure:  Vaginal hysterectomy with removal of both tubes and ovaries  Surgeon:  Florian Buff MD  Anesthesia:  General Endotracheal  Findings:    Intraoperative findings were minimal endometriosis, small uterine myomata  Description of operation:  The patient was taken from the preoperative area to the operating room in stable condition.  She underwent GET anesthesia. Patient was placed in the dorsal lithotomy position using candy cane stirrups.  Patient was prepped and draped in the usual sterile fashion and a Foley catheter was placed.  A weighted speculum was placed and the cervix was grasped with thyroid tenaculums both anteriorly and posteriorly.  0.5% Marcaine plain was injected in a circumferential fashion about the cervix and the electrocautery unit was used to incise the vagina and push at all cervix.  The posterior cul-de-sac was then entered sharply without difficulty.  The uterosacral ligaments were clamped cut and inspection suture ligated and held.  The cardinal ligaments were then clamped cut transfixion suture ligated and cut. The anterior peritoneum was identified the anterior cul-de-sac was entered sharply without difficulty. The anterior and posterior leaves of the broad ligament were plicated and the uterine vessels were clamped cut and suture ligated. Serial pedicles were taken up the fundus with each pedicle being clamped cut and suture ligated.  Uterine morcellation was performed. The utero-ovarian ligaments were crossclamped the uterus was removed and both pedicles were transfixion suture ligated. The infundibulo pelvic ligaments were then cross clamped bilaterally.  The tubes and ovaries were  then removed bilaterally.  Fore and aft sutures were then placed and large hemoclips for hemostasis.  There was good hemostasis of all the pedicles. The peritoneum was then closed in a pursestring fashion using 3-0 Vicryl. The anterior posterior vagina was closed in interrupted fashion with good resultant hemostasis. Our closure the lower pelvis and vagina were irrigated vigorously.  The sponge needle and instrument counts were correct x 3.  Total blood loss for the procedure was 100 cc.  The patient received 2 g of Ancef  IV preoperatively prophylactically.  She was taken to the recovery room in good stable condition awake alert doing well.  Bailee Metter H 10/13/2015, 10:27 AM

## 2015-10-13 NOTE — Transfer of Care (Signed)
Immediate Anesthesia Transfer of Care Note  Patient: Lynn Logan  Procedure(s) Performed: Procedure(s): HYSTERECTOMY VAGINAL (N/A) BILATERAL SALPINGO OOPHORECTOMY (Bilateral)  Patient Location: PACU  Anesthesia Type:General  Level of Consciousness: awake and patient cooperative  Airway & Oxygen Therapy: Patient Spontanous Breathing and Patient connected to face mask oxygen  Post-op Assessment: Report given to RN, Post -op Vital signs reviewed and stable and Patient moving all extremities  Post vital signs: Reviewed and stable  Last Vitals:  Filed Vitals:   10/13/15 0725 10/13/15 0730  BP: 118/82 128/88  Temp:    Resp: 18 11    Complications: No apparent anesthesia complications

## 2015-10-13 NOTE — Progress Notes (Signed)
2045 Foley catheter removed as ordered w/o difficulty. 500cc yellow/straw urine noted in catheter drainage bag. Peri-care given, frank red blood noted on peri-pad (moderate amount) and external vagina and inner thigh region. Areas cleansed and patient assisted to bathroom and voided 25cc frank red blood urine, patient c/o burning while urinating. Patient has also had one episode of green colored fluid emesis measuring 700cc and reported feeling relieved after vomiting.

## 2015-10-13 NOTE — Anesthesia Postprocedure Evaluation (Signed)
Anesthesia Post Note  Patient: [name]  Procedure(s) Performed: Procedure(s) (LRB): HYSTERECTOMY VAGINAL (N/A) BILATERAL SALPINGO OOPHORECTOMY (Bilateral)  Patient location during evaluation: PACU Anesthesia Type: General Level of consciousness: awake and alert, patient cooperative and oriented Pain management: pain level controlled Vital Signs Assessment: post-procedure vital signs reviewed and stable Respiratory status: spontaneous breathing and nonlabored ventilation Cardiovascular status: blood pressure returned to baseline and stable Postop Assessment: no signs of nausea or vomiting Anesthetic complications: no    Last Vitals:  Filed Vitals:   10/13/15 1030 10/13/15 1045  BP: 132/75 126/74  Pulse: 74 70  Temp:    Resp: 19 20    Last Pain:  Filed Vitals:   10/13/15 1059  PainSc: Asleep                 Lynn Logan

## 2015-10-13 NOTE — Anesthesia Procedure Notes (Signed)
Procedure Name: Intubation Date/Time: 10/13/2015 7:54 AM Performed by: Charmaine Downs Pre-anesthesia Checklist: Emergency Drugs available, Patient identified, Patient being monitored and Suction available Patient Re-evaluated:Patient Re-evaluated prior to inductionOxygen Delivery Method: Circle system utilized Preoxygenation: Pre-oxygenation with 100% oxygen Intubation Type: IV induction Ventilation: Oral airway inserted - appropriate to patient size and Mask ventilation without difficulty Laryngoscope Size: Mac and 3 Grade View: Grade II Tube type: Oral Tube size: 7.0 mm Number of attempts: 1 Airway Equipment and Method: Stylet and Oral airway Placement Confirmation: ETT inserted through vocal cords under direct vision,  positive ETCO2 and breath sounds checked- equal and bilateral Secured at: 22 cm Tube secured with: Tape Dental Injury: Teeth and Oropharynx as per pre-operative assessment

## 2015-10-13 NOTE — H&P (Signed)
Preoperative History and Physical  Lynn Logan is a 45 y.o. No obstetric history on file. with Patient's last menstrual period was 09/12/2015 (exact date). admitted for a vaginal hysterectomy with removal of both tubes and ovaries.  Pt had an endometrial ablation 07/07/2015 with known fibroids but decided to try the more conservative route but she has continued to have bleeding and cramping and pelvic pain.  I have tried megestrol without positive effect  PMH:    Past Medical History  Diagnosis Date  . CHF (congestive heart failure) ([place])   . Anxiety   . Depression   . Chronic back pain   . Right lumbar radiculopathy   . Fibromyalgia   . Arthritis     Rheumatoid  . Gout     PSH:     Past Surgical History  Procedure Laterality Date  . Tubal ligation    . Cesarean section    . Dilitation & currettage/hystroscopy with novasure ablation N/A 07/07/2015    Procedure: HYSTEROSCOPY, UTERINE CURETTAGE, ENDOMETRIAL  ABLATION Uterine Cavity Length=6.5cm Uterine Cavity Width=4.5cm Power=161 Watts Time=1 minute 19 seconds;  Surgeon: Florian Buff, MD;  Location: [facility];  Service: Gynecology;  Laterality: N/A;  . Polypectomy  07/07/2015    Procedure: POLYPECTOMY;  Surgeon: Florian Buff, MD;  Location: [facility];  Service: Gynecology;;    POb/GynH:      OB History    No data available      SH:   Social History  Substance Use Topics  . Smoking status: Current Every Day Smoker -- 1.00 packs/day for 30 years    Types: Cigarettes  . Smokeless tobacco: Former Systems developer  . Alcohol Use: No    FH:    Family History  Problem Relation Age of Onset  . Diabetes Mother   . Congestive Heart Failure Mother   . Depression Father   . Hypertension Father   . Cancer Father   . Heart disease Maternal Uncle      Allergies:  Allergies  Allergen Reactions  . Toradol [Ketorolac Tromethamine] Itching    Medications:       Current facility-administered medications:  .  ceFAZolin (ANCEF) IVPB  2 g/50 mL premix, 2 g, Intravenous, On Call to OR, Florian Buff, MD .  lactated ringers infusion, , Intravenous, Continuous, Lerry Liner, MD, Last Rate: 75 mL/hr at 10/13/15 0700, 1,000 mL at 10/13/15 0700 .  midazolam (VERSED) injection 1-2 mg, 1-2 mg, Intravenous, Q5 min PRN, Lerry Liner, MD, 2 mg at 10/13/15 [id number]  Review of Systems:   Review of Systems  Constitutional: Negative for fever, chills, weight loss, malaise/fatigue and diaphoresis.  HENT: Negative for hearing loss, ear pain, nosebleeds, congestion, sore throat, neck pain, tinnitus and ear discharge.   Eyes: Negative for blurred vision, double vision, photophobia, pain, discharge and redness.  Respiratory: Negative for cough, hemoptysis, sputum production, shortness of breath, wheezing and stridor.   Cardiovascular: Negative for chest pain, palpitations, orthopnea, claudication, leg swelling and PND.  Gastrointestinal: Positive for abdominal pain. Negative for heartburn, nausea, vomiting, diarrhea, constipation, blood in stool and melena.  Genitourinary: Negative for dysuria, urgency, frequency, hematuria and flank pain.  Musculoskeletal: Negative for myalgias, back pain, joint pain and falls.  Skin: Negative for itching and rash.  Neurological: Negative for dizziness, tingling, tremors, sensory change, speech change, focal weakness, seizures, loss of consciousness, weakness and headaches.  Endo/Heme/Allergies: Negative for environmental allergies and polydipsia. Does not bruise/bleed easily.  Psychiatric/Behavioral: Negative for depression, suicidal ideas,  hallucinations, memory loss and substance abuse. The patient is not nervous/anxious and does not have insomnia.      PHYSICAL EXAM:  Blood pressure 120/82, temperature 98.2 F (36.8 C), temperature source Oral, resp. rate 18, last menstrual period 09/12/2015, SpO2 97 %.    Vitals reviewed. Constitutional: She is oriented to person, place, and time. She appears  well-developed and well-nourished.  HENT:  Head: Normocephalic and atraumatic.  Right Ear: External ear normal.  Left Ear: External ear normal.  Nose: Nose normal.  Mouth/Throat: Oropharynx is clear and moist.  Eyes: Conjunctivae and EOM are normal. Pupils are equal, round, and reactive to light. Right eye exhibits no discharge. Left eye exhibits no discharge. No scleral icterus.  Neck: Normal range of motion. Neck supple. No tracheal deviation present. No thyromegaly present.  Cardiovascular: Normal rate, regular rhythm, normal heart sounds and intact distal pulses.  Exam reveals no gallop and no friction rub.   No murmur heard. Respiratory: Effort normal and breath sounds normal. No respiratory distress. She has no wheezes. She has no rales. She exhibits no tenderness.  GI: Soft. Bowel sounds are normal. She exhibits no distension and no mass. There is tenderness. There is no rebound and no guarding.  Genitourinary:       Vulva is normal without lesions Vagina is pink moist without discharge Cervix normal in appearance and pap is normal Uterus is normal size, contour, position, consistency, mobility, non-tender Adnexa is negative with normal sized ovaries by sonogram  Musculoskeletal: Normal range of motion. She exhibits no edema and no tenderness.  Neurological: She is alert and oriented to person, place, and time. She has normal reflexes. She displays normal reflexes. No cranial nerve deficit. She exhibits normal muscle tone. Coordination normal.  Skin: Skin is warm and dry. No rash noted. No erythema. No pallor.  Psychiatric: She has a normal mood and affect. Her behavior is normal. Judgment and thought content normal.    Labs: Results for orders placed or performed during the hospital encounter of 10/08/15 (from the past 336 hour(s))  CBC   Collection Time: 10/08/15  9:30 AM  Result Value Ref Range   WBC 9.4 4.0 - 10.5 K/uL   RBC 6.18 (H) 3.87 - 5.11 MIL/uL   Hemoglobin 13.1  12.0 - 15.0 g/dL   HCT 41.2 36.0 - 46.0 %   MCV 66.7 (L) 78.0 - 100.0 fL   MCH 21.2 (L) 26.0 - 34.0 pg   MCHC 31.8 30.0 - 36.0 g/dL   RDW 19.6 (H) 11.5 - 15.5 %   Platelets 390 150 - 400 K/uL  Comprehensive metabolic panel   Collection Time: 10/08/15  9:30 AM  Result Value Ref Range   Sodium 139 135 - 145 mmol/L   Potassium 3.7 3.5 - 5.1 mmol/L   Chloride 109 101 - 111 mmol/L   CO2 20 (L) 22 - 32 mmol/L   Glucose, Bld 110 (H) 65 - 99 mg/dL   BUN 11 6 - 20 mg/dL   Creatinine, Ser 0.86 0.44 - 1.00 mg/dL   Calcium 9.3 8.9 - 10.3 mg/dL   Total Protein 6.8 6.5 - 8.1 g/dL   Albumin 3.8 3.5 - 5.0 g/dL   AST 21 15 - 41 U/L   ALT 18 14 - 54 U/L   Alkaline Phosphatase 86 38 - 126 U/L   Total Bilirubin 0.5 0.3 - 1.2 mg/dL   GFR calc non Af Amer >60 >60 mL/min   GFR calc Af Amer >60 >60 mL/min  Anion gap 10 5 - 15  hCG, quantitative, pregnancy   Collection Time: 10/08/15  9:30 AM  Result Value Ref Range   hCG, Beta Chain, Quant, S <1 <5 mIU/mL  Type and screen   Collection Time: 10/08/15  9:30 AM  Result Value Ref Range   ABO/RH(D) O POS    Antibody Screen NEG    Sample Expiration 10/22/2015     EKG: Orders placed or performed during the hospital encounter of 06/21/14  . EKG 12-Lead  . EKG 12-Lead  . ED EKG  . ED EKG  . EKG    Imaging Studies: US Transvaginal Non-ob  10-23-2015  GYNECOLOGIC SONOGRAM YESINA LUCIA is a 45 y.o. LMP 09/12/2015 for a pelvic sonogram for vag.bleeding s/p ablation. Uterus                      9.9 x 5.5x 6.5 cm, heterogenous anteverted uterus w/mult fibroids, (#1) subserosal fibroid ant fundal 1.2 x 1.2 x .8 cm                                                                                (#2)ant.submucosal fibroid .6 x .6 x .6cm, (#3) post rt intramural fibroid .9 x .7 x 1 cm Endometrium          16 mm, symmetrical,  1.3 x 1.3 x .9cm endometrial polyp w/ a trace of  fluid with in the endometrium Right ovary             2.9 x 3.1 x 1.5 cm, wnl Left  ovary                3.4 x 3.4 x 1.9 cm, wnl Technician Comments: PELVIC US TA/TV:heterogenous anteverted uterus w/mult fibroids, (#1) subserosal fibroid ant fundal 1.2 x 1.2 x .8cm,(#2)ant.submucosal fibroid .6 x .6 x .6cm, (#3) post rt intramural .9 x .7 x 1 cm, EEC 16 mm w/ a 1.3 x 1.3 x .9cm endometrial polyp,normal ov's bilat (mobile),no free fluid,no pain during ultrasound [employer] 10-23-2015 9:21 AM Clinical Impression and recommendations: I have reviewed the sonogram results above, combined with the patient's current clinical course, below are my impressions and any appropriate recommendations for management based on the sonographic findings. Endometrium suprisingly thick in a patient s/p ablation Fibroids stable Small insignificant polyp Ovaries are normal EURE,LUTHER H October 23, 2015 9:32 AM   US Pelvis Complete  10/23/2015  GYNECOLOGIC SONOGRAM JAMIERA PHI is a 45 y.o. LMP 09/12/2015 for a pelvic sonogram for vag.bleeding s/p ablation. Uterus                      9.9 x 5.5x 6.5 cm, heterogenous anteverted uterus w/mult fibroids, (#1) subserosal fibroid ant fundal 1.2 x 1.2 x .8 cm                                                                                (#  2)ant.submucosal fibroid .6 x .6 x .6cm, (#3) post rt intramural fibroid .9 x .7 x 1 cm Endometrium          16 mm, symmetrical,  1.3 x 1.3 x .9cm endometrial polyp w/ a trace of  fluid with in the endometrium Right ovary             2.9 x 3.1 x 1.5 cm, wnl Left ovary                3.4 x 3.4 x 1.9 cm, wnl Technician Comments: PELVIC US TA/TV:heterogenous anteverted uterus w/mult fibroids, (#1) subserosal fibroid ant fundal 1.2 x 1.2 x .8cm,(#2)ant.submucosal fibroid .6 x .6 x .6cm, (#3) post rt intramural .9 x .7 x 1 cm, EEC 16 mm w/ a 1.3 x 1.3 x .9cm endometrial polyp,normal ov's bilat (mobile),no free fluid,no pain during ultrasound [employer] 10/01/2015 9:21 AM Clinical Impression and recommendations: I have reviewed the sonogram results  above, combined with the patient's current clinical course, below are my impressions and any appropriate recommendations for management based on the sonographic findings. Endometrium suprisingly thick in a patient s/p ablation Fibroids stable Small insignificant polyp Ovaries are normal EURE,LUTHER H 10/01/2015 9:32 AM      Assessment: Menometrorrhagia Dysmenorrhea Pelvic pain S/p endometrial ablation Patient Active Problem List   Diagnosis Date Noted  . Right arm pain 06/24/2015  . Right leg pain 06/24/2015  . Back pain 06/11/2015  . Menorrhagia 05/21/2015  . Mallory-Weiss tear 05/21/2015  . Peripheral neuropathy ([place]) 05/21/2015  . Depression 05/21/2015    Plan: Vaginal hysterectomy removal of both tubes and ovaries  EURE,LUTHER H 10/13/2015 7:33 AM

## 2015-10-13 NOTE — Anesthesia Preprocedure Evaluation (Signed)
Anesthesia Evaluation  Patient identified by MRN, date of birth, ID band Patient awake    Reviewed: Allergy & Precautions, NPO status , Patient's Chart, lab work & pertinent test results  History of Anesthesia Complications Negative for: history of anesthetic complications  Airway Mallampati: II  TM Distance: >3 FB Neck ROM: Full    Dental  (+) Teeth Intact   Pulmonary Current Smoker,    Pulmonary exam normal        Cardiovascular negative cardio ROS Normal cardiovascular exam     Neuro/Psych Anxiety Depression  Neuromuscular disease    GI/Hepatic Neg liver ROS, GERD  Medicated and Controlled,  Endo/Other  negative endocrine ROS  Renal/GU negative Renal ROS  negative genitourinary   Musculoskeletal  (+) Arthritis , Osteoarthritis,  Fibromyalgia -  Abdominal Normal abdominal exam  (+) + obese,   Peds  Hematology negative hematology ROS (+)   Anesthesia Other Findings   Reproductive/Obstetrics negative OB ROS                             Anesthesia Physical Anesthesia Plan  ASA: II  Anesthesia Plan: General   Post-op Pain Management:    Induction: Intravenous  Airway Management Planned: Oral ETT  Additional Equipment:   Intra-op Plan:   Post-operative Plan: Extubation in OR  Informed Consent: I have reviewed the patients History and Physical, chart, labs and discussed the procedure including the risks, benefits and alternatives for the proposed anesthesia with the patient or authorized representative who has indicated his/her understanding and acceptance.   Dental advisory given  Plan Discussed with: CRNA  Anesthesia Plan Comments:         Anesthesia Quick Evaluation

## 2015-10-14 ENCOUNTER — Encounter (HOSPITAL_COMMUNITY): Payer: Self-pay | Admitting: Obstetrics & Gynecology

## 2015-10-14 DIAGNOSIS — N921 Excessive and frequent menstruation with irregular cycle: Secondary | ICD-10-CM | POA: Diagnosis not present

## 2015-10-14 LAB — CBC
HCT: 36.4 % (ref 36.0–46.0)
HEMOGLOBIN: 11.5 g/dL — AB (ref 12.0–15.0)
MCH: 21.6 pg — AB (ref 26.0–34.0)
MCHC: 31.6 g/dL (ref 30.0–36.0)
MCV: 68.4 fL — AB (ref 78.0–100.0)
PLATELETS: 326 10*3/uL (ref 150–400)
RBC: 5.32 MIL/uL — AB (ref 3.87–5.11)
RDW: 18.4 % — ABNORMAL HIGH (ref 11.5–15.5)
WBC: 15.3 10*3/uL — AB (ref 4.0–10.5)

## 2015-10-14 LAB — BASIC METABOLIC PANEL
ANION GAP: 7 (ref 5–15)
BUN: 7 mg/dL (ref 6–20)
CHLORIDE: 109 mmol/L (ref 101–111)
CO2: 24 mmol/L (ref 22–32)
Calcium: 8.4 mg/dL — ABNORMAL LOW (ref 8.9–10.3)
Creatinine, Ser: 0.68 mg/dL (ref 0.44–1.00)
GFR calc Af Amer: >60 mL/min (ref >60)
Glucose, Bld: 126 mg/dL — ABNORMAL HIGH (ref 65–99)
POTASSIUM: 3.8 mmol/L (ref 3.5–5.1)
Sodium: 140 mmol/L (ref 135–145)

## 2015-10-14 MED ORDER — ONDANSETRON HCL 8 MG PO TABS: 8.0000 mg | ORAL_TABLET | Freq: Four times a day (QID) | ORAL | Status: DC | PRN

## 2015-10-14 MED ORDER — CIPROFLOXACIN HCL 500 MG PO TABS: 500.0000 mg | ORAL_TABLET | Freq: Two times a day (BID) | ORAL | Status: DC

## 2015-10-14 MED ORDER — OXYCODONE-ACETAMINOPHEN 5-325 MG PO TABS: 1.0000 | ORAL_TABLET | ORAL | Status: DC | PRN

## 2015-10-14 MED ORDER — LEVOFLOXACIN IN D5W 750 MG/150ML IV SOLN
750.0000 mg | Freq: Once | INTRAVENOUS | Status: AC
  Administered 2015-10-14: 750 mg via INTRAVENOUS
  Filled 2015-10-14: qty 150

## 2015-10-14 NOTE — Care Management Note (Signed)
Case Management Note  Patient Details  Name: Lynn Logan MRN: [id number] Date of Birth: 16-Jul-1971  Subjective/Objective:                  Pt admitted s/p vaginal hysterectomy. Pt is from home, lives alone and is ind with ADL's. Pt has no HH or DME prior to admission. Pt plans to return home with self care at DC.   Action/Plan: Pt DC home today, no CM needs.   Expected Discharge Date:  10/14/15               Expected Discharge Plan:  Home/Self Care  In-House Referral:     Discharge planning Services  CM Consult  Post Acute Care Choice:  NA Choice offered to:  NA  DME Arranged:    DME Agency:     HH Arranged:    HH Agency:     Status of Service:  Completed, signed off  Medicare Important Message Given:    Date Medicare IM Given:    Medicare IM give by:    Date Additional Medicare IM Given:    Additional Medicare Important Message give by:     If discussed at [place] of Stay Meetings, dates discussed:    Additional Comments:  Sherald Barge, RN 10/14/2015, 1:35 PM

## 2015-10-14 NOTE — Care Management Obs Status (Signed)
[facility] NOTIFICATION   Patient Details  Name: Lynn Logan MRN: [id number] Date of Birth: 25-Aug-1971   Medicare Observation Status Notification Given:  Yes    Sherald Barge, RN 10/14/2015, 1:34 PM

## 2015-10-14 NOTE — Discharge Summary (Signed)
Physician Discharge Summary  Patient ID: Lynn Logan MRN: [id number] DOB/AGE: 46-Apr-1972 46 y.o.  Admit date: 10/13/2015 Discharge date: 10/14/2015  Admission Diagnoses: AUB, dysmenorrhea  Discharge Diagnoses:  Active Problems:   S/P vaginal hysterectomy BSO  Discharged Condition: good  Hospital Course: unremarkable post op course  Consults: None  Significant Diagnostic Studies: labs:   Results for orders placed or performed during the hospital encounter of 10/13/15 (from the past 48 hour(s))  CBC     Status: Abnormal   Collection Time: 10/14/15  5:52 AM  Result Value Ref Range   WBC 15.3 (H) 4.0 - 10.5 K/uL   RBC 5.32 (H) 3.87 - 5.11 MIL/uL   Hemoglobin 11.5 (L) 12.0 - 15.0 g/dL   HCT 36.4 36.0 - 46.0 %   MCV 68.4 (L) 78.0 - 100.0 fL   MCH 21.6 (L) 26.0 - 34.0 pg   MCHC 31.6 30.0 - 36.0 g/dL   RDW 18.4 (H) 11.5 - 15.5 %   Platelets 326 150 - 400 K/uL  Basic metabolic panel     Status: Abnormal   Collection Time: 10/14/15  5:52 AM  Result Value Ref Range   Sodium 140 135 - 145 mmol/L   Potassium 3.8 3.5 - 5.1 mmol/L   Chloride 109 101 - 111 mmol/L   CO2 24 22 - 32 mmol/L   Glucose, Bld 126 (H) 65 - 99 mg/dL   BUN 7 6 - 20 mg/dL   Creatinine, Ser 0.68 0.44 - 1.00 mg/dL   Calcium 8.4 (L) 8.9 - 10.3 mg/dL   GFR calc non Af Amer >60 >60 mL/min   GFR calc Af Amer >60 >60 mL/min    Comment: (NOTE) The eGFR has been calculated using the CKD EPI equation. This calculation has not been validated in all clinical situations. eGFR's persistently <60 mL/min signify possible Chronic Kidney Disease.    Anion gap 7 5 - 15    Treatments: surgery: TVHBSO  Discharge Exam: Blood pressure 113/55, pulse 71, temperature 98.4 F (36.9 C), temperature source Oral, resp. rate 20, height '5\' 2"'$  (1.575 m), last menstrual period 09/12/2015, SpO2 97 %. General appearance: alert, cooperative and no distress GI: soft, non-tender; bowel sounds normal; no masses,  no  organomegaly  Disposition: 01-Home or Self Care  Discharge Instructions    Call MD for:  persistant nausea and vomiting    Complete by:  As directed      Call MD for:  severe uncontrolled pain    Complete by:  As directed      Call MD for:  temperature >100.4    Complete by:  As directed      Call MD for:    Complete by:  As directed   Excessive bleeding     Diet - low sodium heart healthy    Complete by:  As directed      Driving Restrictions    Complete by:  As directed   No driving for 1 week     Increase activity slowly    Complete by:  As directed      Lifting restrictions    Complete by:  As directed   Do not lift more than 10 pounds     Sexual Activity Restrictions    Complete by:  As directed   No sex for 2 months            Medication List    STOP taking these medications  megestrol 40 MG tablet  Commonly known as:  MEGACE      TAKE these medications        diclofenac sodium 1 % Gel  Commonly known as:  VOLTAREN  Apply 2 g topically 4 (four) times daily.     DULoxetine 60 MG capsule  Commonly known as:  CYMBALTA  Take 1 capsule (60 mg total) by mouth daily.     gabapentin 300 MG capsule  Commonly known as:  NEURONTIN  Take 1 capsule (300 mg total) by mouth 3 (three) times daily.     LINZESS 145 MCG Caps capsule  Generic drug:  Linaclotide  Take 145 mcg by mouth daily.     omeprazole 20 MG capsule  Commonly known as:  PRILOSEC  Take 1 capsule (20 mg total) by mouth daily. 1 tablet a day     ondansetron 8 MG tablet  Commonly known as:  ZOFRAN  Take 1 tablet (8 mg total) by mouth every 6 (six) hours as needed for nausea.     oxyCODONE-acetaminophen 5-325 MG tablet  Commonly known as:  PERCOCET/ROXICET  Take 1-2 tablets by mouth every 4 (four) hours as needed (moderate to severe pain (when tolerating fluids)).           Follow-up Information    Follow up with Florian Buff, MD In 1 week.   Specialties:  Obstetrics and Gynecology,  Radiology   Why:  post op visit   Contact information:   [place] [address] [phone]       Signed: Florian Buff 10/14/2015, 12:17 PM

## 2015-10-14 NOTE — Progress Notes (Signed)
Patient states understanding of discharge instructions, prescription given. 

## 2015-10-20 ENCOUNTER — Encounter: Payer: Self-pay | Admitting: Obstetrics & Gynecology

## 2015-10-20 ENCOUNTER — Ambulatory Visit (INDEPENDENT_AMBULATORY_CARE_PROVIDER_SITE_OTHER): Payer: Commercial Managed Care - HMO | Admitting: Obstetrics & Gynecology

## 2015-10-20 VITALS — BP 112/80 | HR 74 | Ht 62.0 in | Wt 193.0 lb

## 2015-10-20 DIAGNOSIS — Z90710 Acquired absence of both cervix and uterus: Secondary | ICD-10-CM

## 2015-10-20 DIAGNOSIS — Z9889 Other specified postprocedural states: Secondary | ICD-10-CM

## 2015-10-20 DIAGNOSIS — Z90722 Acquired absence of ovaries, bilateral: Secondary | ICD-10-CM

## 2015-10-20 MED ORDER — OXYCODONE-ACETAMINOPHEN 7.5-325 MG PO TABS: 1.0000 | ORAL_TABLET | ORAL | Status: DC | PRN

## 2015-10-20 MED ORDER — ESTRADIOL 2 MG PO TABS
2.0000 mg | ORAL_TABLET | Freq: Every day | ORAL | Status: DC
Start: 2015-10-20 — End: 2015-11-26

## 2015-10-20 NOTE — Progress Notes (Signed)
Patient ID: Lynn Logan, female   DOB: Mar 11, 1971, 45 y.o.   MRN: [id number]  HPI: Patient returns for routine postoperative follow-up having undergone TVHBSO on 10/13/2015.  The patient's immediate postoperative recovery has been unremarkable. Since hospital discharge the patient reports no bleeding still having some pain.   Current Outpatient Prescriptions: diclofenac sodium (VOLTAREN) 1 % GEL, Apply 2 g topically 4 (four) times daily., Disp: 100 g, Rfl: 2 DULoxetine (CYMBALTA) 60 MG capsule, Take 1 capsule (60 mg total) by mouth daily., Disp: 30 capsule, Rfl: 3 gabapentin (NEURONTIN) 300 MG capsule, Take 1 capsule (300 mg total) by mouth 3 (three) times daily., Disp: 90 capsule, Rfl: 3 LINZESS 145 MCG CAPS capsule, Take 145 mcg by mouth daily. , Disp: , Rfl:  omeprazole (PRILOSEC) 20 MG capsule, Take 1 capsule (20 mg total) by mouth daily. 1 tablet a day, Disp: 30 capsule, Rfl: 6 ondansetron (ZOFRAN) 8 MG tablet, Take 1 tablet (8 mg total) by mouth every 6 (six) hours as needed for nausea., Disp: 20 tablet, Rfl: 0 oxyCODONE-acetaminophen (PERCOCET/ROXICET) 5-325 MG tablet, Take 1-2 tablets by mouth every 4 (four) hours as needed (moderate to severe pain (when tolerating fluids))., Disp: 30 tablet, Rfl: 0  No current facility-administered medications for this visit.    Blood pressure 112/80, pulse 74, height 5\' 2"  (1.575 m), weight 193 lb (87.544 kg), last menstrual period 09/12/2015.  Physical Exam: abdomen soft benign Vagina sutures intact, no cuff hematoma or mass felt nio midline or adneal masses or tenderness  Diagnostic Tests:   Pathology: benign  Impression: S/p TVHBSO  Plan: Meds ordered this encounter  Medications  . estradiol (ESTRACE) 2 MG tablet    Sig: Take 1 tablet (2 mg total) by mouth daily.    Dispense:  30 tablet    Refill:  11     Follow up: 4  weeks  Florian Buff, MD

## 2015-10-25 ENCOUNTER — Ambulatory Visit: Payer: Commercial Managed Care - HMO | Admitting: Family Medicine

## 2015-10-26 ENCOUNTER — Telehealth: Payer: Self-pay

## 2015-10-26 ENCOUNTER — Encounter: Payer: Self-pay | Admitting: Family Medicine

## 2015-10-26 NOTE — Telephone Encounter (Signed)
Spoke to patient about her missing her appointment yesterday and she said she had been in the hospital and she would call back and re schedule her appointment.

## 2015-11-01 ENCOUNTER — Telehealth: Payer: Self-pay | Admitting: Family Medicine

## 2015-11-01 NOTE — Telephone Encounter (Signed)
Pt aware.

## 2015-11-01 NOTE — Telephone Encounter (Signed)
We will defer refilling for now without an appointment.   Laroy Apple, MD [facility] 11/01/2015, 3:31 PM

## 2015-11-01 NOTE — Telephone Encounter (Signed)
Not on med list

## 2015-11-04 ENCOUNTER — Ambulatory Visit (INDEPENDENT_AMBULATORY_CARE_PROVIDER_SITE_OTHER): Payer: Commercial Managed Care - HMO | Admitting: Family Medicine

## 2015-11-04 ENCOUNTER — Encounter: Payer: Self-pay | Admitting: Family Medicine

## 2015-11-04 VITALS — BP 121/87 | HR 79 | Temp 98.1°F | Ht 62.0 in | Wt 190.2 lb

## 2015-11-04 DIAGNOSIS — M545 Low back pain, unspecified: Secondary | ICD-10-CM

## 2015-11-04 DIAGNOSIS — F329 Major depressive disorder, single episode, unspecified: Secondary | ICD-10-CM | POA: Diagnosis not present

## 2015-11-04 DIAGNOSIS — F32A Depression, unspecified: Secondary | ICD-10-CM

## 2015-11-04 MED ORDER — PREGABALIN 75 MG PO CAPS: 75.0000 mg | ORAL_CAPSULE | Freq: Two times a day (BID) | ORAL | Status: DC

## 2015-11-04 NOTE — Progress Notes (Signed)
   HPI  Patient presents today here to discuss pain and depression.  Depression She explains that she had suicidal ideation about 3 weeks ago and plans to go buy a gun and shoot herself. She states that she stopped because she talked to her son who stopped her from biting the gun. She denies any suicidal thoughts now and states that she would not do this to herself, she contracts for safety. She agrees to go see a psychiatrist at [facility]- she explains they have group therapy she can go to tomorrow She states she will reach out to her son, 911, or the [facility] hotline if she has suicidal thoughts or plans  Pain Has several pain complaints including bilateral legs, feet, hands, wrists, and low back pain. She's she attributes this to fibromyalgia. She has tried gabapentin but states that it caused her to follow-up. Reminyl also causes her to throw up. She's been treated recently with Percocet and requests a refill on that today.  PMH: Smoking status noted ROS: Per HPI  Objective: BP 121/87 mmHg  Pulse 79  Temp(Src) 98.1 F (36.7 C) (Oral)  Ht 5\' 2"  (1.575 m)  Wt 190 lb 3.2 oz (86.274 kg)  BMI 34.78 kg/m2  LMP 09/12/2015 (Exact Date) Gen: NAD, alert, cooperative with exam HEENT: NCAT, vitiligo CV: RRR, good S1/S2, no murmur Resp: CTABL, no wheezes, non-labored Ext: No edema, warm Neuro: Alert and oriented, No gross deficits  Back: Midline and paraspinal tenderness to palpation of lumbar spinal area  Assessment and plan:  # Depression Severe depression withPHQ-9 score 22 today, she had recent suicidal thoughts but denies them today, she contracts for safety She will [facility]-up [facility] for [facility] therapy and agrees to meet with them for more aggressive psychiatric medication therapy. Consider addition of Abilify if they do not add any other medications But has not had it today as she was very concerned about her pain and wanted to try different pain medication  instead.  # Pain complaints Several pain complaints She does have age-indeterminate compression fractures of the lumbar spine, she's been seen by orthopedics for this he was offered an MRI for further characterization She is failed gabapentin Try Lyrica I have declined her request for narcotic refill  Likely needs prior auth which we will work on.    Meds ordered this encounter  Medications  . pregabalin (LYRICA) 75 MG capsule    Sig: Take 1 capsule (75 mg total) by mouth 2 (two) times daily.    Dispense:  60 capsule    Refill:  [place], MD [facility] 11/04/2015, 3:12 PM

## 2015-11-04 NOTE — Patient Instructions (Signed)
Great to see you  Please go to [facility] as we discussed  Try lyrica 1 pill twice daily, come back in 3-4 weeks to discuss pain

## 2015-11-08 ENCOUNTER — Telehealth: Payer: Self-pay | Admitting: Family Medicine

## 2015-11-08 DIAGNOSIS — M545 Low back pain, unspecified: Secondary | ICD-10-CM

## 2015-11-08 DIAGNOSIS — M25569 Pain in unspecified knee: Secondary | ICD-10-CM

## 2015-11-09 DIAGNOSIS — M25569 Pain in unspecified knee: Secondary | ICD-10-CM | POA: Insufficient documentation

## 2015-11-09 NOTE — Telephone Encounter (Signed)
Referral written, previously she was referred to [facility] for back pain, however she was unable to be reached by telephone.  Please make sure she has a current telephone number listed in the chart

## 2015-11-09 NOTE — Telephone Encounter (Signed)
Pt aware.

## 2015-11-16 ENCOUNTER — Telehealth: Payer: Self-pay | Admitting: *Deleted

## 2015-11-16 ENCOUNTER — Ambulatory Visit (INDEPENDENT_AMBULATORY_CARE_PROVIDER_SITE_OTHER): Payer: Commercial Managed Care - HMO

## 2015-11-16 ENCOUNTER — Ambulatory Visit: Payer: Commercial Managed Care - HMO

## 2015-11-16 ENCOUNTER — Ambulatory Visit (INDEPENDENT_AMBULATORY_CARE_PROVIDER_SITE_OTHER): Payer: Commercial Managed Care - HMO | Admitting: Orthopaedic Surgery

## 2015-11-16 VITALS — BP 128/85 | HR 83 | Temp 98.1°F | Ht 62.0 in | Wt 194.8 lb

## 2015-11-16 DIAGNOSIS — M25561 Pain in right knee: Secondary | ICD-10-CM | POA: Diagnosis not present

## 2015-11-16 DIAGNOSIS — M25531 Pain in right wrist: Secondary | ICD-10-CM | POA: Diagnosis not present

## 2015-11-16 DIAGNOSIS — M25562 Pain in left knee: Secondary | ICD-10-CM | POA: Diagnosis not present

## 2015-11-16 DIAGNOSIS — M25532 Pain in left wrist: Secondary | ICD-10-CM | POA: Diagnosis not present

## 2015-11-16 MED ORDER — HYDROCODONE-ACETAMINOPHEN 5-325 MG PO TABS: 1.0000 | ORAL_TABLET | ORAL | Status: DC | PRN

## 2015-11-16 MED ORDER — NAPROXEN 500 MG PO TABS: 500.0000 mg | ORAL_TABLET | Freq: Two times a day (BID) | ORAL | Status: DC

## 2015-11-16 NOTE — Progress Notes (Addendum)
CC:  Both of my knees hurt and both of my hands go numb at night  Subjective:    Patient ID: Lynn Logan, female    DOB: [birth year], 45 y.o.   MRN: [id number]  Knee Pain  The incident occurred more than 1 week ago. There was no injury mechanism. The pain is present in the left knee and right knee. The pain is at a severity of 5/10. The pain is moderate. The pain has been worsening since onset. Associated symptoms include an inability to bear weight and a loss of motion. Pertinent negatives include no loss of sensation, muscle weakness, numbness or tingling. The symptoms are aggravated by movement and weight bearing. She has tried rest, heat and ice for the symptoms. The treatment provided mild relief.  Wrist Pain  The pain is present in the right wrist and left wrist. The current episode started more than 1 month ago. There has been no history of extremity trauma. The problem occurs daily. The problem has been gradually worsening. The quality of the pain is described as aching and dull. The pain is at a severity of 5/10. The pain is moderate. Associated symptoms include an inability to bear weight. Pertinent negatives include no numbness or tingling.  Wrist pain is worse at night.  She has no trauma.  She has no giving way of the knees.    Review of Systems  Constitutional:       She has no diabetes She has no hypertension She has Asthma She smokes   HENT: Negative for congestion.   Respiratory: Positive for shortness of breath and wheezing.   Cardiovascular: Negative for chest pain.  Endocrine: Positive for cold intolerance.  Musculoskeletal: Positive for myalgias, joint swelling and arthralgias.  Neurological: Negative for tingling and numbness.  Psychiatric/Behavioral: The patient is nervous/anxious.    Past Medical History  Diagnosis Date  . CHF (congestive heart failure) ([place])   . Anxiety   . Depression   . Chronic back pain   . Right lumbar radiculopathy   . Fibromyalgia    . Arthritis     Rheumatoid  . Gout   . Vitiligo     face   Past Surgical History  Procedure Laterality Date  . Tubal ligation    . Cesarean section    . Dilitation & currettage/hystroscopy with novasure ablation N/A 07/07/2015    Procedure: HYSTEROSCOPY, UTERINE CURETTAGE, ENDOMETRIAL  ABLATION Uterine Cavity Length=6.5cm Uterine Cavity Width=4.5cm Power=161 Watts Time=1 minute 19 seconds;  Surgeon: Florian Buff, MD;  Location: [facility];  Service: Gynecology;  Laterality: N/A;  . Polypectomy  07/07/2015    Procedure: POLYPECTOMY;  Surgeon: Florian Buff, MD;  Location: [facility];  Service: Gynecology;;  . Vaginal hysterectomy N/A 10/13/2015    Procedure: HYSTERECTOMY VAGINAL;  Surgeon: Florian Buff, MD;  Location: [facility];  Service: Gynecology;  Laterality: N/A;  . Salpingoophorectomy Bilateral 10/13/2015    Procedure: BILATERAL SALPINGO OOPHORECTOMY;  Surgeon: Florian Buff, MD;  Location: [facility];  Service: Gynecology;  Laterality: Bilateral;      Social History   Social History  . Marital Status: Divorced    Spouse Name: N/A  . Number of Children: N/A  . Years of Education: N/A   Occupational History  . Not on file.   Social History Main Topics  . Smoking status: Current Every Day Smoker -- 1.00 packs/day for 30 years    Types: Cigarettes  . Smokeless tobacco: Former Systems developer  . Alcohol  Use: No  . Drug Use: No     Comment: Hx of marijuana use - None now  . Sexual Activity: Not Currently    Birth Control/ Protection: Surgical   Other Topics Concern  . Not on file   Social History Narrative    Objective:   Physical Exam  Constitutional: She is oriented to person, place, and time. She appears well-developed and well-nourished.  HENT:  Head: Normocephalic and atraumatic.  Eyes: Conjunctivae and EOM are normal. Pupils are equal, round, and reactive to light.  Neck: Normal range of motion. Neck supple.  Cardiovascular: Normal rate, regular rhythm and intact distal pulses.     Pulmonary/Chest: Effort normal.  Abdominal: Soft.  Musculoskeletal: She exhibits tenderness (Both knees are tender with crepitus and slight effusion.  Both wrists have positive Phalen and Tinels.).       Arms:      Legs: Neurological: She is alert and oriented to person, place, and time. She has normal reflexes. She displays normal reflexes. No cranial nerve deficit. She exhibits normal muscle tone. Coordination normal.  Skin: Skin is dry.  Psychiatric: She has a normal mood and affect. Her behavior is normal. Judgment and thought content normal.   I have talked to her about cutting back on smoking.  She will consider.  I have told her about EMG study.  That will be arranged.  She may need MRI of the knee(s).  The patient request injection, verbal consent was obtained.  The right knee was prepped appropriately after time out was performed.   Sterile technique was observed and injection of 1 cc of Depo-Medrol 40 mg with several cc's of plain xylocaine. Anesthesia was provided by ethyl chloride and a 20-gauge needle was used to inject the knee area. The injection was tolerated well.  A band aid dressing was applied.  The patient was advised to apply ice later today and tomorrow to the injection sight as needed.  The patient request injection, verbal consent was obtained.  The left knee was prepped appropriately after time out was performed.   Sterile technique was observed and injection of 1 cc of Depo-Medrol 40 mg with several cc's of plain xylocaine. Anesthesia was provided by ethyl chloride and a 20-gauge needle was used to inject the knee area. The injection was tolerated well.  A band aid dressing was applied.  The patient was advised to apply ice later today and tomorrow to the injection sight as needed.   X-rays of both knees done.  See separate report.     Encounter Diagnoses  Name Primary?  . Right knee pain Yes  . Left knee pain   . Bilateral wrist pain       Assessment & Plan:  Bilateral knee pain, early DJD.  She may need MRI  Bilateral carpal tunnel syndrome.  EMGs ordered.

## 2015-11-16 NOTE — Addendum Note (Signed)
Addended by: Baldomero Lamy B on: 11/16/2015 04:24 PM   Modules accepted: Orders

## 2015-11-16 NOTE — Telephone Encounter (Signed)
REFERRAL FAXED TO DR [facility]

## 2015-11-17 ENCOUNTER — Telehealth: Payer: Self-pay | Admitting: Orthopaedic Surgery

## 2015-11-17 NOTE — Telephone Encounter (Signed)
Raquel Sarna from Dr. Freddie Apley office called and stated that she needs for [name] to get a referral from pt's PCP to Dr. Merlene Laughter.  I told her that I would let the nurse know.

## 2015-11-19 NOTE — Telephone Encounter (Signed)
Routed office note and recommendation to PCP via epic

## 2015-11-22 ENCOUNTER — Ambulatory Visit (INDEPENDENT_AMBULATORY_CARE_PROVIDER_SITE_OTHER): Payer: Commercial Managed Care - HMO | Admitting: Obstetrics & Gynecology

## 2015-11-22 ENCOUNTER — Encounter: Payer: Self-pay | Admitting: Obstetrics & Gynecology

## 2015-11-22 ENCOUNTER — Encounter: Payer: Commercial Managed Care - HMO | Admitting: Obstetrics & Gynecology

## 2015-11-22 VITALS — BP 120/80 | HR 72 | Wt 193.0 lb

## 2015-11-22 DIAGNOSIS — Z9889 Other specified postprocedural states: Secondary | ICD-10-CM

## 2015-11-22 DIAGNOSIS — Z90710 Acquired absence of both cervix and uterus: Secondary | ICD-10-CM

## 2015-11-22 NOTE — Progress Notes (Signed)
Patient ID: Lynn Logan, female   DOB: [birth year], 45 y.o.   MRN: [id number] Chief Complaint  Patient presents with  . Routine Post Op    Blood pressure 120/80, pulse 72, weight 193 lb (87.544 kg), last menstrual period 09/12/2015.  6 weeks post op from [facility] on estrace 2 mg daily  No complaints No bleeding No intercourse  Exam WNL cuff healing well, avoi intercourse for 2 more weeks  Follow up 1 year

## 2015-11-23 ENCOUNTER — Telehealth: Payer: Self-pay | Admitting: Family Medicine

## 2015-11-23 NOTE — Telephone Encounter (Signed)
Spoke to patient and she states that for the last week every time she coughs she wets her clothes and has to change. Pt has apt Friday 3/17 and wants to know if she needs to be seen sooner than apt Friday? Please advise

## 2015-11-23 NOTE — Telephone Encounter (Signed)
Pt aware and states she will call and if gets worse and if not she will wait to be seen Friday.

## 2015-11-23 NOTE — Telephone Encounter (Signed)
This is stress incontinence and probably does not need to be seen any sooner. If she has symptoms of UTI like dysuria, abd pain, fever, then she should be seen sooner. IF she is just worried and needs attention sooner she can always be seen sooner.   Laroy Apple, MD [facility] 11/23/2015, 2:27 PM

## 2015-11-24 NOTE — Telephone Encounter (Signed)
DR Andee Poles AT DR DOONQUAH'S OFFICE, REFERRAL RECEIVED, PENDING SCHEDULING

## 2015-11-24 NOTE — Telephone Encounter (Signed)
PER EMILY AWAITING REFERRAL FROM PCP BEFORE SCHEDULING

## 2015-11-26 ENCOUNTER — Ambulatory Visit (INDEPENDENT_AMBULATORY_CARE_PROVIDER_SITE_OTHER): Payer: Commercial Managed Care - HMO | Admitting: Family Medicine

## 2015-11-26 ENCOUNTER — Encounter: Payer: Self-pay | Admitting: Family Medicine

## 2015-11-26 VITALS — BP 120/84 | HR 74 | Temp 97.5°F | Ht 62.0 in | Wt 194.0 lb

## 2015-11-26 DIAGNOSIS — M797 Fibromyalgia: Secondary | ICD-10-CM | POA: Insufficient documentation

## 2015-11-26 DIAGNOSIS — R300 Dysuria: Secondary | ICD-10-CM

## 2015-11-26 DIAGNOSIS — N393 Stress incontinence (female) (male): Secondary | ICD-10-CM | POA: Diagnosis not present

## 2015-11-26 DIAGNOSIS — E669 Obesity, unspecified: Secondary | ICD-10-CM | POA: Insufficient documentation

## 2015-11-26 LAB — MICROSCOPIC EXAMINATION: RENAL EPITHEL UA: NONE SEEN /HPF

## 2015-11-26 LAB — URINALYSIS, COMPLETE
Bilirubin, UA: NEGATIVE
Glucose, UA: NEGATIVE
KETONES UA: NEGATIVE
NITRITE UA: NEGATIVE
PH UA: 6.5 (ref 5.0–7.5)
Protein, UA: NEGATIVE
SPEC GRAV UA: 1.025 (ref 1.005–1.030)
Urobilinogen, Ur: 0.2 mg/dL (ref 0.2–1.0)

## 2015-11-26 NOTE — Patient Instructions (Signed)
Great to see you!  Try the Kegel exercises, see the handout for more info Try to walk to the store and back 5 days a weeek Try to eat 3 meals a day   Come back in 1 month for recheck of you weight and urinary symptoms

## 2015-11-26 NOTE — Progress Notes (Signed)
   HPI  Patient presents today here for dysuria, urinary leakage, weight.  Dysuria and urinary leakage Patient states that it's ringworm on for about one week She also has back pain, however this is chronic and unchanged No fevers or chills. She has had loss of appetite for about one week. Tolerating foods and fluids normally.  Obesity Trying to exercise more regularly, starting the [employer] sneakers program Only eating one meal a day   Has chronic back pain, knee pain, and has been diagnosed with fibromyalgia by orthopedics As already failed gabapentin, Cymbalta, and Lyrica.  PMH: Smoking status noted ROS: Per HPI  Objective: BP 120/84 mmHg  Pulse 74  Temp(Src) 97.5 F (36.4 C) (Oral)  Ht 5\' 2"  (1.575 m)  Wt 194 lb (87.998 kg)  BMI 35.47 kg/m2  LMP 09/12/2015 (Exact Date) Gen: NAD, alert, cooperative with exam HEENT: NCAT CV: RRR, good S1/S2, no murmur Resp: CTABL, no wheezes, non-labored Abd: No CVA tenderness, mild tenderness to palpation of the suprapubic area, abdomen otherwise soft and nontender Ext: No edema, warm  Assessment and plan:  # Urinary leakage, stress incontinence Discussed Kegel exercises Return to clinic in one month to follow-up to see how its going UA with on;ly trace Leuks- Culture  #obesity Goals fo rdiet and exercise given  # fibromyalgia Deferring Tx with severe mood d/o to psych- [facility] Failed gabapentin and lyrica, avoid narcoticds    Orders Placed This Encounter  Procedures  . Urinalysis, Complete     Laroy Apple, MD [facility] 11/26/2015, 10:09 AM

## 2015-11-27 LAB — URINE CULTURE

## 2015-12-06 NOTE — Addendum Note (Signed)
Addended by: Willette Pa on: 12/06/2015 10:12 PM   Modules accepted: Miquel Dunn

## 2015-12-14 ENCOUNTER — Ambulatory Visit (INDEPENDENT_AMBULATORY_CARE_PROVIDER_SITE_OTHER): Payer: Commercial Managed Care - HMO | Admitting: Orthopaedic Surgery

## 2015-12-14 ENCOUNTER — Encounter: Payer: Self-pay | Admitting: Orthopaedic Surgery

## 2015-12-14 VITALS — BP 130/93 | HR 82 | Temp 98.1°F | Resp 16 | Ht 62.0 in | Wt 189.0 lb

## 2015-12-14 DIAGNOSIS — M25561 Pain in right knee: Secondary | ICD-10-CM | POA: Diagnosis not present

## 2015-12-14 DIAGNOSIS — M25562 Pain in left knee: Secondary | ICD-10-CM

## 2015-12-14 MED ORDER — HYDROCODONE-ACETAMINOPHEN 7.5-325 MG PO TABS: 1.0000 | ORAL_TABLET | ORAL | Status: DC | PRN

## 2015-12-14 NOTE — Progress Notes (Signed)
Patient [id number] Lynn Logan, female DOB:[birth year], 45 y.o. [id number]  Chief Complaint  Patient presents with  . Follow-up    FOLLOW UP BILATERAL KNEES + WRISTS    HPI  Lynn Logan is a 45 y.o. female who has bilateral knee pain.  The right knee is more tender than the left. She has no giving way, no locking, no redness.  She has swelling and popping. She has no trauma.  Both knees hurt.   Her wrists are not hurting today.   Knee Pain  The pain is present in the left knee and right knee. The quality of the pain is described as aching. The pain is at a severity of 3/10. The pain is mild. The pain has been fluctuating since onset. Associated symptoms include a loss of motion. Pertinent negatives include no loss of sensation, muscle weakness, numbness or tingling. The symptoms are aggravated by weight bearing. She has tried acetaminophen, elevation, heat, ice, NSAIDs and rest for the symptoms. The treatment provided moderate relief.    Body mass index is 34.56 kg/(m^2).   Review of Systems  Constitutional: Positive for fatigue.       She has no diabetes She has no hypertension She has Asthma She smokes   HENT: Negative for congestion.   Respiratory: Positive for shortness of breath and wheezing.   Cardiovascular: Negative for chest pain.  Endocrine: Positive for cold intolerance.  Musculoskeletal: Positive for myalgias, joint swelling and arthralgias.  Allergic/Immunologic: Positive for environmental allergies.  Neurological: Negative for tingling and numbness.  Psychiatric/Behavioral: The patient is nervous/anxious.     Past Medical History  Diagnosis Date  . CHF (congestive heart failure) ([place]   . Anxiety   . Depression   . Chronic back pain   . Right lumbar radiculopathy   . Fibromyalgia   . Arthritis     Rheumatoid  . Gout   . Vitiligo     face    Past Surgical History  Procedure Laterality Date  . Tubal ligation    . Cesarean section    . Dilitation  & currettage/hystroscopy with novasure ablation N/A 07/07/2015    Procedure: HYSTEROSCOPY, UTERINE CURETTAGE, ENDOMETRIAL  ABLATION Uterine Cavity Length=6.5cm Uterine Cavity Width=4.5cm Power=161 Watts Time=1 minute 19 seconds;  Surgeon: Florian Buff, MD;  Location: [facility];  Service: Gynecology;  Laterality: N/A;  . Polypectomy  07/07/2015    Procedure: POLYPECTOMY;  Surgeon: Florian Buff, MD;  Location: [facility];  Service: Gynecology;;  . Vaginal hysterectomy N/A 10/13/2015    Procedure: HYSTERECTOMY VAGINAL;  Surgeon: Florian Buff, MD;  Location: [facility];  Service: Gynecology;  Laterality: N/A;  . Salpingoophorectomy Bilateral 10/13/2015    Procedure: BILATERAL SALPINGO OOPHORECTOMY;  Surgeon: Florian Buff, MD;  Location: [facility];  Service: Gynecology;  Laterality: Bilateral;    Family History  Problem Relation Age of Onset  . Diabetes Mother   . Congestive Heart Failure Mother   . Depression Father   . Hypertension Father   . Cancer Father   . Heart disease Maternal Uncle     Social History Social History  Substance Use Topics  . Smoking status: Current Every Day Smoker -- 1.00 packs/day for 30 years    Types: Cigarettes  . Smokeless tobacco: Former Systems developer  . Alcohol Use: No    Allergies  Allergen Reactions  . Toradol [Ketorolac Tromethamine] Itching  . Lyrica [Pregabalin] Nausea And Vomiting    Current Outpatient Prescriptions  Medication Sig Dispense Refill  .  diclofenac sodium (VOLTAREN) 1 % GEL Apply 2 g topically 4 (four) times daily. 100 g 2  . LINZESS 145 MCG CAPS capsule Take 145 mcg by mouth daily.     Marland Kitchen omeprazole (PRILOSEC) 20 MG capsule Take 1 capsule (20 mg total) by mouth daily. 1 tablet a day 30 capsule 6  . HYDROcodone-acetaminophen (NORCO) 7.5-325 MG tablet Take 1 tablet by mouth every 4 (four) hours as needed for moderate pain (Must last 30 days.  Do not drive or operate machinery while taking this medicine.). 120 tablet 0   No current facility-administered  medications for this visit.     Physical Exam  Blood pressure 130/93, pulse 82, temperature 98.1 F (36.7 C), resp. rate 16, height 5\' 2"  (1.575 m), weight 189 lb (85.73 kg), last menstrual period 09/12/2015.  Constitutional: overall normal hygiene, normal nutrition, well developed, normal grooming, normal body habitus. Assistive device:none  Musculoskeletal: gait and station Limp right, muscle tone and strength are normal, no tremors or atrophy is present.  .  Neurological: coordination overall normal.  Deep tendon reflex/nerve stretch intact.  Sensation normal.  Cranial nerves II-XII intact.   Skin:   normal overall no scars, lesions, ulcers or rashes. No psoriasis.  Psychiatric: Alert and oriented x 3.  Recent memory intact, remote memory unclear.  Normal mood and affect. Well groomed.  Good eye contact.  Cardiovascular: overall no swelling, no varicosities, no edema bilaterally, normal temperatures of the legs and arms, no clubbing, cyanosis and good capillary refill.  Lymphatic: palpation is normal.  The bilateral lower extremity is examined:  Inspection:  Thigh:  Non-tender and no defects  Knee has swelling 1+ effusion.Present both knees, the right hurts more.                        Joint tenderness is present                        Patient is tender over the medial joint line  Lower Leg:  Has normal appearance and no tenderness or defects  Ankle:  Non-tender and no defects  Foot:  Non-tender and no defects Range of Motion:  Knee:  Range of motion is: 0-100 right, 0-105 left                        Crepitus is  present  Ankle:  Range of motion is normal. Strength and Tone:  The bilateral lower extremity has normal strength and tone. Stability:  Knee:  The knee is stable.  Ankle:  The ankle is stable.   The patient has been educated about the nature of the problem(s) and counseled on treatment options.  The patient appeared to understand what I have discussed and is  in agreement with it.  Encounter Diagnoses  Name Primary?  . Right knee pain Yes  . Left knee pain     PLAN Call if any problems.  Precautions discussed.  Continue current medications.   Return to clinic 3 months

## 2015-12-20 ENCOUNTER — Other Ambulatory Visit: Payer: Self-pay | Admitting: Family Medicine

## 2015-12-21 NOTE — Telephone Encounter (Signed)
I do not see on patients medication list. Please advise. Rx sent from pharmacy

## 2015-12-27 ENCOUNTER — Ambulatory Visit: Payer: Commercial Managed Care - HMO | Admitting: Family Medicine

## 2015-12-31 ENCOUNTER — Telehealth: Payer: Self-pay | Admitting: Family Medicine

## 2015-12-31 ENCOUNTER — Ambulatory Visit (INDEPENDENT_AMBULATORY_CARE_PROVIDER_SITE_OTHER): Payer: Commercial Managed Care - HMO | Admitting: Family Medicine

## 2015-12-31 ENCOUNTER — Encounter: Payer: Self-pay | Admitting: Family Medicine

## 2015-12-31 ENCOUNTER — Encounter (INDEPENDENT_AMBULATORY_CARE_PROVIDER_SITE_OTHER): Payer: Self-pay

## 2015-12-31 ENCOUNTER — Other Ambulatory Visit: Payer: Self-pay | Admitting: *Deleted

## 2015-12-31 VITALS — BP 125/84 | HR 73 | Temp 97.3°F | Ht 62.0 in | Wt 186.0 lb

## 2015-12-31 DIAGNOSIS — E669 Obesity, unspecified: Secondary | ICD-10-CM

## 2015-12-31 DIAGNOSIS — F329 Major depressive disorder, single episode, unspecified: Secondary | ICD-10-CM

## 2015-12-31 DIAGNOSIS — M797 Fibromyalgia: Secondary | ICD-10-CM

## 2015-12-31 DIAGNOSIS — M25561 Pain in right knee: Secondary | ICD-10-CM

## 2015-12-31 DIAGNOSIS — M545 Low back pain, unspecified: Secondary | ICD-10-CM

## 2015-12-31 DIAGNOSIS — F32A Depression, unspecified: Secondary | ICD-10-CM

## 2015-12-31 DIAGNOSIS — D509 Iron deficiency anemia, unspecified: Secondary | ICD-10-CM | POA: Diagnosis not present

## 2015-12-31 DIAGNOSIS — M25569 Pain in unspecified knee: Secondary | ICD-10-CM

## 2015-12-31 DIAGNOSIS — M25562 Pain in left knee: Secondary | ICD-10-CM

## 2015-12-31 NOTE — Telephone Encounter (Signed)
If she means physical therapy that is perfectly fine,  For back and knee pain.   Laroy Apple, MD [facility] 12/31/2015, 3:59 PM

## 2015-12-31 NOTE — Telephone Encounter (Signed)
Patient aware that referral has been made to physical therapy.

## 2015-12-31 NOTE — Progress Notes (Signed)
   HPI  Patient presents today for follow-up.  Patient explains that she has lots of pain, noting bilateral foot pain, bilateral knee pain, and back pain. She has a long history of back and knee pain. She's been treated by orthopedics with Norco, this is causing nausea. She states she is still taking taking their, however she seems unsure.  She requests diet pills We discussed diet and exercise as means of losing weight.  She states that her anxiety is getting a little bit better with BuSpar, this is been prescribed by her psychiatrist. She denies any suicidal ideation     PMH: Smoking status noted ROS: Per HPI  Objective: BP 125/84 mmHg  Pulse 73  Temp(Src) 97.3 F (36.3 C) (Oral)  Ht '5\' 2"'$  (1.575 m)  Wt 186 lb (84.369 kg)  BMI 34.01 kg/m2  LMP 09/12/2015 (Exact Date) Gen: NAD, alert, cooperative with exam HEENT: NCAT, CV: RRR, good S1/S2, no murmur Resp: CTABL, no wheezes, non-labored Abd: Soft, tenderness to palpation of the epigastric area, no guarding Ext: No edema, warm Neuro: Alert and oriented, No gross deficits  Assessment and plan:  # Musculoskeletal pain Single orthopedics, has prescription for hydrocodone I discussed with her that I don't prefer that she take this long-term  # Anxiety, depression Improving on treatment from psychiatry States she still on Cymbalta Denies suicidal ideation I encouraged her to continue seeing her psychiatrist  # Fibromyalgia Continue Cymbalta She states that she is on Lyrica, however this is on her allergy list and she does not seem very competent about it.  # Obesity Discussed diet and exercise Declined diet pills  Prostatic anemia Postoperative, recheck along with iron Labs    Orders Placed This Encounter  Procedures  . Ferritin  . Iron  . CBC with Differential  . CMP14+EGFR  . Lipid Panel    Meds ordered this encounter  Medications  . naproxen (NAPROSYN) 500 MG tablet    Sig:   . estradiol  (ESTRACE) 2 MG tablet    Sig:   . ibuprofen (ADVIL,MOTRIN) 800 MG tablet    Sig:   . LYRICA 75 MG capsule    Sig:     Laroy Apple, MD [facility] 12/31/2015, 9:43 AM

## 2015-12-31 NOTE — Patient Instructions (Signed)
Great to see you!  Try to start exercising, walking 20 minutes 3-4 times a week is a good start  We will call about labs in less than 1 week

## 2016-01-01 LAB — CMP14+EGFR
ALK PHOS: 106 IU/L (ref 39–117)
ALT: 27 IU/L (ref 0–32)
AST: 21 IU/L (ref 0–40)
Albumin/Globulin Ratio: 1.5 (ref 1.2–2.2)
Albumin: 3.9 g/dL (ref 3.5–5.5)
BILIRUBIN TOTAL: 0.2 mg/dL (ref 0.0–1.2)
BUN/Creatinine Ratio: 12 (ref 9–23)
BUN: 11 mg/dL (ref 6–24)
CHLORIDE: 105 mmol/L (ref 96–106)
CO2: 20 mmol/L (ref 18–29)
CREATININE: 0.89 mg/dL (ref 0.57–1.00)
Calcium: 9.5 mg/dL (ref 8.7–10.2)
GFR calc non Af Amer: 79 mL/min/{1.73_m2} (ref >59)
GFR, EST AFRICAN AMERICAN: 91 mL/min/{1.73_m2} (ref >59)
GLUCOSE: 82 mg/dL (ref 65–99)
Globulin, Total: 2.6 g/dL (ref 1.5–4.5)
Potassium: 4.7 mmol/L (ref 3.5–5.2)
Sodium: 142 mmol/L (ref 134–144)
TOTAL PROTEIN: 6.5 g/dL (ref 6.0–8.5)

## 2016-01-01 LAB — LIPID PANEL
CHOLESTEROL TOTAL: 197 mg/dL (ref 100–199)
Chol/HDL Ratio: 4.1 ratio units (ref 0.0–4.4)
HDL: 48 mg/dL (ref >39)
LDL CALC: 124 mg/dL — AB (ref 0–99)
Triglycerides: 123 mg/dL (ref 0–149)
VLDL CHOLESTEROL CAL: 25 mg/dL (ref 5–40)

## 2016-01-01 LAB — CBC WITH DIFFERENTIAL/PLATELET
BASOS ABS: 0.1 10*3/uL (ref 0.0–0.2)
Basos: 1 %
EOS (ABSOLUTE): 0.1 10*3/uL (ref 0.0–0.4)
Eos: 2 %
HEMOGLOBIN: 12.4 g/dL (ref 11.1–15.9)
Hematocrit: 39.6 % (ref 34.0–46.6)
IMMATURE GRANS (ABS): 0 10*3/uL (ref 0.0–0.1)
Immature Granulocytes: 0 %
LYMPHS: 28 %
Lymphocytes Absolute: 1.8 10*3/uL (ref 0.7–3.1)
MCH: 21.6 pg — AB (ref 26.6–33.0)
MCHC: 31.3 g/dL — ABNORMAL LOW (ref 31.5–35.7)
MCV: 69 fL — ABNORMAL LOW (ref 79–97)
MONOCYTES: 6 %
Monocytes Absolute: 0.4 10*3/uL (ref 0.1–0.9)
NEUTROS ABS: 4 10*3/uL (ref 1.4–7.0)
NEUTROS PCT: 63 %
PLATELETS: 329 10*3/uL (ref 150–379)
RBC: 5.74 x10E6/uL — AB (ref 3.77–5.28)
RDW: 17.6 % — ABNORMAL HIGH (ref 12.3–15.4)
WBC: 6.3 10*3/uL (ref 3.4–10.8)

## 2016-01-01 LAB — FERRITIN: FERRITIN: 9 ng/mL — AB (ref 15–150)

## 2016-01-01 LAB — IRON: IRON: 18 ug/dL — AB (ref 27–159)

## 2016-01-06 ENCOUNTER — Telehealth: Payer: Self-pay | Admitting: Family Medicine

## 2016-01-06 MED ORDER — FERROUS SULFATE 324 (65 FE) MG PO TBEC: 1.0000 | DELAYED_RELEASE_TABLET | Freq: Every day | ORAL | Status: DC

## 2016-01-06 NOTE — Telephone Encounter (Signed)
She needs to be seen.  Laroy Apple, MD [facility] 01/06/2016, 5:18 PM

## 2016-01-06 NOTE — Telephone Encounter (Signed)
Rx sent.  Laroy Apple, MD [facility] 01/06/2016, 3:30 PM

## 2016-01-06 NOTE — Telephone Encounter (Signed)
Patient says her sputum from cough has dark blood in it. Do you want to send in an antibiotic?     (If appointment needed, please send to Triage pool.)

## 2016-01-06 NOTE — Telephone Encounter (Signed)
Aware, iron script sent in.

## 2016-01-06 NOTE — Telephone Encounter (Signed)
Please advise concerning iron prescription.

## 2016-01-07 ENCOUNTER — Ambulatory Visit (INDEPENDENT_AMBULATORY_CARE_PROVIDER_SITE_OTHER): Payer: Commercial Managed Care - HMO | Admitting: Family Medicine

## 2016-01-07 ENCOUNTER — Telehealth: Payer: Self-pay | Admitting: Family Medicine

## 2016-01-07 ENCOUNTER — Other Ambulatory Visit: Payer: Self-pay

## 2016-01-07 ENCOUNTER — Ambulatory Visit (HOSPITAL_COMMUNITY)
Admission: RE | Admit: 2016-01-07 | Discharge: 2016-01-07 | Disposition: A | Discharge status: Home / Self Care | Payer: Commercial Managed Care - HMO | Source: Ambulatory Visit | Attending: Family Medicine | Admitting: Family Medicine

## 2016-01-07 ENCOUNTER — Encounter: Payer: Self-pay | Admitting: Family Medicine

## 2016-01-07 ENCOUNTER — Ambulatory Visit (INDEPENDENT_AMBULATORY_CARE_PROVIDER_SITE_OTHER): Payer: Commercial Managed Care - HMO

## 2016-01-07 VITALS — BP 111/78 | HR 104 | Temp 97.2°F | Ht 63.0 in | Wt 185.4 lb

## 2016-01-07 DIAGNOSIS — R042 Hemoptysis: Secondary | ICD-10-CM

## 2016-01-07 DIAGNOSIS — M7989 Other specified soft tissue disorders: Secondary | ICD-10-CM

## 2016-01-07 DIAGNOSIS — R0600 Dyspnea, unspecified: Secondary | ICD-10-CM

## 2016-01-07 DIAGNOSIS — R600 Localized edema: Secondary | ICD-10-CM

## 2016-01-07 MED ORDER — IOPAMIDOL (ISOVUE-370) INJECTION 76%
100.0000 mL | Freq: Once | INTRAVENOUS | Status: AC | PRN
  Administered 2016-01-07: 100 mL via INTRAVENOUS

## 2016-01-07 NOTE — Telephone Encounter (Signed)
Call to pt Requesting medication for congestion Informed pt we need to get CT report before medication can be rxed per Dr Livia Snellen Pt verbalizes understanding

## 2016-01-07 NOTE — Progress Notes (Signed)
Subjective:  Patient ID: Lynn Logan, female    DOB: 1971/03/12  Age: 45 y.o. MRN: [id number]  CC: URI   HPI Lynn Logan presents for 2 days of increasing dyspnea. She's also had productive cough green sputum tinged with blood. She was a heavy smoker as high as 5 packs a day in the past down to 1 pack a day until recently its climb back up to 3 packs a day. In addition to the cough she was having some chest tightness. She notes that her right thigh and leg have started swelling and have moderately severe pain starting today. There is no fever there are no chills or sweats. No upper respiratory symptoms accompanying this.   History Darely has a past medical history of CHF (congestive heart failure) ([place]); Anxiety; Depression; Chronic back pain; Right lumbar radiculopathy; Fibromyalgia; Arthritis; Gout; and Vitiligo.   She has past surgical history that includes Tubal ligation; Cesarean section; Dilatation & currettage/hysteroscopy with novasure ablation (N/A, 07/07/2015); polypectomy (07/07/2015); Vaginal hysterectomy (N/A, 10/13/2015); and Salpingoophorectomy (Bilateral, 10/13/2015).   Her family history includes Cancer in her father; Congestive Heart Failure in her mother; Depression in her father; Diabetes in her mother; Heart disease in her maternal uncle; Hypertension in her father.She reports that she has been smoking Cigarettes.  She has a 30 pack-year smoking history. She has quit using smokeless tobacco. She reports that she does not drink alcohol or use illicit drugs.    ROS Review of Systems  Constitutional: Negative for fever, activity change and appetite change.  HENT: Negative for congestion, rhinorrhea and sore throat.   Eyes: Negative for visual disturbance.  Respiratory: Positive for cough, chest tightness, shortness of breath and wheezing. Negative for apnea, choking and stridor.   Cardiovascular: Negative for chest pain and palpitations.  Gastrointestinal:  Negative for nausea, abdominal pain and diarrhea.    Objective:  BP 111/78 mmHg  Pulse 104  Temp(Src) 97.2 F (36.2 C) (Oral)  Ht 5\' 3"  (1.6 m)  Wt 185 lb 6.4 oz (84.097 kg)  BMI 32.85 kg/m2  SpO2 97%  LMP 09/12/2015 (Exact Date)  BP Readings from Last 3 Encounters:  01/07/16 111/78  12/31/15 125/84  12/14/15 130/93    Wt Readings from Last 3 Encounters:  01/07/16 185 lb 6.4 oz (84.097 kg)  12/31/15 186 lb (84.369 kg)  12/14/15 189 lb (85.73 kg)     Physical Exam  Constitutional: She is oriented to person, place, and time. She appears well-developed and well-nourished. She appears distressed.  HENT:  Head: Normocephalic and atraumatic.  Right Ear: External ear normal.  Left Ear: External ear normal.  Nose: Nose normal.  Mouth/Throat: Oropharynx is clear and moist.  Eyes: Conjunctivae and EOM are normal. Pupils are equal, round, and reactive to light.  Neck: Normal range of motion. Neck supple. No thyromegaly present.  Cardiovascular: Normal rate, regular rhythm and normal heart sounds.   No murmur heard. Pulmonary/Chest: Effort normal. No respiratory distress. She has wheezes. She has no rales.  Breath sounds distant with decreased expiratory phase..  Abdominal: Soft. Bowel sounds are normal. She exhibits no distension. There is no tenderness.  Musculoskeletal: Normal range of motion.  Lymphadenopathy:    She has no cervical adenopathy.  Neurological: She is alert and oriented to person, place, and time. She has normal reflexes.  Skin: Skin is warm and dry.  Psychiatric: She has a normal mood and affect. Her behavior is normal. Judgment and thought content normal.  Lab Results  Component Value Date   WBC 6.3 12/31/2015   HGB 11.5* 10/14/2015   HCT 39.6 12/31/2015   PLT 329 12/31/2015   GLUCOSE 82 12/31/2015   CHOL 197 12/31/2015   TRIG 123 12/31/2015   HDL 48 12/31/2015   LDLCALC 124* 12/31/2015   ALT 27 12/31/2015   AST 21 12/31/2015   NA 142  12/31/2015   K 4.7 12/31/2015   CL 105 12/31/2015   CREATININE 0.89 12/31/2015   BUN 11 12/31/2015   CO2 20 12/31/2015   HGBA1C 5.5 09/23/2015    No results found.  Assessment & Plan:   Ritaann was seen today for uri.  Diagnoses and all orders for this visit:  Hemoptysis -     DG Chest 2 View; Future -     CT Angio Chest PE W/Cm &/Or Wo Cm; Future  Leg edema, right -     CT Angio Chest PE W/Cm &/Or Wo Cm; Future -     Ultrasound doppler venous legs bilat; Future  Dyspnea -     CT Angio Chest PE W/Cm &/Or Wo Cm; Future    I am having Ms. Bold maintain her omeprazole, LINZESS, diclofenac sodium, HYDROcodone-acetaminophen, DULoxetine, naproxen, estradiol, ibuprofen, LYRICA, ferrous sulfate, acetaminophen-codeine, penicillin v potassium, and busPIRone.  Meds ordered this encounter  Medications  . acetaminophen-codeine (TYLENOL #3) 300-30 MG tablet    Sig: Take 1 tablet by mouth every 6 (six) hours as needed.   . penicillin v potassium (VEETID) 500 MG tablet    Sig: Take 500 mg by mouth 3 (three) times daily.   . busPIRone (BUSPAR) 15 MG tablet    Sig: Take 15 mg by mouth 3 (three) times daily.     Follow-up: Return if symptoms worsen or fail to improve.  Claretta Fraise, M.D.

## 2016-01-07 NOTE — Telephone Encounter (Signed)
Pt given appt today with Dr.Stacks at 2:55. Offered an earlier time but pt declined because she needs time to find a ride.

## 2016-01-08 ENCOUNTER — Telehealth: Payer: Self-pay | Admitting: Family Medicine

## 2016-01-10 ENCOUNTER — Ambulatory Visit: Payer: Commercial Managed Care - HMO | Admitting: Physical Therapy

## 2016-01-12 ENCOUNTER — Telehealth: Payer: Self-pay | Admitting: Orthopaedic Surgery

## 2016-01-12 MED ORDER — HYDROCODONE-ACETAMINOPHEN 7.5-325 MG PO TABS: 1.0000 | ORAL_TABLET | ORAL | Status: DC | PRN

## 2016-01-12 NOTE — Telephone Encounter (Signed)
Rx done. 

## 2016-01-12 NOTE — Telephone Encounter (Signed)
Patient requests refill on medication: HYDROcodone-acetaminophen ([place]) 7.5-325 MG tablet [id number] - patient ph# [phone]

## 2016-01-15 ENCOUNTER — Telehealth: Payer: Self-pay | Admitting: *Deleted

## 2016-01-15 NOTE — Telephone Encounter (Signed)
Continued cough congestion No better per pt after taking Z pac and steriods appt scheduled

## 2016-01-17 ENCOUNTER — Ambulatory Visit: Payer: Commercial Managed Care - HMO | Admitting: Family Medicine

## 2016-01-18 ENCOUNTER — Encounter: Payer: Self-pay | Admitting: Family Medicine

## 2016-01-18 ENCOUNTER — Ambulatory Visit (INDEPENDENT_AMBULATORY_CARE_PROVIDER_SITE_OTHER): Payer: Commercial Managed Care - HMO | Admitting: Family Medicine

## 2016-01-18 VITALS — BP 131/89 | HR 72 | Temp 96.7°F | Ht 63.0 in | Wt 190.0 lb

## 2016-01-18 DIAGNOSIS — R0981 Nasal congestion: Secondary | ICD-10-CM | POA: Diagnosis not present

## 2016-01-18 DIAGNOSIS — J029 Acute pharyngitis, unspecified: Secondary | ICD-10-CM | POA: Diagnosis not present

## 2016-01-18 DIAGNOSIS — R6883 Chills (without fever): Secondary | ICD-10-CM | POA: Diagnosis not present

## 2016-01-18 DIAGNOSIS — J209 Acute bronchitis, unspecified: Secondary | ICD-10-CM

## 2016-01-18 LAB — VERITOR FLU A/B WAIVED
INFLUENZA B: NEGATIVE
Influenza A: NEGATIVE

## 2016-01-18 MED ORDER — AZITHROMYCIN 250 MG PO TABS: ORAL_TABLET | ORAL | Status: DC

## 2016-01-18 MED ORDER — PREDNISONE 20 MG PO TABS: 40.0000 mg | ORAL_TABLET | Freq: Every day | ORAL | Status: DC

## 2016-01-18 NOTE — Progress Notes (Signed)
   HPI  Patient presents today here with cough and shortness of breath.  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 However after our discussion she states that she never improved after her last visit here about 10 days ago.  She was evaluated about 10 days ago with a CAT scan of the chest after complaining of hemoptysis and leg swelling. That was negative for pneumonia, lung malignancy  Since that time she continues to have worsening shortness of breath, cough, and malaise. She explains that she is has a planned catheterization coming up in 2 weeks at [place], on chart review I do not see any of the testing she is mentioning. There does appear to be a GI procedure upcoming  He complains of sharp right-sided chest pain which hurts with movement, cough, deep inspiration, and walking. She has no associated racing heart or radiation.  She is a current smoker, outlined very well in the last note, previously as high as 5 packs a day, then down to one pack a day, now back up to more than one pack a day  He states for the last 2 weeks or so she's had increased shortness of breath with lying down. Within that time. She's had a chest x-ray and CT chest which did not show any pleural effusion  PMH: Smoking status noted ROS: Per HPI  Objective: BP 131/89 mmHg  Pulse 72  Temp(Src) 96.7 F (35.9 C) (Oral)  Ht 5\' 3"  (1.6 m)  Wt 190 lb (86.183 kg)  BMI 33.67 kg/m2  LMP 09/12/2015 (Exact Date) Gen: NAD, alert, cooperative with exam HEENT: NCAT, nares with some swelling bilaterally CV: RRR, good S1/S2, no murmur Chest wall: Tenderness to palpation of right-sided sternocostal joints, reproduces pain Resp: CTABL, no wheezes, non-labored Ext: No edema, warm Neuro: Alert and oriented, No gross deficits  Assessment and plan:  # Acute bronchitis Treat with azithromycin,  prednisone  # Costochondritis supportive care  She has a recent refill pain medication from her orthopedic surgeon.  Patient re-affirms she has had a stress test and echo, her chest pain today is reproducible on exam so is consistent with costochondritis    Orders Placed This Encounter  Procedures  . Veritor Flu A/B Waived    Order Specific Question:  Source    Answer:  nose  . Rapid strep screen (not at [facility])    Meds ordered this encounter  Medications  . azithromycin (ZITHROMAX) 250 MG tablet    Sig: Take 2 tablets on day 1 and 1 tablet daily after that    Dispense:  6 tablet    Refill:  0  . predniSONE (DELTASONE) 20 MG tablet    Sig: Take 2 tablets (40 mg total) by mouth daily with breakfast.    Dispense:  10 tablet    Refill:  0    Laroy Apple, MD [facility] 01/18/2016, 8:38 AM

## 2016-01-18 NOTE — Patient Instructions (Addendum)
Great to see you!  I am very sorry you are sick, please come back if you are not getting better in a few days or if you are getting worse.   Acute Bronchitis Bronchitis is when the airways that extend from the windpipe into the lungs get red, puffy, and painful (inflamed). Bronchitis often causes thick spit (mucus) to develop. This leads to a cough. A cough is the most common symptom of bronchitis. In acute bronchitis, the condition usually begins suddenly and goes away over time (usually in 2 weeks). Smoking, allergies, and asthma can make bronchitis worse. Repeated episodes of bronchitis may cause more lung problems. HOME CARE  Rest.  Drink enough fluids to keep your pee (urine) clear or pale yellow (unless you need to limit fluids as told by your doctor).  Only take over-the-counter or prescription medicines as told by your doctor.  Avoid smoking and secondhand smoke. These can make bronchitis worse. If you are a smoker, think about using nicotine gum or skin patches. Quitting smoking will help your lungs heal faster.  Reduce the chance of getting bronchitis again by:  Washing your hands often.  Avoiding people with cold symptoms.  Trying not to touch your hands to your mouth, nose, or eyes.  Follow up with your doctor as told. GET HELP IF: Your symptoms do not improve after 1 week of treatment. Symptoms include:  Cough.  Fever.  Coughing up thick spit.  Body aches.  Chest congestion.  Chills.  Shortness of breath.  Sore throat. GET HELP RIGHT AWAY IF:   You have an increased fever.  You have chills.  You have severe shortness of breath.  You have bloody thick spit (sputum).  You throw up (vomit) often.  You lose too much body fluid (dehydration).  You have a severe headache.  You faint. MAKE SURE YOU:   Understand these instructions.  Will watch your condition.  Will get help right away if you are not doing well or get worse.   This information  is not intended to replace advice given to you by your health care provider. Make sure you discuss any questions you have with your health care provider.   Document Released: 02/14/2008 Document Revised: 04/30/2013 Document Reviewed: 02/18/2013 Elsevier Interactive Patient Education [employer].

## 2016-01-20 LAB — CULTURE, GROUP A STREP: STREP A CULTURE: NEGATIVE

## 2016-01-22 LAB — CULTURE, GROUP A STREP

## 2016-01-22 LAB — RAPID STREP SCREEN (MED CTR MEBANE ONLY): Strep Gp A Ag, IA W/Reflex: NEGATIVE

## 2016-01-24 ENCOUNTER — Telehealth: Payer: Self-pay | Admitting: Family Medicine

## 2016-01-24 DIAGNOSIS — M1710 Unilateral primary osteoarthritis, unspecified knee: Secondary | ICD-10-CM

## 2016-01-24 NOTE — Telephone Encounter (Signed)
Handwritten Rx written for bilateral knee sleeves.  Prescription placed upfront for patient to pick up.  Laroy Apple, MD [facility] 01/24/2016, 6:29 PM

## 2016-01-24 NOTE — Telephone Encounter (Signed)
Patient is requesting knee brace for both knees for arthritis.

## 2016-01-25 ENCOUNTER — Ambulatory Visit (INDEPENDENT_AMBULATORY_CARE_PROVIDER_SITE_OTHER): Payer: Commercial Managed Care - HMO | Admitting: Orthopaedic Surgery

## 2016-01-25 ENCOUNTER — Encounter: Payer: Self-pay | Admitting: Family Medicine

## 2016-01-25 ENCOUNTER — Encounter: Payer: Self-pay | Admitting: Orthopaedic Surgery

## 2016-01-25 ENCOUNTER — Telehealth: Payer: Self-pay | Admitting: Family Medicine

## 2016-01-25 VITALS — BP 153/96 | HR 77 | Temp 98.2°F | Ht 63.0 in | Wt 185.0 lb

## 2016-01-25 DIAGNOSIS — M25531 Pain in right wrist: Secondary | ICD-10-CM

## 2016-01-25 DIAGNOSIS — M25562 Pain in left knee: Secondary | ICD-10-CM

## 2016-01-25 DIAGNOSIS — M25561 Pain in right knee: Secondary | ICD-10-CM | POA: Diagnosis not present

## 2016-01-25 DIAGNOSIS — M25532 Pain in left wrist: Secondary | ICD-10-CM

## 2016-01-25 NOTE — Progress Notes (Signed)
CC: Both of my knees are hurting. I would like an injection in both knees.  The patient has had chronic pain and tenderness of both knees for some time.  Injections help.  There is no locking or giving way of the knee.  There is no new trauma. There is no redness or signs of infections.  The knees have a mild effusion and some crepitus.  There is no redness or signs of recent trauma.  Impression:  Chronic pain of the both knees  Return:  One month  PROCEDURE NOTE:  The patient requests injections of the left knee , verbal consent was obtained.  The left knee was prepped appropriately after time out was performed.   Sterile technique was observed and injection of 1 cc of Depo-Medrol 40 mg with several cc's of plain xylocaine. Anesthesia was provided by ethyl chloride and a 20-gauge needle was used to inject the knee area. The injection was tolerated well.  A band aid dressing was applied.  The patient was advised to apply ice later today and tomorrow to the injection sight as needed.  PROCEDURE NOTE:  The patient requests injections of the right knee , verbal consent was obtained.  The right knee was prepped appropriately after time out was performed.   Sterile technique was observed and injection of 1 cc of Depo-Medrol 40 mg with several cc's of plain xylocaine. Anesthesia was provided by ethyl chloride and a 20-gauge needle was used to inject the knee area. The injection was tolerated well.  A band aid dressing was applied.  The patient was advised to apply ice later today and tomorrow to the injection sight as needed.   

## 2016-01-25 NOTE — Telephone Encounter (Signed)
Patient aware that Rx is ready for pick up here at the office.

## 2016-02-02 ENCOUNTER — Ambulatory Visit (INDEPENDENT_AMBULATORY_CARE_PROVIDER_SITE_OTHER): Payer: Commercial Managed Care - HMO | Admitting: Family Medicine

## 2016-02-02 ENCOUNTER — Encounter: Payer: Self-pay | Admitting: Family Medicine

## 2016-02-02 VITALS — BP 109/73 | HR 91 | Temp 97.5°F | Ht 63.0 in | Wt 182.2 lb

## 2016-02-02 DIAGNOSIS — R300 Dysuria: Secondary | ICD-10-CM

## 2016-02-02 DIAGNOSIS — N898 Other specified noninflammatory disorders of vagina: Secondary | ICD-10-CM

## 2016-02-02 DIAGNOSIS — R05 Cough: Secondary | ICD-10-CM

## 2016-02-02 DIAGNOSIS — R059 Cough, unspecified: Secondary | ICD-10-CM

## 2016-02-02 LAB — URINALYSIS, COMPLETE
BILIRUBIN UA: NEGATIVE
GLUCOSE, UA: NEGATIVE
KETONES UA: NEGATIVE
Leukocytes, UA: NEGATIVE
NITRITE UA: NEGATIVE
Protein, UA: NEGATIVE
UUROB: 0.2 mg/dL (ref 0.2–1.0)
pH, UA: 6 (ref 5.0–7.5)

## 2016-02-02 LAB — WET PREP FOR TRICH, YEAST, CLUE
CLUE CELL EXAM: NEGATIVE
Trichomonas Exam: NEGATIVE
YEAST EXAM: NEGATIVE

## 2016-02-02 LAB — MICROSCOPIC EXAMINATION
Bacteria, UA: NONE SEEN
WBC, UA: NONE SEEN /hpf (ref 0–?)

## 2016-02-02 MED ORDER — ALBUTEROL SULFATE HFA 108 (90 BASE) MCG/ACT IN AERS: 2.0000 | INHALATION_SPRAY | Freq: Four times a day (QID) | RESPIRATORY_TRACT | Status: DC | PRN

## 2016-02-02 MED ORDER — FLUCONAZOLE 150 MG PO TABS
150.0000 mg | ORAL_TABLET | Freq: Once | ORAL | Status: DC
Start: 2016-02-02 — End: 2016-02-15

## 2016-02-02 NOTE — Progress Notes (Signed)
   HPI  Patient presents today here for dysuria and cough with chest tightness.  Dysuria Patient's when she's had dysuria and urinary frequency for 3 or 4 days. She denies any fever, chills, sweats.  She has continued chest tightness and cough with mild shortness of breath since our last visit, she had transient improvement with prednisone and azithromycin She is still smoking.  He states that she has coronary artery disease and has recently been told by cardiology, about 2 or 3 weeks ago, that she needs 3 stents and mother will place these next month.  Her chest tightness is described as central chest tightness that comes on at rest or with activity and continues for about half an hour, he does not really have any aggravating or alleviating factors, and often ends with radiation down to her bilateral legs.  Describes vaginal discharge that is thick and yellow, as well as small red dots in her perennial area  PMH: Smoking status noted ROS: Per HPI  Objective: BP 109/73 mmHg  Pulse 91  Temp(Src) 97.5 F (36.4 C) (Oral)  Ht 5\' 3"  (1.6 m)  Wt 182 lb 3.2 oz (82.645 kg)  BMI 32.28 kg/m2  LMP 09/12/2015 (Exact Date) Gen: NAD, alert, cooperative with exam HEENT: NCAT CV: RRR, good S1/S2, no murmur Resp: CTABL, no wheezes, non-labored Ext: No edema, warm Neuro: Alert and oriented  Assessment and plan:  # Vaginal discharge, dysuria Concern for external urethritis from possible intertrigo given her described erythematous bumps Diflucan given Discussed that we would need to do pelvic exam if the symptoms do not improve Wet prep is negative today Urinalysis is also negative for UTI  # Cough Concern for underlying developing COPD versus asthma Transient improvement with prednisone and azithromycin Recommended use of inhalers, she states that she's been given 3 via the ER recently Prescribed albuterol Encouraged to quit smoking, filled out the quit line paperwork to get her  nicotine patches   Given her story about ordinary artery disease and CHF I'm very concerned about her chest tightness, however her symptoms are very atypical for cardiac chest pain given the duration of symptoms along with radiation to the legs. This would more likely anxiety. Last visit her chest pain is reproducible with palpation of the sternal border, did not recheck that today. I have requested records from [facility] hospital to look at her echocardiogram as well as her recent cardiology notes.  Very low threshold for emergency room evaluation, she states that she had a cath Last month  and has follow-up with cardiology already arranged in the other system.  Orders Placed This Encounter  Procedures  . Urinalysis, Complete    Meds ordered this encounter  Medications  . fluconazole (DIFLUCAN) 150 MG tablet    Sig: Take 1 tablet (150 mg total) by mouth once. Repeat in 3 days    Dispense:  2 tablet    Refill:  0    Laroy Apple, MD [facility] 02/02/2016, 2:11 PM

## 2016-02-02 NOTE — Patient Instructions (Addendum)
Great to see you!  I have sent diflucan to see if it will help your vaginal symptoms, We will follow up about the swab  Be sure to follow through with the cardiology evaluations you have discussed

## 2016-02-03 ENCOUNTER — Telehealth: Payer: Self-pay | Admitting: Family Medicine

## 2016-02-03 MED ORDER — TRAMADOL HCL 50 MG PO TABS: 50.0000 mg | ORAL_TABLET | Freq: Four times a day (QID) | ORAL | Status: DC | PRN

## 2016-02-03 NOTE — Addendum Note (Signed)
Addended by: Marylin Crosby on: 02/03/2016 12:54 PM   Modules accepted: Orders

## 2016-02-03 NOTE — Telephone Encounter (Addendum)
Spoke with pt and she would like to try the Tramadol and wants it called into [facility] in [place].  Rx called into the pharmacy and pt is aware.

## 2016-02-03 NOTE — Telephone Encounter (Signed)
Would recommend follow-up with her orthopedic surgeon, I usually do not prescribe anything stronger than tramadol for chronic back pain, we can also arrange for pain management referral for her.  If she would like a prescription for tramadol I am happy to write a prescription for it.    Laroy Apple, MD [facility] 02/03/2016, 11:54 AM

## 2016-02-03 NOTE — Telephone Encounter (Signed)
Patient called stating that she would like to have something for back pain.  Patient was given [place] on 5/3 by Dr. Luna Glasgow and was suppose to last her 30 days.  Patient states tat she was taking 2 tablet 3-4 times daily so she is now out of this medication.

## 2016-02-04 ENCOUNTER — Telehealth: Payer: Self-pay | Admitting: Family Medicine

## 2016-02-04 NOTE — Telephone Encounter (Signed)
Patient called stating that she needs a sleep study and stress test done. Patient states that she quits breathing at night and has COPD

## 2016-02-08 ENCOUNTER — Telehealth: Payer: Self-pay

## 2016-02-08 DIAGNOSIS — R059 Cough, unspecified: Secondary | ICD-10-CM

## 2016-02-08 DIAGNOSIS — R05 Cough: Secondary | ICD-10-CM

## 2016-02-08 MED ORDER — ALBUTEROL SULFATE HFA 108 (90 BASE) MCG/ACT IN AERS: 2.0000 | INHALATION_SPRAY | Freq: Four times a day (QID) | RESPIRATORY_TRACT | Status: DC | PRN

## 2016-02-08 MED ORDER — HYDROCODONE-ACETAMINOPHEN 7.5-325 MG PO TABS: 1.0000 | ORAL_TABLET | ORAL | Status: DC | PRN

## 2016-02-08 NOTE — Telephone Encounter (Signed)
Pt called requesting Hydrocodone Refill

## 2016-02-08 NOTE — Telephone Encounter (Signed)
Getting a prior authorization for Proventil HFA  Ventolin HFA is preferred  Can you switch?

## 2016-02-08 NOTE — Telephone Encounter (Signed)
Albuterol HFA is not brand specific, re-sent with no to the pharmacy to the pharmacy that ventolin is okay  Laroy Apple, MD [facility] 02/08/2016, 12:08 PM

## 2016-02-08 NOTE — Telephone Encounter (Signed)
Pt given appt with Dr.Bradshaw June 6th at 8:25.

## 2016-02-08 NOTE — Telephone Encounter (Signed)
Lets ask her to follow up some tim ethis week.   I would be glad to do an ekg and discuss cardiology referral. She has been telling me she has had a recent cath and needs stents, so this is  aconfusing situation.   Laroy Apple, MD [facility] 02/08/2016, 7:34 AM

## 2016-02-08 NOTE — Telephone Encounter (Signed)
Rx done. 

## 2016-02-10 ENCOUNTER — Other Ambulatory Visit: Payer: Self-pay | Admitting: Family Medicine

## 2016-02-10 ENCOUNTER — Telehealth: Payer: Self-pay | Admitting: Family Medicine

## 2016-02-10 DIAGNOSIS — Z0000 Encounter for general adult medical examination without abnormal findings: Secondary | ICD-10-CM

## 2016-02-10 NOTE — Telephone Encounter (Signed)
Please address

## 2016-02-10 NOTE — Telephone Encounter (Signed)
Order placed for mammo.   Laroy Apple, MD [facility] 02/10/2016, 4:06 PM

## 2016-02-11 ENCOUNTER — Telehealth: Payer: Self-pay | Admitting: Family Medicine

## 2016-02-11 MED ORDER — PREGABALIN 75 MG PO CAPS: 75.0000 mg | ORAL_CAPSULE | Freq: Two times a day (BID) | ORAL | Status: DC

## 2016-02-11 NOTE — Addendum Note (Signed)
Addended by: Michaela Corner on: 02/11/2016 04:29 PM   Modules accepted: Orders

## 2016-02-11 NOTE — Telephone Encounter (Signed)
Spoke with patient she does take the Lyrica.  I am sending the script to [facility]

## 2016-02-11 NOTE — Telephone Encounter (Signed)
Patient informed that order for mammogram has been placed by Dr. Wendi Snipes and she will be contacted with appointment information

## 2016-02-11 NOTE — Telephone Encounter (Signed)
Last filled 01/14/16, last seen 12/31/15. Route to pool, call in at [facility]

## 2016-02-15 ENCOUNTER — Other Ambulatory Visit: Payer: Self-pay | Admitting: Family Medicine

## 2016-02-15 ENCOUNTER — Ambulatory Visit (INDEPENDENT_AMBULATORY_CARE_PROVIDER_SITE_OTHER): Payer: Commercial Managed Care - HMO | Admitting: Family Medicine

## 2016-02-15 ENCOUNTER — Encounter: Payer: Self-pay | Admitting: Family Medicine

## 2016-02-15 VITALS — BP 109/81 | HR 62 | Temp 97.0°F | Ht 63.0 in | Wt 185.2 lb

## 2016-02-15 DIAGNOSIS — R0683 Snoring: Secondary | ICD-10-CM

## 2016-02-15 DIAGNOSIS — R0789 Other chest pain: Secondary | ICD-10-CM | POA: Diagnosis not present

## 2016-02-15 DIAGNOSIS — N644 Mastodynia: Secondary | ICD-10-CM

## 2016-02-15 NOTE — Patient Instructions (Signed)
Great to see you!  I have ordered a cardiology referral and a mammogram  You also have a sleep referral pending for [place].   If you have worsening chest pain like below please seek emergency medical care  Start a daily aspirin  Nonspecific Chest Pain  Chest pain can be caused by many different conditions. There is always a chance that your pain could be related to something serious, such as a heart attack or a blood clot in your lungs. Chest pain can also be caused by conditions that are not life-threatening. If you have chest pain, it is very important to follow up with your health care provider. CAUSES  Chest pain can be caused by:  Heartburn.  Pneumonia or bronchitis.  Anxiety or stress.  Inflammation around your heart (pericarditis) or lung (pleuritis or pleurisy).  A blood clot in your lung.  A collapsed lung (pneumothorax). It can develop suddenly on its own (spontaneous pneumothorax) or from trauma to the chest.  Shingles infection (varicella-zoster virus).  Heart attack.  Damage to the bones, muscles, and cartilage that make up your chest wall. This can include:  Bruised bones due to injury.  Strained muscles or cartilage due to frequent or repeated coughing or overwork.  Fracture to one or more ribs.  Sore cartilage due to inflammation (costochondritis). RISK FACTORS  Risk factors for chest pain may include:  Activities that increase your risk for trauma or injury to your chest.  Respiratory infections or conditions that cause frequent coughing.  Medical conditions or overeating that can cause heartburn.  Heart disease or family history of heart disease.  Conditions or health behaviors that increase your risk of developing a blood clot.  Having had chicken pox (varicella zoster). SIGNS AND SYMPTOMS Chest pain can feel like:  Burning or tingling on the surface of your chest or deep in your chest.  Crushing, pressure, aching, or squeezing  pain.  Dull or sharp pain that is worse when you move, cough, or take a deep breath.  Pain that is also felt in your back, neck, shoulder, or arm, or pain that spreads to any of these areas. Your chest pain may come and go, or it may stay constant. DIAGNOSIS Lab tests or other studies may be needed to find the cause of your pain. Your health care provider may have you take a test called an ambulatory ECG (electrocardiogram). An ECG records your heartbeat patterns at the time the test is performed. You may also have other tests, such as:  Transthoracic echocardiogram (TTE). During echocardiography, sound waves are used to create a picture of all of the heart structures and to look at how blood flows through your heart.  Transesophageal echocardiogram (TEE).This is a more advanced imaging test that obtains images from inside your body. It allows your health care provider to see your heart in finer detail.  Cardiac monitoring. This allows your health care provider to monitor your heart rate and rhythm in real time.  Holter monitor. This is a portable device that records your heartbeat and can help to diagnose abnormal heartbeats. It allows your health care provider to track your heart activity for several days, if needed.  Stress tests. These can be done through exercise or by taking medicine that makes your heart beat more quickly.  Blood tests.  Imaging tests. TREATMENT  Your treatment depends on what is causing your chest pain. Treatment may include:  Medicines. These may include:  Acid blockers for heartburn.  Anti-inflammatory medicine.  Pain medicine for inflammatory conditions.  Antibiotic medicine, if an infection is present.  Medicines to dissolve blood clots.  Medicines to treat coronary artery disease.  Supportive care for conditions that do not require medicines. This may include:  Resting.  Applying heat or cold packs to injured areas.  Limiting activities  until pain decreases. HOME CARE INSTRUCTIONS  If you were prescribed an antibiotic medicine, finish it all even if you start to feel better.  Avoid any activities that bring on chest pain.  Do not use any tobacco products, including cigarettes, chewing tobacco, or electronic cigarettes. If you need help quitting, ask your health care provider.  Do not drink alcohol.  Take medicines only as directed by your health care provider.  Keep all follow-up visits as directed by your health care provider. This is important. This includes any further testing if your chest pain does not go away.  If heartburn is the cause for your chest pain, you may be told to keep your head raised (elevated) while sleeping. This reduces the chance that acid will go from your stomach into your esophagus.  Make lifestyle changes as directed by your health care provider. These may include:  Getting regular exercise. Ask your health care provider to suggest some activities that are safe for you.  Eating a heart-healthy diet. A registered dietitian can help you to learn healthy eating options.  Maintaining a healthy weight.  Managing diabetes, if necessary.  Reducing stress. SEEK MEDICAL CARE IF:  Your chest pain does not go away after treatment.  You have a rash with blisters on your chest.  You have a fever. SEEK IMMEDIATE MEDICAL CARE IF:   Your chest pain is worse.  You have an increasing cough, or you cough up blood.  You have severe abdominal pain.  You have severe weakness.  You faint.  You have chills.  You have sudden, unexplained chest discomfort.  You have sudden, unexplained discomfort in your arms, back, neck, or jaw.  You have shortness of breath at any time.  You suddenly start to sweat, or your skin gets clammy.  You feel nauseous or you vomit.  You suddenly feel light-headed or dizzy.  Your heart begins to beat quickly, or it feels like it is skipping beats. These  symptoms may represent a serious problem that is an emergency. Do not wait to see if the symptoms will go away. Get medical help right away. Call your local emergency services (911 in the [place] Do not drive yourself to the hospital.   This information is not intended to replace advice given to you by your health care provider. Make sure you discuss any questions you have with your health care provider.   Document Released: 06/07/2005 Document Revised: 09/18/2014 Document Reviewed: 04/03/2014 Elsevier Interactive Patient Education [employer].

## 2016-02-15 NOTE — Progress Notes (Signed)
   HPI  Patient presents today here with several complaints.  Dyspnea Patient explains over last 2 weeks she's had exertional dyspnea and orthopnea She denies any fever, chills, sweats. She has history of premature heart disease in the family Her father had heart disease around age 46, her mother recently died at the age of 74 from a massive MI. She also has left-sided burning breast/chest pain. It does not get worse with exertion, is nonradiating. It is worse with palpation of the breasts on my exam-see below  Snoring States she's been snoring severely and having episodes of apnea reported by her fianc for quite a long time Would like a sleep study.  Lice Has had some scalp itching after lice treatment recently, also a few bumps  PMH: Smoking status noted ROS: Per HPI  Objective: BP 109/81 mmHg  Pulse 62  Temp(Src) 97 F (36.1 C) (Oral)  Ht 5\' 3"  (1.6 m)  Wt 185 lb 3.2 oz (84.006 kg)  BMI 32.81 kg/m2  LMP 09/12/2015 (Exact Date) Gen: NAD, alert, cooperative with exam HEENT: NCAT CV: RRR, good S1/S2, no murmur Resp: CTABL, no wheezes, non-labored Ext: No edema, warm Neuro: Alert and oriented, No gross deficits  Breast My CMA< Thomasenia Sales was present for entire exam Left breast normal appearance, no skin changes or nipple inversion No lumps or bumps that are concerning She does have tenderness to palpation and reproduction of pain with palpation of the left upper breast from 10:00 to 2:00  Skin Excoriation on occipital scalp  EKG rate 57, sinus bradycardia, flipped T waves in V1, V3- no acute findings  Assessment and plan:  # Dyspnea, chest pain EKG today Considering family history I would like her to see a cardiologist very soon Atypical chest pain, her breast exam reproduces chest pain, however given flipped T waves on EKG as well as strong family history I would like her to be evaluated by cardiology as soon as possible. She has a confusing past  medical history because she goes to several systems including [facility], [facility] and [name].    # Snoring Referring for sleep study, also has periods of apnea.  # Breast pain This is more likely the pain generator Referring for diagnostic mammogram Exam is reassuring Return to clinic as needed, I've asked her to come back in 2-3 weeks   I explained that one of the overarching difficulties taking care of her are the multiple doctors that she is seeing in different systems. Over the last 2 visits I had concerns about her dyspnea however she has told me that she reported seeing cardiology another system, I have requested records from [facility] who only has an EKG on file, however she states that she's been given nitroglycerin at [facility] through care everywhere which does not have any recent testing she was describing    Laroy Apple, MD [place] 02/15/2016, 8:35 AM

## 2016-02-15 NOTE — Addendum Note (Signed)
Addended by: Timmothy Euler on: 02/15/2016 02:09 PM   Modules accepted: Orders

## 2016-02-16 ENCOUNTER — Encounter: Payer: Self-pay | Admitting: Cardiovascular Disease

## 2016-02-18 ENCOUNTER — Other Ambulatory Visit: Payer: Self-pay | Admitting: Family Medicine

## 2016-02-18 ENCOUNTER — Other Ambulatory Visit: Payer: Self-pay | Admitting: *Deleted

## 2016-02-18 DIAGNOSIS — R05 Cough: Secondary | ICD-10-CM

## 2016-02-18 DIAGNOSIS — R059 Cough, unspecified: Secondary | ICD-10-CM

## 2016-02-18 DIAGNOSIS — J45909 Unspecified asthma, uncomplicated: Secondary | ICD-10-CM

## 2016-02-18 MED ORDER — OMEPRAZOLE 20 MG PO CPDR: 20.0000 mg | DELAYED_RELEASE_CAPSULE | Freq: Every day | ORAL | Status: DC

## 2016-02-18 MED ORDER — ALBUTEROL SULFATE HFA 108 (90 BASE) MCG/ACT IN AERS: 2.0000 | INHALATION_SPRAY | Freq: Four times a day (QID) | RESPIRATORY_TRACT | Status: DC | PRN

## 2016-02-18 MED ORDER — PREGABALIN 75 MG PO CAPS: 75.0000 mg | ORAL_CAPSULE | Freq: Two times a day (BID) | ORAL | Status: DC

## 2016-02-18 NOTE — Telephone Encounter (Signed)
Patient aware and made apt 6/26 to get refill on tramadol. Patient also wanted to know if you could send a rx for nebulizer machine to the pharmacy? Please advise

## 2016-02-18 NOTE — Telephone Encounter (Signed)
Last seen 02/15/16 last filled 02/03/16

## 2016-02-21 ENCOUNTER — Other Ambulatory Visit: Payer: Self-pay | Admitting: Family Medicine

## 2016-02-21 DIAGNOSIS — J45909 Unspecified asthma, uncomplicated: Secondary | ICD-10-CM | POA: Insufficient documentation

## 2016-02-21 MED ORDER — ALBUTEROL SULFATE HFA 108 (90 BASE) MCG/ACT IN AERS: 2.0000 | INHALATION_SPRAY | Freq: Four times a day (QID) | RESPIRATORY_TRACT | Status: DC | PRN

## 2016-02-21 MED ORDER — ALBUTEROL SULFATE (2.5 MG/3ML) 0.083% IN NEBU: 2.5000 mg | INHALATION_SOLUTION | Freq: Four times a day (QID) | RESPIRATORY_TRACT | Status: DC | PRN

## 2016-02-21 NOTE — Telephone Encounter (Signed)
Patient called stating she needed albuterol for her neb and not inhaler. Inhaler was sent to [facility]. Please advise.

## 2016-02-21 NOTE — Telephone Encounter (Signed)
Patient aware. Rx placed up front. 

## 2016-02-21 NOTE — Addendum Note (Signed)
Addended by: Timmothy Euler on: 02/21/2016 07:50 AM   Modules accepted: Orders

## 2016-02-21 NOTE — Telephone Encounter (Signed)
Rx sent  Laroy Apple, MD [facility] 02/21/2016, 5:40 PM

## 2016-02-21 NOTE — Telephone Encounter (Signed)
Order written, we will need to pick up hand written Rx.  Laroy Apple, MD [facility] 02/21/2016, 7:49 AM

## 2016-02-21 NOTE — Telephone Encounter (Signed)
Refill sent to [facility] 

## 2016-02-22 ENCOUNTER — Telehealth: Payer: Self-pay | Admitting: Family Medicine

## 2016-02-22 NOTE — Telephone Encounter (Signed)
Patient aware.

## 2016-02-23 ENCOUNTER — Telehealth: Payer: Self-pay

## 2016-02-23 ENCOUNTER — Encounter: Payer: Self-pay | Admitting: Orthopaedic Surgery

## 2016-02-23 ENCOUNTER — Ambulatory Visit (INDEPENDENT_AMBULATORY_CARE_PROVIDER_SITE_OTHER): Payer: Commercial Managed Care - HMO | Admitting: Orthopaedic Surgery

## 2016-02-23 ENCOUNTER — Other Ambulatory Visit: Payer: Self-pay

## 2016-02-23 VITALS — BP 118/86 | HR 73 | Temp 97.9°F | Ht 63.0 in | Wt 182.0 lb

## 2016-02-23 DIAGNOSIS — M25561 Pain in right knee: Secondary | ICD-10-CM | POA: Diagnosis not present

## 2016-02-23 DIAGNOSIS — N644 Mastodynia: Secondary | ICD-10-CM

## 2016-02-23 DIAGNOSIS — G6289 Other specified polyneuropathies: Secondary | ICD-10-CM

## 2016-02-23 DIAGNOSIS — M25562 Pain in left knee: Secondary | ICD-10-CM

## 2016-02-23 NOTE — Telephone Encounter (Signed)
They need you to put an order for for TOMO mammogram left.  Too early to do BILAT mammogram  So they need to do only side that is having pain

## 2016-02-23 NOTE — Progress Notes (Signed)
Cardiology Office Note    Date:  02/24/2016   ID:  Lynn Logan, DOB July 29, 1971, MRN [id number]  PCP:  Kenn File, MD  Cardiologist:   Jenkins Rouge, MD   No chief complaint on file.   History of Present Illness:  Lynn Logan is a 45 y.o. female referred for evaluation of chest pain She is on disability for fibromyalgia She smokes 2-3 ppd.  She describes being admitted to [facility] last year for CHF. She is sedentary with poor Diet. Just started on oxygen for COPD Chest pain atypical not related to exertion has had for months [employer] Can wake her from sleep not related to food. Norco helps. Has never had stress test.      Past Medical History  Diagnosis Date  . CHF (congestive heart failure) ([place])   . Anxiety   . Depression   . Chronic back pain   . Right lumbar radiculopathy   . Fibromyalgia   . Arthritis     Rheumatoid  . Gout   . Vitiligo     face    Past Surgical History  Procedure Laterality Date  . Tubal ligation    . Cesarean section    . Dilitation & currettage/hystroscopy with novasure ablation N/A 07/07/2015    Procedure: HYSTEROSCOPY, UTERINE CURETTAGE, ENDOMETRIAL  ABLATION Uterine Cavity Length=6.5cm Uterine Cavity Width=4.5cm Power=161 Watts Time=1 minute 19 seconds;  Surgeon: Florian Buff, MD;  Location: [facility];  Service: Gynecology;  Laterality: N/A;  . Polypectomy  07/07/2015    Procedure: POLYPECTOMY;  Surgeon: Florian Buff, MD;  Location: [facility];  Service: Gynecology;;  . Vaginal hysterectomy N/A 10/13/2015    Procedure: HYSTERECTOMY VAGINAL;  Surgeon: Florian Buff, MD;  Location: [facility];  Service: Gynecology;  Laterality: N/A;  . Salpingoophorectomy Bilateral 10/13/2015    Procedure: BILATERAL SALPINGO OOPHORECTOMY;  Surgeon: Florian Buff, MD;  Location: [facility];  Service: Gynecology;  Laterality: Bilateral;    Current Medications: Outpatient Prescriptions Prior to Visit  Medication Sig Dispense Refill  . albuterol (PROVENTIL) (2.5  MG/3ML) 0.083% nebulizer solution Take 3 mLs (2.5 mg total) by nebulization every 6 (six) hours as needed for wheezing or shortness of breath. 150 mL 1  . busPIRone (BUSPAR) 15 MG tablet Take 15 mg by mouth 3 (three) times daily.    . diclofenac sodium (VOLTAREN) 1 % GEL Apply 2 g topically 4 (four) times daily. 100 g 2  . DULoxetine (CYMBALTA) 60 MG capsule TAKE ONE CAPSULE BY MOUTH ONCE DAILY 30 capsule 5  . estradiol (ESTRACE) 2 MG tablet     . ferrous sulfate 324 (65 Fe) MG TBEC Take 1 tablet (325 mg total) by mouth daily. 30 tablet 3  . HYDROcodone-acetaminophen (NORCO) 7.5-325 MG tablet Take 1 tablet by mouth every 4 (four) hours as needed for moderate pain (Must last 30 days.  Do not drive or operate machinery while taking this medicine.). 120 tablet 0  . ibuprofen (ADVIL,MOTRIN) 800 MG tablet     . LINZESS 145 MCG CAPS capsule Take 145 mcg by mouth daily.     Marland Kitchen omeprazole (PRILOSEC) 20 MG capsule Take 1 capsule (20 mg total) by mouth daily. 1 tablet a day 90 capsule 0  . pregabalin (LYRICA) 75 MG capsule Take 1 capsule (75 mg total) by mouth 2 (two) times daily. 60 capsule 2  . traMADol (ULTRAM) 50 MG tablet Take 1 tablet (50 mg total) by mouth every 6 (six) hours as needed for  moderate pain. 120 tablet 0   No facility-administered medications prior to visit.     Allergies:   Toradol; Lyrica; and Penicillins   Social History   Social History  . Marital Status: Divorced    Spouse Name: N/A  . Number of Children: N/A  . Years of Education: N/A   Social History Main Topics  . Smoking status: Current Every Day Smoker -- 1.00 packs/day for 30 years    Types: Cigarettes  . Smokeless tobacco: Former Systems developer  . Alcohol Use: No  . Drug Use: No     Comment: Hx of marijuana use - None now  . Sexual Activity: Not Currently    Birth Control/ Protection: Surgical   Other Topics Concern  . None   Social History Narrative     Family History:  The patient's family history with cancer in  parents mom currently alive with intestinal  Cancer dad died of colon cancer. Has handicapped son 30 living with her Domingo Mend' is in jail a lot Home situation seems bleek Occasional ETOH no drugs    ROS:   Please see the history of present illness.    ROS All other systems reviewed and are negative.   PHYSICAL EXAM:   VS:  BP 130/76 mmHg  Pulse 76  Ht 5\' 2"  (1.575 m)  Wt 181 lb (82.101 kg)  BMI 33.10 kg/m2  SpO2 98%  LMP 09/12/2015 (Exact Date)   GEN: Well nourished, well developed, in no acute distress HEENT: normal Neck: no JVD, carotid bruits, or masses Cardiac: SEM  murmurs, rubs, or gallops,no edema  Respiratory:  Decreased BS right no active wheezing  GI: soft, nontender, nondistended, + BS MS: no deformity or atrophy Skin: warm and dry, Vitiligo  Neuro:  Alert and Oriented x 3, Strength and sensation are intact Psych: euthymic mood, full affect  Wt Readings from Last 3 Encounters:  02/24/16 181 lb (82.101 kg)  02/23/16 182 lb (82.555 kg)  02/15/16 185 lb 3.2 oz (84.006 kg)      Studies/Labs Reviewed:   EKG:  02/15/16  SR rate 57 normal ECG   Recent Labs: 10/14/2015: Hemoglobin 11.5* 12/31/2015: ALT 27; BUN 11; Creatinine, Ser 0.89; Platelets 329; Potassium 4.7; Sodium 142   Lipid Panel    Component Value Date/Time   CHOL 197 12/31/2015 0949   TRIG 123 12/31/2015 0949   HDL 48 12/31/2015 0949   CHOLHDL 4.1 12/31/2015 0949   LDLCALC 124* 12/31/2015 0949    Additional studies/ records that were reviewed today include:  Notes [facility] DR Dettinger ECG and CXR    ASSESSMENT:    No diagnosis found.   PLAN:  In order of problems listed above:  1. Chest Pain atypical likely related to fibromyalgia f/u lexiscan myovue 2. Chol: continue diet Rx 3. HTN Well controlled.  Continue current medications and low sodium Dash type diet.   4. COPD continue nebulizers and oxygen.  Needs referral for sleep study     Medication Adjustments/Labs and Tests  Ordered: Current medicines are reviewed at length with the patient today.  Concerns regarding medicines are outlined above.  Medication changes, Labs and Tests ordered today are listed in the Patient Instructions below. There are no Patient Instructions on file for this visit.   Signed, Jenkins Rouge, MD  02/24/2016 9:54 AM    [address], [place], [place]  [address] Phone: [phone]; Fax: [phone]

## 2016-02-23 NOTE — Progress Notes (Signed)
CC: Both of my knees are hurting. I would like an injection in both knees.  The patient has had chronic pain and tenderness of both knees for some time.  Injections help.  There is no locking or giving way of the knee.  There is no new trauma. There is no redness or signs of infections.  The knees have a mild effusion and some crepitus.  There is no redness or signs of recent trauma.  Impression:  Chronic pain of the both knees  Return:  One month.  PROCEDURE NOTE:  The patient requests injections of the right knee , verbal consent was obtained.  The right knee was prepped appropriately after time out was performed.   Sterile technique was observed and injection of 1 cc of Depo-Medrol 40 mg with several cc's of plain xylocaine. Anesthesia was provided by ethyl chloride and a 20-gauge needle was used to inject the knee area. The injection was tolerated well.  A band aid dressing was applied.  The patient was advised to apply ice later today and tomorrow to the injection sight as needed.  PROCEDURE NOTE:  The patient requests injections of the left knee , verbal consent was obtained.  The left knee was prepped appropriately after time out was performed.   Sterile technique was observed and injection of 1 cc of Depo-Medrol 40 mg with several cc's of plain xylocaine. Anesthesia was provided by ethyl chloride and a 20-gauge needle was used to inject the knee area. The injection was tolerated well.  A band aid dressing was applied.  The patient was advised to apply ice later today and tomorrow to the injection sight as needed.  Electronically Signed Sanjuana Kava, MD 6/14/20178:32 AM

## 2016-02-24 ENCOUNTER — Telehealth: Payer: Self-pay | Admitting: Family Medicine

## 2016-02-24 ENCOUNTER — Encounter: Payer: Self-pay | Admitting: *Deleted

## 2016-02-24 ENCOUNTER — Telehealth: Payer: Self-pay | Admitting: Cardiovascular Disease

## 2016-02-24 ENCOUNTER — Ambulatory Visit (INDEPENDENT_AMBULATORY_CARE_PROVIDER_SITE_OTHER): Payer: Commercial Managed Care - HMO | Admitting: Cardiovascular Disease

## 2016-02-24 ENCOUNTER — Encounter: Payer: Self-pay | Admitting: Cardiovascular Disease

## 2016-02-24 VITALS — BP 130/76 | HR 76 | Ht 62.0 in | Wt 181.0 lb

## 2016-02-24 DIAGNOSIS — R079 Chest pain, unspecified: Secondary | ICD-10-CM | POA: Diagnosis not present

## 2016-02-24 DIAGNOSIS — R011 Cardiac murmur, unspecified: Secondary | ICD-10-CM | POA: Diagnosis not present

## 2016-02-24 NOTE — Telephone Encounter (Signed)
Patient states that she needs portable oxygen but I do not see the need in the notes. Do you feel like this is something that she needs? Please advise and route back to me

## 2016-02-24 NOTE — Patient Instructions (Signed)
Your physician recommends that you schedule a follow-up appointment in: As needed with Dr. Johnsie Cancel  Your physician recommends that you continue on your current medications as directed. Please refer to the Current Medication list given to you today.  Your physician has requested that you have an echocardiogram. Echocardiography is a painless test that uses sound waves to create images of your heart. It provides your doctor with information about the size and shape of your heart and how well your heart's chambers and valves are working. This procedure takes approximately one hour. There are no restrictions for this procedure.   Your physician has requested that you have a lexiscan myoview. For further information please visit [email or web address]. Please follow instruction sheet, as given.  If you need a refill on your cardiac medications before your next appointment, please call your pharmacy.  Thank you for choosing [facility]!

## 2016-02-24 NOTE — Telephone Encounter (Signed)
Order placed.   Laroy Apple, MD [facility] 02/24/2016, 7:40 AM

## 2016-02-24 NOTE — Telephone Encounter (Signed)
Schedule following test please  ECHO & Lexi

## 2016-02-24 NOTE — Telephone Encounter (Signed)
She would need to be seen for this,.   Laroy Apple, MD [facility] 02/24/2016, 12:33 PM

## 2016-02-25 ENCOUNTER — Other Ambulatory Visit: Payer: Self-pay | Admitting: Family Medicine

## 2016-02-25 ENCOUNTER — Ambulatory Visit (INDEPENDENT_AMBULATORY_CARE_PROVIDER_SITE_OTHER): Payer: Commercial Managed Care - HMO | Admitting: Family

## 2016-02-25 ENCOUNTER — Telehealth: Payer: Self-pay | Admitting: Family Medicine

## 2016-02-25 ENCOUNTER — Encounter: Payer: Self-pay | Admitting: Family

## 2016-02-25 VITALS — BP 134/89 | HR 66 | Temp 97.2°F | Ht 62.0 in | Wt 183.0 lb

## 2016-02-25 DIAGNOSIS — M545 Low back pain, unspecified: Secondary | ICD-10-CM

## 2016-02-25 DIAGNOSIS — R0681 Apnea, not elsewhere classified: Secondary | ICD-10-CM

## 2016-02-25 DIAGNOSIS — R0683 Snoring: Secondary | ICD-10-CM

## 2016-02-25 LAB — URINALYSIS, COMPLETE
BILIRUBIN UA: NEGATIVE
Glucose, UA: NEGATIVE
Ketones, UA: NEGATIVE
LEUKOCYTES UA: NEGATIVE
Nitrite, UA: NEGATIVE
PROTEIN UA: NEGATIVE
Specific Gravity, UA: 1.01 (ref 1.005–1.030)
Urobilinogen, Ur: 0.2 mg/dL (ref 0.2–1.0)
pH, UA: 7 (ref 5.0–7.5)

## 2016-02-25 LAB — MICROSCOPIC EXAMINATION

## 2016-02-25 MED ORDER — PREDNISONE 10 MG (21) PO TBPK: 10.0000 mg | ORAL_TABLET | Freq: Every day | ORAL | Status: DC

## 2016-02-25 NOTE — Patient Instructions (Signed)

## 2016-02-25 NOTE — Telephone Encounter (Signed)
Patient coming in today for evaluation for oxygen

## 2016-02-25 NOTE — Progress Notes (Signed)
   Subjective:    Patient ID: Lynn Logan, female    DOB: 1971-05-28, 45 y.o.   MRN: [id number]  Pt presents to the office today for back pain, snoring, and apnea at night. Pt states her son tells her she "stops breathing" several times during the night. PT goes to [facility] for GAD, depression, bipolar, and schizophrenia every 2 months. Pt goes to pain clinic every 3 months for ostearthritis and fibromyalgia.  Back Pain This is a new problem. The current episode started in the past 7 days. The problem occurs constantly. The problem is unchanged. The pain is present in the lumbar spine. The quality of the pain is described as aching. The pain is at a severity of 9/10. The pain is moderate. The symptoms are aggravated by lying down. Associated symptoms include dysuria and leg pain. Pertinent negatives include no bladder incontinence, bowel incontinence, headaches, numbness, paresthesias, pelvic pain, perianal numbness or tingling. Risk factors include obesity. She has tried analgesics and NSAIDs for the symptoms. The treatment provided mild relief.      Review of Systems  Constitutional: Negative.   HENT: Negative.   Eyes: Negative.   Respiratory: Negative.  Negative for shortness of breath.   Cardiovascular: Negative.  Negative for palpitations.  Gastrointestinal: Negative.  Negative for bowel incontinence.  Endocrine: Negative.   Genitourinary: Positive for dysuria. Negative for bladder incontinence and pelvic pain.  Musculoskeletal: Positive for back pain.  Neurological: Negative.  Negative for tingling, numbness, headaches and paresthesias.  Hematological: Negative.   Psychiatric/Behavioral: Negative.   All other systems reviewed and are negative.      Objective:   Physical Exam  Constitutional: She is oriented to person, place, and time. She appears well-developed and well-nourished. No distress.  Eyes: Pupils are equal, round, and reactive to light.  Neck: Normal range of  motion. Neck supple. No thyromegaly present.  Cardiovascular: Normal rate, regular rhythm, normal heart sounds and intact distal pulses.   No murmur heard. Pulmonary/Chest: Effort normal and breath sounds normal. No respiratory distress. She has no wheezes.  Abdominal: Soft. Bowel sounds are normal. She exhibits no distension. There is no tenderness.  Musculoskeletal: Normal range of motion. She exhibits no edema or tenderness.  Neurological: She is alert and oriented to person, place, and time.  Skin: Skin is warm and dry.  Psychiatric: She has a normal mood and affect. Her behavior is normal. Judgment and thought content normal.  Vitals reviewed.     BP 134/89 mmHg  Pulse 66  Temp(Src) 97.2 F (36.2 C) (Oral)  Ht 5\' 2"  (1.575 m)  Wt 183 lb (83.008 kg)  BMI 33.46 kg/m2  SpO2 99%  LMP 09/12/2015 (Exact Date)     Assessment & Plan:  1. Bilateral low back pain without sciatica -Rest -Ice and heat as needed -ROM exercises discussed - Urinalysis, Complete - predniSONE (STERAPRED UNI-PAK 21 TAB) 10 MG (21) TBPK tablet; Take 1 tablet (10 mg total) by mouth daily. As directed x 6 days  Dispense: 21 tablet; Refill: 0  2. Snoring - Ambulatory referral to Sleep Studies  3. Apnea - Ambulatory referral to Sleep Studies  Evelina Dun, FNP

## 2016-02-25 NOTE — Telephone Encounter (Signed)
Patient coming in today for appt for O2 evaluation

## 2016-02-28 ENCOUNTER — Telehealth: Payer: Self-pay | Admitting: Family Medicine

## 2016-02-28 NOTE — Telephone Encounter (Signed)
To early pt aware

## 2016-02-29 ENCOUNTER — Encounter (HOSPITAL_COMMUNITY): Payer: Commercial Managed Care - HMO

## 2016-02-29 ENCOUNTER — Other Ambulatory Visit: Payer: Self-pay | Admitting: *Deleted

## 2016-03-01 ENCOUNTER — Ambulatory Visit: Payer: Commercial Managed Care - HMO | Admitting: Physical Therapy

## 2016-03-01 ENCOUNTER — Telehealth: Payer: Self-pay | Admitting: Family Medicine

## 2016-03-01 ENCOUNTER — Other Ambulatory Visit: Payer: Self-pay | Admitting: Obstetrics & Gynecology

## 2016-03-01 MED ORDER — ESTRADIOL 2 MG PO TABS: 2.0000 mg | ORAL_TABLET | Freq: Every day | ORAL | Status: DC

## 2016-03-01 NOTE — Telephone Encounter (Signed)
Pt did not qualify for oxygen she is aware

## 2016-03-01 NOTE — Telephone Encounter (Signed)
Would recommend being seen for evaluation of leg swelling.   Lynn Logan

## 2016-03-01 NOTE — Telephone Encounter (Signed)
Returned patient's phone call.  Patient states that physical therapy would like for her to have a fluid pill sent to pharmacy because knees are swollen.

## 2016-03-04 ENCOUNTER — Ambulatory Visit (INDEPENDENT_AMBULATORY_CARE_PROVIDER_SITE_OTHER): Payer: Commercial Managed Care - HMO | Admitting: Family

## 2016-03-04 ENCOUNTER — Encounter: Payer: Self-pay | Admitting: Family

## 2016-03-04 VITALS — BP 91/55 | HR 62 | Temp 97.2°F | Ht 62.0 in | Wt 183.0 lb

## 2016-03-04 DIAGNOSIS — R609 Edema, unspecified: Secondary | ICD-10-CM | POA: Diagnosis not present

## 2016-03-04 NOTE — Patient Instructions (Signed)
Edema °Edema is an abnormal buildup of fluids. It is more common in your legs and thighs. Painless swelling of the feet and ankles is more likely as a person ages. It also is common in looser skin, like around your eyes. °HOME CARE  °· Keep the affected body part above the level of the heart while lying down. °· Do not sit still or stand for a long time. °· Do not put anything right under your knees when you lie down. °· Do not wear tight clothes on your upper legs. °· Exercise your legs to help the puffiness (swelling) go down. °· Wear elastic bandages or support stockings as told by your doctor. °· A low-salt diet may help lessen the puffiness. °· Only take medicine as told by your doctor. °GET HELP IF: °· Treatment is not working. °· You have heart, liver, or kidney disease and notice that your skin looks puffy or shiny. °· You have puffiness in your legs that does not get better when you raise your legs. °· You have sudden weight gain for no reason. °GET HELP RIGHT AWAY IF:  °· You have shortness of breath or chest pain. °· You cannot breathe when you lie down. °· You have pain, redness, or warmth in the areas that are puffy. °· You have heart, liver, or kidney disease and get edema all of a sudden. °· You have a fever and your symptoms get worse all of a sudden. °MAKE SURE YOU:  °· Understand these instructions. °· Will watch your condition. °· Will get help right away if you are not doing well or get worse. °  °This information is not intended to replace advice given to you by your health care provider. Make sure you discuss any questions you have with your health care provider. °  °Document Released: 02/14/2008 Document Revised: 09/02/2013 Document Reviewed: 06/20/2013 °Elsevier Interactive Patient Education ©2016 Elsevier Inc. ° °

## 2016-03-04 NOTE — Progress Notes (Signed)
   Subjective:    Patient ID: Lynn Logan, female    DOB: 25-Dec-1970, 45 y.o.   MRN: [id number]  HPI PT presents to the office today with intermittent bilateral swelling in her legs. PT states it started about 3 days ago. PT states at night both ankles and knees are "big". Pt denies taking anything for it with no relief.    Review of Systems  Respiratory: Positive for shortness of breath and wheezing. Negative for chest tightness.   Cardiovascular: Positive for leg swelling. Negative for chest pain.       Objective:   Physical Exam  Constitutional: She is oriented to person, place, and time. She appears well-developed and well-nourished. No distress.  HENT:  Head: Normocephalic.  Eyes: Pupils are equal, round, and reactive to light.  Neck: Normal range of motion. Neck supple. No thyromegaly present.  Cardiovascular: Normal rate, regular rhythm, normal heart sounds and intact distal pulses.   No murmur heard. Pulmonary/Chest: Effort normal and breath sounds normal. No respiratory distress. She has no wheezes.  Abdominal: Soft. Bowel sounds are normal. She exhibits no distension. There is no tenderness.  Musculoskeletal: Normal range of motion. She exhibits no edema or tenderness.  Neurological: She is alert and oriented to person, place, and time.  Skin: Skin is warm and dry.  Psychiatric: She has a normal mood and affect. Her behavior is normal. Judgment and thought content normal.  Vitals reviewed.    BP 91/55 mmHg  Pulse 62  Temp(Src) 97.2 F (36.2 C) (Oral)  Ht 5\' 2"  (1.575 m)  Wt 183 lb (83.008 kg)  BMI 33.46 kg/m2  LMP 09/12/2015 (Exact Date)      Assessment & Plan:  1. Peripheral edema -Low salt diet -Compression hose as needed -Do not sit or stand for long periods of time -No edema present today during visit, keep feet elevated when possible RTO prn - Compression stockings  Evelina Dun, FNP

## 2016-03-06 ENCOUNTER — Ambulatory Visit (INDEPENDENT_AMBULATORY_CARE_PROVIDER_SITE_OTHER): Payer: Commercial Managed Care - HMO | Admitting: Family Medicine

## 2016-03-06 ENCOUNTER — Telehealth: Payer: Self-pay | Admitting: Family Medicine

## 2016-03-06 ENCOUNTER — Encounter: Payer: Self-pay | Admitting: Family Medicine

## 2016-03-06 VITALS — BP 133/89 | HR 69 | Temp 97.0°F | Ht 62.0 in | Wt 183.6 lb

## 2016-03-06 DIAGNOSIS — Z7251 High risk heterosexual behavior: Secondary | ICD-10-CM | POA: Diagnosis not present

## 2016-03-06 DIAGNOSIS — R1030 Lower abdominal pain, unspecified: Secondary | ICD-10-CM

## 2016-03-06 LAB — URINALYSIS, COMPLETE
Bilirubin, UA: NEGATIVE
Glucose, UA: NEGATIVE
Ketones, UA: NEGATIVE
LEUKOCYTES UA: NEGATIVE
NITRITE UA: NEGATIVE
PH UA: 7 (ref 5.0–7.5)
Protein, UA: NEGATIVE
SPEC GRAV UA: 1.015 (ref 1.005–1.030)
Urobilinogen, Ur: 0.2 mg/dL (ref 0.2–1.0)

## 2016-03-06 LAB — MICROSCOPIC EXAMINATION
RBC MICROSCOPIC, UA: NONE SEEN /HPF (ref 0–?)
WBC, UA: NONE SEEN /hpf (ref 0–?)

## 2016-03-06 MED ORDER — ONDANSETRON HCL 4 MG PO TABS: 4.0000 mg | ORAL_TABLET | Freq: Three times a day (TID) | ORAL | Status: DC | PRN

## 2016-03-06 NOTE — Progress Notes (Signed)
   HPI  Patient presents today several complaints.  Patient explains that she did physical therapy last Friday, the emergency room last Friday night, Saturday she was seen in our clinic, and today she presents here. She has unique complaints for each visit.  Friday night ER trip she states was for abdominal pain, blacking out spells, and chest pain. EKG was normal she states.  Blacking out spells She describes severe weakness in tingling in bilateral lower extremities. She states that she does not lose consciousness She has no weakness numbness 13 one today.  She explains that over the last 2 weeks she's had lower abdominal, just below the umbilicus pain off and on but crampy in nature and mild to moderate. She has not tolerated food or fluids. She states that she's eaten very little since that time  PMH: Smoking status noted ROS: Per HPI  Objective: BP 133/89 mmHg  Pulse 69  Temp(Src) 97 F (36.1 C) (Oral)  Ht 5' 2" (1.575 m)  Wt 183 lb 9.6 oz (83.28 kg)  BMI 33.57 kg/m2  LMP 09/12/2015 (Exact Date) Gen: NAD, alert, cooperative with exam HEENT: NCAT CV: RRR, good S1/S2, no murmur Resp: CTABL, no wheezes, non-labored Abd: Positive bowel sounds, generalized tenderness but specifically tenderness to palpation in right upper quadrant with no Murphy sign and tenderness to palpation in lower left and right quadrant., No guarding, soft Ext: No edema, warm Neuro: Alert and oriented, No gross deficits  Assessment and plan:  # Abdominal pain, weakness, nausea and vomiting. She's describing presyncopal episodes after a 2 week illness where she's not tolerated food very well. I suspect that she's having gradual decrease energy due to poor by mouth intake. Encouraged fluids, she is able to tolerate by mouth fluids. Encouraged continue zofran Rxd from Ed Requesting records from the ED One-week follow-up, return to clinic sooner if symptoms worsen   Workup with labs, ultrasound with  right upper quadrant tenderness and bilateral lower quadrant tenderness Requests hepatitis testing, she states that an old boyfriend has hepatitis C and she's concerned that she may have.     Orders Placed This Encounter  Procedures  . GC/Chlamydia Probe Amp  . US Abdomen Complete    Standing Status: Future     Number of Occurrences:      Standing Expiration Date: 05/06/2017    Order Specific Question:  Reason for Exam (SYMPTOM  OR DIAGNOSIS REQUIRED)    Answer:  lower abd pain, nasuea    Order Specific Question:  Preferred imaging location?    Answer:  [facility]  . CBC with Differential  . CMP14+EGFR  . Hepatitis C antibody  . Urinalysis, Complete  . HIV antibody (with reflex)     Laroy Apple, MD [facility] 03/06/2016, 8:21 AM

## 2016-03-06 NOTE — Telephone Encounter (Signed)
It wasn't, she stated she had some.   I have sent zofran.  Laroy Apple, MD [facility] 03/06/2016, 10:28 AM

## 2016-03-06 NOTE — Telephone Encounter (Signed)
Pt aware.

## 2016-03-06 NOTE — Telephone Encounter (Signed)
Not approved 

## 2016-03-06 NOTE — Patient Instructions (Signed)
Great to see you!  I have ordered an Ultrasound of your abdomen  I have also ordered several labs.   Lets plan to see you back on Thursday or Friday to follow up on your symptoms.

## 2016-03-07 LAB — CBC WITH DIFFERENTIAL/PLATELET
BASOS: 1 %
Basophils Absolute: 0.1 10*3/uL (ref 0.0–0.2)
EOS (ABSOLUTE): 0.1 10*3/uL (ref 0.0–0.4)
Eos: 1 %
HEMATOCRIT: 39.9 % (ref 34.0–46.6)
Hemoglobin: 12.7 g/dL (ref 11.1–15.9)
IMMATURE GRANS (ABS): 0 10*3/uL (ref 0.0–0.1)
IMMATURE GRANULOCYTES: 0 %
Lymphocytes Absolute: 2.2 10*3/uL (ref 0.7–3.1)
Lymphs: 23 %
MCH: 22.1 pg — ABNORMAL LOW (ref 26.6–33.0)
MCHC: 31.8 g/dL (ref 31.5–35.7)
MCV: 70 fL — AB (ref 79–97)
MONOS ABS: 0.5 10*3/uL (ref 0.1–0.9)
Monocytes: 5 %
NEUTROS ABS: 7 10*3/uL (ref 1.4–7.0)
NEUTROS PCT: 70 %
PLATELETS: 311 10*3/uL (ref 150–379)
RBC: 5.74 x10E6/uL — ABNORMAL HIGH (ref 3.77–5.28)
RDW: 18.6 % — AB (ref 12.3–15.4)
WBC: 9.9 10*3/uL (ref 3.4–10.8)

## 2016-03-07 LAB — CMP14+EGFR
ALT: 16 IU/L (ref 0–32)
AST: 15 IU/L (ref 0–40)
Albumin/Globulin Ratio: 1.5 (ref 1.2–2.2)
Albumin: 4 g/dL (ref 3.5–5.5)
Alkaline Phosphatase: 126 IU/L — ABNORMAL HIGH (ref 39–117)
BUN/Creatinine Ratio: 10 (ref 9–23)
BUN: 8 mg/dL (ref 6–24)
Bilirubin Total: <0.2 mg/dL (ref 0.0–1.2)
CALCIUM: 9 mg/dL (ref 8.7–10.2)
CO2: 22 mmol/L (ref 18–29)
CREATININE: 0.77 mg/dL (ref 0.57–1.00)
Chloride: 104 mmol/L (ref 96–106)
GFR, EST AFRICAN AMERICAN: 108 mL/min/{1.73_m2} (ref >59)
GFR, EST NON AFRICAN AMERICAN: 94 mL/min/{1.73_m2} (ref >59)
GLOBULIN, TOTAL: 2.7 g/dL (ref 1.5–4.5)
Glucose: 78 mg/dL (ref 65–99)
Potassium: 3.9 mmol/L (ref 3.5–5.2)
Sodium: 143 mmol/L (ref 134–144)
TOTAL PROTEIN: 6.7 g/dL (ref 6.0–8.5)

## 2016-03-07 LAB — HIV ANTIBODY (ROUTINE TESTING W REFLEX): HIV SCREEN 4TH GENERATION: NONREACTIVE

## 2016-03-07 LAB — HEPATITIS C ANTIBODY: Hep C Virus Ab: <0.1 s/co ratio (ref 0.0–0.9)

## 2016-03-08 ENCOUNTER — Ambulatory Visit (HOSPITAL_COMMUNITY)
Admission: RE | Admit: 2016-03-08 | Discharge: 2016-03-08 | Disposition: A | Discharge status: Home / Self Care | Payer: Commercial Managed Care - HMO | Source: Ambulatory Visit | Attending: Family Medicine | Admitting: Family Medicine

## 2016-03-08 ENCOUNTER — Other Ambulatory Visit: Payer: Self-pay | Admitting: *Deleted

## 2016-03-08 ENCOUNTER — Emergency Department (HOSPITAL_COMMUNITY)
Admission: EM | Admit: 2016-03-08 | Discharge: 2016-03-08 | Disposition: A | Discharge status: Home / Self Care | Payer: Commercial Managed Care - HMO | Attending: Dermatology | Admitting: Dermatology

## 2016-03-08 ENCOUNTER — Encounter (HOSPITAL_COMMUNITY): Payer: Self-pay | Admitting: Emergency Medicine

## 2016-03-08 ENCOUNTER — Telehealth: Payer: Self-pay | Admitting: Orthopaedic Surgery

## 2016-03-08 DIAGNOSIS — F17210 Nicotine dependence, cigarettes, uncomplicated: Secondary | ICD-10-CM | POA: Insufficient documentation

## 2016-03-08 DIAGNOSIS — J45909 Unspecified asthma, uncomplicated: Secondary | ICD-10-CM | POA: Insufficient documentation

## 2016-03-08 DIAGNOSIS — M1990 Unspecified osteoarthritis, unspecified site: Secondary | ICD-10-CM | POA: Diagnosis not present

## 2016-03-08 DIAGNOSIS — N644 Mastodynia: Secondary | ICD-10-CM | POA: Insufficient documentation

## 2016-03-08 DIAGNOSIS — I509 Heart failure, unspecified: Secondary | ICD-10-CM | POA: Insufficient documentation

## 2016-03-08 DIAGNOSIS — R1110 Vomiting, unspecified: Secondary | ICD-10-CM | POA: Diagnosis present

## 2016-03-08 DIAGNOSIS — R55 Syncope and collapse: Secondary | ICD-10-CM | POA: Insufficient documentation

## 2016-03-08 DIAGNOSIS — R05 Cough: Secondary | ICD-10-CM

## 2016-03-08 DIAGNOSIS — R059 Cough, unspecified: Secondary | ICD-10-CM

## 2016-03-08 DIAGNOSIS — Z5321 Procedure and treatment not carried out due to patient leaving prior to being seen by health care provider: Secondary | ICD-10-CM | POA: Diagnosis not present

## 2016-03-08 DIAGNOSIS — F329 Major depressive disorder, single episode, unspecified: Secondary | ICD-10-CM | POA: Diagnosis not present

## 2016-03-08 LAB — CBC WITH DIFFERENTIAL/PLATELET
BASOS ABS: 0.1 10*3/uL (ref 0.0–0.1)
Basophils Relative: 1 %
EOS ABS: 0.1 10*3/uL (ref 0.0–0.7)
EOS PCT: 1 %
HCT: 41.9 % (ref 36.0–46.0)
HEMOGLOBIN: 13.3 g/dL (ref 12.0–15.0)
LYMPHS ABS: 2.4 10*3/uL (ref 0.7–4.0)
LYMPHS PCT: 22 %
MCH: 22.1 pg — ABNORMAL LOW (ref 26.0–34.0)
MCHC: 31.7 g/dL (ref 30.0–36.0)
MCV: 69.7 fL — ABNORMAL LOW (ref 78.0–100.0)
Monocytes Absolute: 0.7 10*3/uL (ref 0.1–1.0)
Monocytes Relative: 6 %
NEUTROS PCT: 71 %
Neutro Abs: 7.8 10*3/uL — ABNORMAL HIGH (ref 1.7–7.7)
PLATELETS: 330 10*3/uL (ref 150–400)
RBC: 6.01 MIL/uL — AB (ref 3.87–5.11)
RDW: 18 % — ABNORMAL HIGH (ref 11.5–15.5)
WBC: 11 10*3/uL — AB (ref 4.0–10.5)

## 2016-03-08 LAB — GC/CHLAMYDIA PROBE AMP
Chlamydia trachomatis, NAA: NEGATIVE
Neisseria gonorrhoeae by PCR: NEGATIVE

## 2016-03-08 LAB — BASIC METABOLIC PANEL
ANION GAP: 8 (ref 5–15)
BUN: 13 mg/dL (ref 6–20)
CHLORIDE: 105 mmol/L (ref 101–111)
CO2: 21 mmol/L — ABNORMAL LOW (ref 22–32)
Calcium: 8.5 mg/dL — ABNORMAL LOW (ref 8.9–10.3)
Creatinine, Ser: 0.75 mg/dL (ref 0.44–1.00)
GFR calc Af Amer: >60 mL/min (ref >60)
GFR calc non Af Amer: >60 mL/min (ref >60)
Glucose, Bld: 89 mg/dL (ref 65–99)
POTASSIUM: 3.2 mmol/L — AB (ref 3.5–5.1)
SODIUM: 134 mmol/L — AB (ref 135–145)

## 2016-03-08 LAB — LIPASE, BLOOD: LIPASE: 21 U/L (ref 11–51)

## 2016-03-08 MED ORDER — LINZESS 145 MCG PO CAPS: 145.0000 ug | ORAL_CAPSULE | Freq: Every day | ORAL | Status: DC

## 2016-03-08 MED ORDER — GABAPENTIN 300 MG PO CAPS: 300.0000 mg | ORAL_CAPSULE | Freq: Three times a day (TID) | ORAL | Status: DC

## 2016-03-08 MED ORDER — MEGESTROL ACETATE 40 MG PO TABS: 40.0000 mg | ORAL_TABLET | Freq: Every day | ORAL | Status: DC

## 2016-03-08 MED ORDER — HYDROCODONE-ACETAMINOPHEN 5-325 MG PO TABS: 1.0000 | ORAL_TABLET | ORAL | Status: DC | PRN

## 2016-03-08 NOTE — Telephone Encounter (Signed)
Rx Done . 

## 2016-03-08 NOTE — Telephone Encounter (Signed)
Patient called and requested a refill on Hydrocodone-Acetaminophen (Norco)  7.5-325 mgs.  Qty  120    Sig - Route:  Take 1 tablet by mouth every 4 (four) hours as needed for moderate pain (Must last 30 days. Do not drive or operate machinery while taking this medicine.).

## 2016-03-08 NOTE — ED Notes (Signed)
Pt reports periods of "blacking out" and vomiting x 2 weeks. States that she will feel dizzy prior to falling. States that she saw PCP, but he was unable to figure out cause. AOx4. Gait steady and even.

## 2016-03-09 ENCOUNTER — Other Ambulatory Visit: Payer: Self-pay | Admitting: Family Medicine

## 2016-03-09 ENCOUNTER — Encounter (HOSPITAL_COMMUNITY)
Admission: RE | Admit: 2016-03-09 | Discharge: 2016-03-09 | Disposition: A | Discharge status: Home / Self Care | Payer: Commercial Managed Care - HMO | Source: Ambulatory Visit | Attending: Cardiovascular Disease | Admitting: Cardiovascular Disease

## 2016-03-09 ENCOUNTER — Ambulatory Visit (HOSPITAL_COMMUNITY)
Admission: RE | Admit: 2016-03-09 | Discharge: 2016-03-09 | Disposition: A | Discharge status: Home / Self Care | Payer: Commercial Managed Care - HMO | Source: Ambulatory Visit | Attending: Family Medicine | Admitting: Family Medicine

## 2016-03-09 ENCOUNTER — Ambulatory Visit (HOSPITAL_COMMUNITY)
Admission: RE | Admit: 2016-03-09 | Discharge: 2016-03-09 | Disposition: A | Discharge status: Home / Self Care | Payer: Commercial Managed Care - HMO | Source: Ambulatory Visit | Attending: Cardiovascular Disease | Admitting: Cardiovascular Disease

## 2016-03-09 ENCOUNTER — Encounter (HOSPITAL_COMMUNITY): Payer: Self-pay

## 2016-03-09 ENCOUNTER — Inpatient Hospital Stay (HOSPITAL_COMMUNITY): Admission: RE | Admit: 2016-03-09 | Payer: Commercial Managed Care - HMO | Source: Ambulatory Visit

## 2016-03-09 ENCOUNTER — Encounter (HOSPITAL_BASED_OUTPATIENT_CLINIC_OR_DEPARTMENT_OTHER)
Admission: RE | Admit: 2016-03-09 | Discharge: 2016-03-09 | Disposition: A | Discharge status: Home / Self Care | Payer: Commercial Managed Care - HMO | Source: Ambulatory Visit | Attending: Cardiovascular Disease | Admitting: Cardiovascular Disease

## 2016-03-09 ENCOUNTER — Encounter: Payer: Self-pay | Admitting: Family Medicine

## 2016-03-09 DIAGNOSIS — R1030 Lower abdominal pain, unspecified: Secondary | ICD-10-CM

## 2016-03-09 DIAGNOSIS — K824 Cholesterolosis of gallbladder: Secondary | ICD-10-CM | POA: Insufficient documentation

## 2016-03-09 DIAGNOSIS — K760 Fatty (change of) liver, not elsewhere classified: Secondary | ICD-10-CM | POA: Diagnosis not present

## 2016-03-09 DIAGNOSIS — I340 Nonrheumatic mitral (valve) insufficiency: Secondary | ICD-10-CM | POA: Insufficient documentation

## 2016-03-09 DIAGNOSIS — R011 Cardiac murmur, unspecified: Secondary | ICD-10-CM

## 2016-03-09 DIAGNOSIS — R079 Chest pain, unspecified: Secondary | ICD-10-CM | POA: Diagnosis not present

## 2016-03-09 HISTORY — DX: Unspecified asthma, uncomplicated: J45.909

## 2016-03-09 LAB — ECHOCARDIOGRAM COMPLETE
CHL CUP MV DEC (S): 261
E decel time: 261 msec
EERAT: 7.8
FS: 30 % (ref 28–44)
IVS/LV PW RATIO, ED: 1.02
LA diam end sys: 41 mm
LA diam index: 2.27 cm/m2
LA vol A4C: 46.9 ml
LA vol index: 26.6 mL/m2
LA vol: 48.1 mL
LASIZE: 41 mm
LV TDI E'LATERAL: 9.14
LV TDI E'MEDIAL: 6.85
LV dias vol index: 28 mL/m2
LV dias vol: 51 mL (ref 46–106)
LVEEAVG: 7.8
LVEEMED: 7.8
LVELAT: 9.14 cm/s
LVOT area: 3.14 cm2
LVOTD: 20 mm
MVPG: 2 mmHg
MVPKAVEL: 49.9 m/s
MVPKEVEL: 71.3 m/s
PW: 9.39 mm — AB (ref 0.6–1.1)
RV LATERAL S' VELOCITY: 11.2 cm/s
RV sys press: 21 mmHg
Reg peak vel: 212 cm/s
TAPSE: 19.5 mm
TR max vel: 212 cm/s

## 2016-03-09 LAB — NM MYOCAR MULTI W/SPECT W/WALL MOTION / EF
CHL CUP NUCLEAR SRS: 3
CHL CUP NUCLEAR SSS: 3
LV sys vol: 24 mL
LVDIAVOL: 72 mL (ref 46–106)
NUC STRESS TID: 1.1
Peak HR: 83 {beats}/min
RATE: 0.34
Rest HR: 54 {beats}/min
SDS: 0

## 2016-03-09 MED ORDER — TECHNETIUM TC 99M TETROFOSMIN IV KIT
30.0000 | PACK | Freq: Once | INTRAVENOUS | Status: AC | PRN
  Administered 2016-03-09: 32 via INTRAVENOUS

## 2016-03-09 MED ORDER — SODIUM CHLORIDE 0.9% FLUSH
INTRAVENOUS | Status: AC
  Administered 2016-03-09: 10 mL via INTRAVENOUS
  Filled 2016-03-09: qty 10

## 2016-03-09 MED ORDER — TECHNETIUM TC 99M TETROFOSMIN IV KIT
10.0000 | PACK | Freq: Once | INTRAVENOUS | Status: AC | PRN
  Administered 2016-03-09: 9.7 via INTRAVENOUS

## 2016-03-09 MED ORDER — REGADENOSON 0.4 MG/5ML IV SOLN
INTRAVENOUS | Status: AC
  Administered 2016-03-09: 0.4 mg via INTRAVENOUS
  Filled 2016-03-09: qty 5

## 2016-03-09 NOTE — Progress Notes (Signed)
*  PRELIMINARY RESULTS* Echocardiogram 2D Echocardiogram has been performed.  Lynn Logan 03/09/2016, 10:22 AM

## 2016-03-10 ENCOUNTER — Ambulatory Visit: Payer: Self-pay | Admitting: Nurse Practitioner

## 2016-03-10 ENCOUNTER — Ambulatory Visit: Payer: Commercial Managed Care - HMO | Admitting: Family Medicine

## 2016-03-13 ENCOUNTER — Telehealth: Payer: Self-pay | Admitting: Family Medicine

## 2016-03-13 ENCOUNTER — Ambulatory Visit: Payer: Commercial Managed Care - HMO | Admitting: Family Medicine

## 2016-03-13 ENCOUNTER — Telehealth: Payer: Self-pay | Admitting: Cardiovascular Disease

## 2016-03-13 NOTE — Telephone Encounter (Signed)
Spoke with patient and she is going to call cardiology back for Echo results.

## 2016-03-13 NOTE — Telephone Encounter (Signed)
Patient states she is returning call. CC called patient on 6/30 for results / tg

## 2016-03-13 NOTE — Telephone Encounter (Signed)
No one from this office called pt. Did not see where Dr. Johnsie Cancel resulted any tests to [name].

## 2016-03-15 ENCOUNTER — Telehealth: Payer: Self-pay | Admitting: Family Medicine

## 2016-03-15 NOTE — Telephone Encounter (Signed)
Patient had Echo 6/29 at [facility] and was told to get in touch with her PCP to see what to do next? Advised patient you were out of the office and wouldn't be back until Thursday. Patient verbalizes understanding and said that was fine.Please advise

## 2016-03-15 NOTE — Telephone Encounter (Signed)
Per [employer], she mailed the pt a letter letting her know her test results were normal. Pt stated she never got the letter, I let pt know her tests were normal. She voiced understanding. Sent copy to test to pcp.

## 2016-03-15 NOTE — Telephone Encounter (Signed)
-----   Message from Josue Hector, MD sent at 03/15/2016  9:55 AM EDT ----- Regarding: RE: results to cardiac tests Please read the notes in chart resulted 6/30 Barbarann Ehlers Tried to call both numbers in chart no answer and mailed reslutls  To patient see my note stress test normal just diaphragmatic attenuation  ----- Message -----    From: Drema Dallas, CMA    Sent: 03/15/2016   9:00 AM      To: Josue Hector, MD Subject: results to cardiac tests                       The above pt has called to get the results to her cardiac tests, but I do not see where they have been resulted in the chart. I saw where a nurse had called, but left pt a voicemail. Could you please result it to me or one of [name] here @ the [place] office so we can let the pt know. Thanks, Vilinda Blanks.

## 2016-03-15 NOTE — Telephone Encounter (Signed)
Follow Up:   Pt said Lynn Logan said that you were trying to contact her. She said this was about her test results and admitting her to the hospital for open heart surgery.

## 2016-03-16 ENCOUNTER — Ambulatory Visit: Payer: Commercial Managed Care - HMO | Admitting: Orthopaedic Surgery

## 2016-03-16 ENCOUNTER — Encounter: Payer: Self-pay | Admitting: Orthopaedic Surgery

## 2016-03-16 NOTE — Telephone Encounter (Signed)
Pt can follow up as needed, no other testing is necessary. Her stress test was normal and her Echo showed a mild form of heart failure which we can discuss in person.   She does not require additional medications at this time.   Laroy Apple, MD [facility] 03/16/2016, 7:41 AM

## 2016-03-21 ENCOUNTER — Telehealth: Payer: Self-pay | Admitting: Family Medicine

## 2016-03-22 NOTE — Telephone Encounter (Signed)
Pt having continued nausea with vomiting appt scheduled with Dr Wendi Snipes

## 2016-03-22 NOTE — Telephone Encounter (Signed)
Pt does not qualify, no diagnosis other than stress incontinense

## 2016-03-23 ENCOUNTER — Ambulatory Visit: Payer: Commercial Managed Care - HMO | Admitting: Family Medicine

## 2016-03-23 ENCOUNTER — Telehealth: Payer: Self-pay | Admitting: Family Medicine

## 2016-03-24 ENCOUNTER — Emergency Department (HOSPITAL_COMMUNITY): Payer: Medicare Other

## 2016-03-24 ENCOUNTER — Emergency Department (HOSPITAL_COMMUNITY)
Admission: EM | Admit: 2016-03-24 | Discharge: 2016-03-25 | Disposition: A | Discharge status: Home / Self Care | Payer: Medicare Other | Attending: Emergency Medicine | Admitting: Emergency Medicine

## 2016-03-24 ENCOUNTER — Encounter (HOSPITAL_COMMUNITY): Payer: Self-pay

## 2016-03-24 DIAGNOSIS — F17210 Nicotine dependence, cigarettes, uncomplicated: Secondary | ICD-10-CM | POA: Diagnosis not present

## 2016-03-24 DIAGNOSIS — R0602 Shortness of breath: Secondary | ICD-10-CM | POA: Insufficient documentation

## 2016-03-24 DIAGNOSIS — R109 Unspecified abdominal pain: Secondary | ICD-10-CM

## 2016-03-24 DIAGNOSIS — F329 Major depressive disorder, single episode, unspecified: Secondary | ICD-10-CM | POA: Insufficient documentation

## 2016-03-24 DIAGNOSIS — I509 Heart failure, unspecified: Secondary | ICD-10-CM | POA: Diagnosis not present

## 2016-03-24 DIAGNOSIS — R1084 Generalized abdominal pain: Secondary | ICD-10-CM | POA: Diagnosis not present

## 2016-03-24 DIAGNOSIS — M1990 Unspecified osteoarthritis, unspecified site: Secondary | ICD-10-CM | POA: Diagnosis not present

## 2016-03-24 DIAGNOSIS — R079 Chest pain, unspecified: Secondary | ICD-10-CM | POA: Diagnosis not present

## 2016-03-24 DIAGNOSIS — Z79899 Other long term (current) drug therapy: Secondary | ICD-10-CM | POA: Diagnosis not present

## 2016-03-24 DIAGNOSIS — F419 Anxiety disorder, unspecified: Secondary | ICD-10-CM | POA: Diagnosis not present

## 2016-03-24 DIAGNOSIS — J45909 Unspecified asthma, uncomplicated: Secondary | ICD-10-CM | POA: Diagnosis not present

## 2016-03-24 DIAGNOSIS — R509 Fever, unspecified: Secondary | ICD-10-CM | POA: Insufficient documentation

## 2016-03-24 DIAGNOSIS — R1111 Vomiting without nausea: Secondary | ICD-10-CM | POA: Diagnosis not present

## 2016-03-24 DIAGNOSIS — R1110 Vomiting, unspecified: Secondary | ICD-10-CM | POA: Diagnosis not present

## 2016-03-24 DIAGNOSIS — K5730 Diverticulosis of large intestine without perforation or abscess without bleeding: Secondary | ICD-10-CM | POA: Diagnosis not present

## 2016-03-24 DIAGNOSIS — R05 Cough: Secondary | ICD-10-CM | POA: Diagnosis not present

## 2016-03-24 DIAGNOSIS — R1031 Right lower quadrant pain: Secondary | ICD-10-CM | POA: Diagnosis not present

## 2016-03-24 LAB — HEPATIC FUNCTION PANEL
ALT: 16 U/L (ref 14–54)
AST: 17 U/L (ref 15–41)
Albumin: 3.4 g/dL — ABNORMAL LOW (ref 3.5–5.0)
Alkaline Phosphatase: 95 U/L (ref 38–126)
BILIRUBIN INDIRECT: 0.2 mg/dL — AB (ref 0.3–0.9)
BILIRUBIN TOTAL: 0.3 mg/dL (ref 0.3–1.2)
Bilirubin, Direct: 0.1 mg/dL (ref 0.1–0.5)
Total Protein: 6.3 g/dL — ABNORMAL LOW (ref 6.5–8.1)

## 2016-03-24 LAB — BASIC METABOLIC PANEL
ANION GAP: 5 (ref 5–15)
BUN: 15 mg/dL (ref 6–20)
CHLORIDE: 109 mmol/L (ref 101–111)
CO2: 21 mmol/L — AB (ref 22–32)
Calcium: 8.2 mg/dL — ABNORMAL LOW (ref 8.9–10.3)
Creatinine, Ser: 0.86 mg/dL (ref 0.44–1.00)
GFR calc Af Amer: >60 mL/min (ref >60)
GFR calc non Af Amer: >60 mL/min (ref >60)
GLUCOSE: 103 mg/dL — AB (ref 65–99)
POTASSIUM: 3.3 mmol/L — AB (ref 3.5–5.1)
Sodium: 135 mmol/L (ref 135–145)

## 2016-03-24 LAB — CBC
HEMATOCRIT: 37.5 % (ref 36.0–46.0)
HEMOGLOBIN: 12.2 g/dL (ref 12.0–15.0)
MCH: 22.8 pg — AB (ref 26.0–34.0)
MCHC: 32.5 g/dL (ref 30.0–36.0)
MCV: 70 fL — ABNORMAL LOW (ref 78.0–100.0)
Platelets: 285 10*3/uL (ref 150–400)
RBC: 5.36 MIL/uL — ABNORMAL HIGH (ref 3.87–5.11)
RDW: 17.6 % — ABNORMAL HIGH (ref 11.5–15.5)
WBC: 10.9 10*3/uL — ABNORMAL HIGH (ref 4.0–10.5)

## 2016-03-24 LAB — TROPONIN I: Troponin I: <0.03 ng/mL (ref <0.03)

## 2016-03-24 LAB — LIPASE, BLOOD: LIPASE: 21 U/L (ref 11–51)

## 2016-03-24 MED ORDER — METOCLOPRAMIDE HCL 5 MG/ML IJ SOLN
10.0000 mg | Freq: Once | INTRAMUSCULAR | Status: AC
  Administered 2016-03-24: 10 mg via INTRAVENOUS
  Filled 2016-03-24: qty 2

## 2016-03-24 MED ORDER — ACETAMINOPHEN 325 MG PO TABS
650.0000 mg | ORAL_TABLET | Freq: Once | ORAL | Status: AC
  Administered 2016-03-24: 650 mg via ORAL
  Filled 2016-03-24: qty 2

## 2016-03-24 MED ORDER — GI COCKTAIL ~~LOC~~
30.0000 mL | Freq: Once | ORAL | Status: AC
  Administered 2016-03-24: 30 mL via ORAL
  Filled 2016-03-24: qty 30

## 2016-03-24 NOTE — ED Provider Notes (Signed)
CSN: [id number]     Arrival date & time 03/24/16  2132 History  By signing my name below, I, Dora Sims, attest that this documentation has been prepared under the direction and in the presence of physician practitioner, Merrily Pew, MD. Electronically Signed: Dora Sims, Scribe. 03/24/2016. 11:05 PM.     Chief Complaint  Patient presents with  . Anxiety  . Chest Pain    The history is provided by the patient. No language interpreter was used.     HPI Comments: Lynn Logan is a 45 y.o. female with pertinent PMHx of CHF, fibromyalgia, and asthma who presents to the Emergency Department complaining of sudden onset, constant, sharp, central chest pain beginning today. Pt reports that her chest pain presented after eating something earlier this evening. She states she has been vomiting every day for the past three weeks after eating; she denies any known cause of her vomiting. She notes that she vomited after eating tonight which worsened her chest pain. She notes that her chest pain has intermittently been radiating down her left lower extremity but notes that only her chest is in pain currently. Pt reports that she has experienced chest pain before and was told that it was due to her heart murmur. Pt also notes intermittent fever, new SOB ("more than usual") and diaphoresis beginning today. She notes her last BM was 3 days ago; she usually has bowel movements every 3 days. Pt reports that she smokes 3 packs a day. She denies alcohol or recreational drug use. She further denies rash, abdominal pain, or any other associated symptoms.  June 29th: pt had complete abdominal US which was negative and nuclear medicine stress test that was negative for any changes or aversions. Labs done then as well which are similar to today.  Past Medical History  Diagnosis Date  . CHF (congestive heart failure) ([place])   . Anxiety   . Depression   . Chronic back pain   . Right lumbar radiculopathy   .  Fibromyalgia   . Arthritis     Rheumatoid  . Gout   . Vitiligo     face  . Asthma    Past Surgical History  Procedure Laterality Date  . Tubal ligation    . Cesarean section    . Dilitation & currettage/hystroscopy with novasure ablation N/A 07/07/2015    Procedure: HYSTEROSCOPY, UTERINE CURETTAGE, ENDOMETRIAL  ABLATION Uterine Cavity Length=6.5cm Uterine Cavity Width=4.5cm Power=161 Watts Time=1 minute 19 seconds;  Surgeon: Florian Buff, MD;  Location: [facility];  Service: Gynecology;  Laterality: N/A;  . Polypectomy  07/07/2015    Procedure: POLYPECTOMY;  Surgeon: Florian Buff, MD;  Location: [facility];  Service: Gynecology;;  . Vaginal hysterectomy N/A 10/13/2015    Procedure: HYSTERECTOMY VAGINAL;  Surgeon: Florian Buff, MD;  Location: [facility];  Service: Gynecology;  Laterality: N/A;  . Salpingoophorectomy Bilateral 10/13/2015    Procedure: BILATERAL SALPINGO OOPHORECTOMY;  Surgeon: Florian Buff, MD;  Location: [facility];  Service: Gynecology;  Laterality: Bilateral;   Family History  Problem Relation Age of Onset  . Diabetes Mother   . Congestive Heart Failure Mother   . Depression Father   . Hypertension Father   . Cancer Father   . Heart disease Maternal Uncle    Social History  Substance Use Topics  . Smoking status: Heavy Tobacco Smoker -- 3.00 packs/day for 30 years    Types: Cigarettes  . Smokeless tobacco: Former Systems developer  . Alcohol Use:  No   OB History    No data available     Review of Systems  Constitutional: Positive for fever and diaphoresis.  Respiratory: Positive for shortness of breath.   Cardiovascular: Positive for chest pain.  Gastrointestinal: Negative for abdominal pain.  Musculoskeletal: Positive for myalgias (LLE intermittently).  Skin: Negative for rash.  All other systems reviewed and are negative.   Allergies  Toradol; Gabapentin; Hydrocodone; Lyrica; Tramadol; Zofran; and Penicillins  Home Medications   Prior to Admission medications    Medication Sig Start Date End Date Taking? Authorizing Provider  albuterol (PROVENTIL) (2.5 MG/3ML) 0.083% nebulizer solution Take 3 mLs (2.5 mg total) by nebulization every 6 (six) hours as needed for wheezing or shortness of breath. 02/21/16  Yes Timmothy Euler, MD  Cyanocobalamin (VITAMIN B-12 PO) Take 1 tablet by mouth daily.   Yes Historical Provider, MD  diclofenac sodium (VOLTAREN) 1 % GEL Apply 2 g topically 4 (four) times daily. 06/24/15  Yes Timmothy Euler, MD  DULoxetine (CYMBALTA) 60 MG capsule TAKE ONE CAPSULE BY MOUTH ONCE DAILY 12/21/15  Yes Timmothy Euler, MD  estradiol (ESTRACE) 2 MG tablet Take 1 tablet (2 mg total) by mouth daily. 03/01/16  Yes Florian Buff, MD  ferrous sulfate 324 (65 Fe) MG TBEC Take 1 tablet (325 mg total) by mouth daily. 01/06/16  Yes Timmothy Euler, MD  LINZESS 145 MCG CAPS capsule Take 1 capsule (145 mcg total) by mouth daily. 03/08/16  Yes Fransisca Kaufmann Dettinger, MD  megestrol (MEGACE) 40 MG tablet Take 1 tablet (40 mg total) by mouth daily. 03/08/16  Yes Fransisca Kaufmann Dettinger, MD  omeprazole (PRILOSEC) 20 MG capsule Take 1 capsule (20 mg total) by mouth daily. 1 tablet a day 02/18/16  Yes Timmothy Euler, MD  pregabalin (LYRICA) 75 MG capsule Take 1 capsule (75 mg total) by mouth 2 (two) times daily. 02/18/16  Yes Timmothy Euler, MD  gabapentin (NEURONTIN) 300 MG capsule Take 1 capsule (300 mg total) by mouth 3 (three) times daily. Patient not taking: Reported on 03/24/2016 03/08/16   Fransisca Kaufmann Dettinger, MD  HYDROcodone-acetaminophen (NORCO/VICODIN) 5-325 MG tablet Take 1 tablet by mouth every 4 (four) hours as needed for moderate pain (Must last 30 days.  Do not take and drive a car or use machinery.). Patient not taking: Reported on 03/24/2016 03/08/16   Sanjuana Kava, MD  ondansetron (ZOFRAN) 4 MG tablet Take 1 tablet (4 mg total) by mouth every 8 (eight) hours as needed for nausea or vomiting. Patient not taking: Reported on 03/24/2016 03/06/16   Timmothy Euler, MD  potassium chloride SA (K-DUR,KLOR-CON) 20 MEQ tablet Take 2 tablets (40 mEq total) by mouth 2 (two) times daily. 03/25/16   Corene Cornea Stiven Kaspar, MD   BP 106/56 mmHg  Pulse 62  Temp(Src) 97.8 F (36.6 C) (Oral)  Resp 17  Ht 5\' 2"  (1.575 m)  Wt 184 lb (83.462 kg)  BMI 33.65 kg/m2  SpO2 98%  LMP 09/12/2015 (Exact Date) Physical Exam  Constitutional: She is oriented to person, place, and time. She appears well-developed and well-nourished. No distress.  HENT:  Head: Normocephalic and atraumatic.  Eyes: Conjunctivae and EOM are normal.  Neck: Neck supple. No tracheal deviation present.  Cardiovascular: Normal rate and regular rhythm.  Exam reveals no gallop and no friction rub.   Distant heart sounds.  Pulmonary/Chest: Effort normal and breath sounds normal. No respiratory distress.  Tenderness over the sternum.  Abdominal: There is tenderness.  There is no rebound and no guarding.  Diffuse tenderness over abdomen. No CVA tenderness.  Musculoskeletal: Normal range of motion.  No back tenderness.  Neurological: She is alert and oriented to person, place, and time.  Skin: Skin is warm and dry. No rash noted.  Psychiatric: She has a normal mood and affect. Her behavior is normal.  Nursing note and vitals reviewed.   ED Course  Procedures (including critical care time)  DIAGNOSTIC STUDIES: Oxygen Saturation is 97% on RA, normal by my interpretation.    COORDINATION OF CARE: 11:05 PM Discussed treatment plan with pt at bedside and pt agreed to plan.  Labs Review Labs Reviewed  BASIC METABOLIC PANEL - Abnormal; Notable for the following:    Potassium 3.3 (*)    CO2 21 (*)    Glucose, Bld 103 (*)    Calcium 8.2 (*)    All other components within normal limits  CBC - Abnormal; Notable for the following:    WBC 10.9 (*)    RBC 5.36 (*)    MCV 70.0 (*)    MCH 22.8 (*)    RDW 17.6 (*)    All other components within normal limits  HEPATIC FUNCTION PANEL - Abnormal;  Notable for the following:    Total Protein 6.3 (*)    Albumin 3.4 (*)    Indirect Bilirubin 0.2 (*)    All other components within normal limits  TROPONIN I  LIPASE, BLOOD  TROPONIN I    Imaging Review Dg Chest 2 View  03/24/2016  CLINICAL DATA:  Initial evaluation for acute chest pain. EXAM: CHEST  2 VIEW COMPARISON:  Prior radiograph from 12/30/2015. FINDINGS: The cardiac and mediastinal silhouettes are stable in size and contour, and remain within normal limits. The lungs are normally inflated. No airspace consolidation, pleural effusion, or pulmonary edema is identified. There is no pneumothorax. No acute osseous abnormality identified. IMPRESSION: No active cardiopulmonary disease. Electronically Signed   By: Jeannine Boga M.D.   On: 03/24/2016 22:26   Ct Abdomen Pelvis W Contrast  03/25/2016  CLINICAL DATA:  45 year old female with vomiting. Evaluate for obstruction. EXAM: CT ABDOMEN AND PELVIS WITH CONTRAST TECHNIQUE: Multidetector CT imaging of the abdomen and pelvis was performed using the standard protocol following bolus administration of intravenous contrast. CONTRAST:  177mL ISOVUE-300 IOPAMIDOL (ISOVUE-300) INJECTION 61% COMPARISON:  Abdominal ultrasound dated 03/09/2016 FINDINGS: The visualized lung bases are clear. No intra-abdominal free air or free fluid. The liver, gallbladder, pancreas, spleen, adrenal glands, kidneys, visualized ureters, and urinary bladder appear unremarkable. Hysterectomy. The partially visualized 2.6 x 1.3 cm hypodense lesion in the left vaginal wall is not well characterized but may represent a Bartholin's gland cyst. Bilateral ovarian hypodensities may represent normal ovarian tissue versus follicle/cyst. Multiple surgical clips noted in the region of the ovaries. Ultrasound may provide better evaluation of the pelvic structures if clinically indicated. There is sigmoid diverticulosis without active inflammatory changes. There is no evidence of bowel  obstruction or active inflammation. Normal appendix. The abdominal aorta and IVC appear unremarkable. No portal venous gas identified. There is no adenopathy. The abdominal wall soft tissues appear unremarkable. The osseous structures are intact. IMPRESSION: No acute intra-abdominal pelvic pathology. No evidence of bowel obstruction or active inflammation. Normal appendix. Diverticulosis. Electronically Signed   By: Anner Crete M.D.   On: 03/25/2016 02:42   I have personally reviewed and evaluated these images and lab results as part of my medical decision-making.   EKG Interpretation  Date/Time:  Friday March 24 2016 21:37:17 EDT Ventricular Rate:  81 PR Interval:    QRS Duration: 106 QT Interval:  411 QTC Calculation: 478 R Axis:   26 Text Interpretation:  Sinus rhythm No significant change since last  tracing Confirmed by [facility] MD, Corene Cornea [phone]) on 03/24/2016 11:08:54 PM      MDM   Final diagnoses:  Abdominal pain, unspecified abdominal location    Low risk for ACS, delta troponins and serial ecg's ok.  Repeat abdominal exams benign,labs ok. Ct fine. Can continue following with pcp.  Slightly hypokalemic, will replenish and Rx for same.  PERC negative, doubt PE.    New Prescriptions: Discharge Medication List as of 03/25/2016  3:12 AM    START taking these medications   Details  potassium chloride SA (K-DUR,KLOR-CON) 20 MEQ tablet Take 2 tablets (40 mEq total) by mouth 2 (two) times daily., Starting 03/25/2016, Until Discontinued, Print         I have personally and contemperaneously reviewed labs and imaging and used in my decision making as above.   A medical screening exam was performed and I feel the patient has had an appropriate workup for their chief complaint at this time and likelihood of emergent condition existing is low and thus workup can continue on an outpatient basis.. Their vital signs are stable. They have been counseled on decision, discharge,  follow up and which symptoms necessitate immediate return to the emergency department.  They verbally stated understanding and agreement with plan and discharged in stable condition.    I personally performed the services described in this documentation, which was scribed in my presence. The recorded information has been reviewed and is accurate.   Merrily Pew, MD 03/25/16 [phone]

## 2016-03-24 NOTE — Telephone Encounter (Signed)
PT needs to be seen for abd pain

## 2016-03-24 NOTE — ED Notes (Signed)
Patient complaining of anxiety and chest pain that started a year ago.  Patient given 324 mg of ASA; Iv established, 20g Left hand.

## 2016-03-25 ENCOUNTER — Telehealth: Payer: Self-pay | Admitting: Family Medicine

## 2016-03-25 ENCOUNTER — Emergency Department (HOSPITAL_COMMUNITY): Payer: Medicare Other

## 2016-03-25 DIAGNOSIS — K5730 Diverticulosis of large intestine without perforation or abscess without bleeding: Secondary | ICD-10-CM | POA: Diagnosis not present

## 2016-03-25 DIAGNOSIS — R109 Unspecified abdominal pain: Secondary | ICD-10-CM | POA: Diagnosis not present

## 2016-03-25 DIAGNOSIS — R1110 Vomiting, unspecified: Secondary | ICD-10-CM | POA: Diagnosis not present

## 2016-03-25 DIAGNOSIS — J45909 Unspecified asthma, uncomplicated: Secondary | ICD-10-CM | POA: Diagnosis not present

## 2016-03-25 LAB — TROPONIN I

## 2016-03-25 MED ORDER — POTASSIUM CHLORIDE CRYS ER 20 MEQ PO TBCR
40.0000 meq | EXTENDED_RELEASE_TABLET | Freq: Once | ORAL | Status: AC
  Administered 2016-03-25: 40 meq via ORAL
  Filled 2016-03-25: qty 2

## 2016-03-25 MED ORDER — IOPAMIDOL (ISOVUE-300) INJECTION 61%
100.0000 mL | Freq: Once | INTRAVENOUS | Status: AC | PRN
  Administered 2016-03-25: 100 mL via INTRAVENOUS

## 2016-03-25 MED ORDER — POTASSIUM CHLORIDE CRYS ER 20 MEQ PO TBCR: 40.0000 meq | EXTENDED_RELEASE_TABLET | Freq: Two times a day (BID) | ORAL | Status: DC

## 2016-03-27 NOTE — Telephone Encounter (Signed)
lmtcb

## 2016-03-28 ENCOUNTER — Ambulatory Visit: Payer: Commercial Managed Care - HMO | Admitting: Family Medicine

## 2016-03-28 ENCOUNTER — Ambulatory Visit (INDEPENDENT_AMBULATORY_CARE_PROVIDER_SITE_OTHER): Payer: Medicare Other | Admitting: Orthopaedic Surgery

## 2016-03-28 ENCOUNTER — Ambulatory Visit: Payer: Commercial Managed Care - HMO | Admitting: Family

## 2016-03-28 ENCOUNTER — Encounter: Payer: Self-pay | Admitting: Orthopaedic Surgery

## 2016-03-28 ENCOUNTER — Encounter: Payer: Self-pay | Admitting: Family Medicine

## 2016-03-28 VITALS — BP 141/87 | HR 78 | Ht 63.0 in | Wt 168.0 lb

## 2016-03-28 DIAGNOSIS — M25562 Pain in left knee: Secondary | ICD-10-CM

## 2016-03-28 DIAGNOSIS — M25561 Pain in right knee: Secondary | ICD-10-CM | POA: Diagnosis not present

## 2016-03-28 NOTE — Progress Notes (Signed)
CC: Both of my knees are hurting. I would like an injection in both knees.  The patient has had chronic pain and tenderness of both knees for some time.  Injections help.  There is no locking or giving way of the knee.  There is no new trauma. There is no redness or signs of infections.  The knees have a mild effusion and some crepitus.  There is no redness or signs of recent trauma.  Right knee ROM is 0-105 and left knee ROM is 0-110.  Impression:  Chronic pain of the both knees  Return:  1 month  PROCEDURE NOTE:  The patient requests injections of both knees, verbal consent was obtained.  The left and right knee were individually prepped appropriately after time out was performed.   Sterile technique was observed and injection of 1 cc of Depo-Medrol 40 mg with several cc's of plain xylocaine. Anesthesia was provided by ethyl chloride and a 20-gauge needle was used to inject each knee area. The injections were tolerated well.  A band aid dressing was applied.  The patient was advised to apply ice later today and tomorrow to the injection sight as needed.   Electronically Signed Sanjuana Kava, MD 7/18/20173:45 PM

## 2016-03-29 ENCOUNTER — Encounter: Payer: Self-pay | Admitting: Pediatrics

## 2016-03-29 ENCOUNTER — Telehealth: Payer: Self-pay | Admitting: Family

## 2016-03-29 ENCOUNTER — Encounter: Payer: Self-pay | Admitting: Family

## 2016-03-29 ENCOUNTER — Ambulatory Visit (INDEPENDENT_AMBULATORY_CARE_PROVIDER_SITE_OTHER): Payer: Medicare Other | Admitting: Pediatrics

## 2016-03-29 VITALS — BP 117/82 | HR 79 | Temp 97.1°F | Ht 63.0 in | Wt 183.0 lb

## 2016-03-29 DIAGNOSIS — J45909 Unspecified asthma, uncomplicated: Secondary | ICD-10-CM

## 2016-03-29 DIAGNOSIS — K226 Gastro-esophageal laceration-hemorrhage syndrome: Secondary | ICD-10-CM | POA: Diagnosis not present

## 2016-03-29 DIAGNOSIS — E876 Hypokalemia: Secondary | ICD-10-CM | POA: Diagnosis not present

## 2016-03-29 DIAGNOSIS — K5900 Constipation, unspecified: Secondary | ICD-10-CM | POA: Diagnosis not present

## 2016-03-29 MED ORDER — ALBUTEROL SULFATE (2.5 MG/3ML) 0.083% IN NEBU: 2.5000 mg | INHALATION_SOLUTION | Freq: Four times a day (QID) | RESPIRATORY_TRACT | Status: DC | PRN

## 2016-03-29 MED ORDER — POLYETHYLENE GLYCOL 3350 17 GM/SCOOP PO POWD: 17.0000 g | Freq: Two times a day (BID) | ORAL | Status: DC | PRN

## 2016-03-29 NOTE — Patient Instructions (Signed)
Take miralax every 2 hours until you have clear liquid stools

## 2016-03-29 NOTE — Progress Notes (Signed)
Subjective:    Patient ID: Lynn Logan, female    DOB: 02/16/1971, 45 y.o.   MRN: [id number]  CC: Nausea and Emesis   HPI: Lynn Logan is a 45 y.o. female presenting for Nausea and Emesis  Constipation: taking linzess once a day in the morning Not on any other medicines for constipation Takes iron daily Was on hydrocodone until recently ran out, not able to receive from our clinic as explained by previous provider Last stool 5 days ago Seen in ED 5 days ago for chest pain, negative cardiac work up Vomiting ongoing past 3 weeks Happens every morning  Followed by GI doctor at [facility] Had EGD in 03/2015 with mallory weiss tear, another EGD possibly in 09/2015 per records though not able to see report through care everywhere Has not followed up with GI since then Due for colonoscopy given fam hx of colon cancer in father at age 54yo but it looks like pt has needed to cancel previously scheduled procedures At initial GI consult in 05/2015 she was having similar complaints to those today, nausea with daily vomiting and constipation  No blood in stool, no diarrhea, no weight changes Appetite has been ok    Depression screen [facility] 2/9 03/29/2016 03/06/2016 02/25/2016 02/15/2016 02/02/2016  Decreased Interest '3 3 1 1 '$ 0  Down, Depressed, Hopeless '3 3 2 2 '$ 0  PHQ - 2 Score '6 6 3 3 '$ 0  Altered sleeping '3 3 2 2 '$ -  Tired, decreased energy '3 3 2 2 '$ -  Change in appetite '3 3 2 2 '$ -  Feeling bad or failure about yourself  '3 3 2 2 '$ -  Trouble concentrating '3 3 2 2 '$ -  Moving slowly or fidgety/restless '3 3 2 2 '$ -  Suicidal thoughts 0 0 2 2 -  PHQ-9 Score '24 24 17 17 '$ -  Difficult doing work/chores - - Somewhat difficult - -     Relevant past medical, surgical, family and social history reviewed and updated as indicated.  Interim medical history since our last visit reviewed. Allergies and medications reviewed and updated.  ROS: Per HPI unless specifically indicated above  History    Smoking status  . Heavy Tobacco Smoker -- 3.00 packs/day for 30 years  . Types: Cigarettes  Smokeless tobacco  . Former User       Objective:    BP 117/82 mmHg  Pulse 79  Temp(Src) 97.1 F (36.2 C) (Oral)  Ht '5\' 3"'$  (1.6 m)  Wt 183 lb (83.008 kg)  BMI 32.43 kg/m2  LMP 09/12/2015 (Exact Date)  Wt Readings from Last 3 Encounters:  03/29/16 183 lb (83.008 kg)  03/28/16 168 lb (76.204 kg)  03/24/16 184 lb (83.462 kg)     Gen: NAD, alert, cooperative with exam, NCAT EYES: EOMI, no scleral injection or icterus CV: NRRR, normal S1/S2, no murmur, distal pulses 2+ b/l Resp: CTABL, no wheezes, normal WOB Abd: +BS, soft, mildly tender with deep palpation, ND. no guarding or organomegaly Ext: No edema, warm Neuro: Alert and oriented MSK: normal muscle bulk     Assessment & Plan:    Saanvi was seen today for nausea and emesis. History of mallory weiss tear. Has noticed intermittent small amount of dark red material in emesis, most recently last night, had lasagna for dinner. Stools dark but pt on daily iron pills, stools unchanged. Will recheck CBC today. Discussed to ED for hematemesis. Needs to get back in to see GI doctor. Continue linzess,  add miralax q2h for next couple days for clean out. CT scan with large amount of fecal material.   Refilled albuterol. Lung exam normal today.  Diagnoses and all orders for this visit:  Constipation, unspecified constipation type -     polyethylene glycol powder (GLYCOLAX/MIRALAX) powder; Take 17 g by mouth 2 (two) times daily as needed.  Reactive airway disease, unspecified asthma severity, uncomplicated -     albuterol (PROVENTIL) (2.5 MG/3ML) 0.083% nebulizer solution; Take 3 mLs (2.5 mg total) by nebulization every 6 (six) hours as needed for wheezing or shortness of breath.  Mallory-Weiss tear -     CBC with Differential  Hypokalemia -     BMP8+EGFR  Follow up plan: Return if symptoms worsen or fail to improve.  Assunta Found, MD [facility] 03/29/2016, 9:51 AM

## 2016-03-30 ENCOUNTER — Telehealth: Payer: Self-pay | Admitting: Family Medicine

## 2016-03-30 DIAGNOSIS — M1990 Unspecified osteoarthritis, unspecified site: Secondary | ICD-10-CM | POA: Diagnosis not present

## 2016-03-30 DIAGNOSIS — G5601 Carpal tunnel syndrome, right upper limb: Secondary | ICD-10-CM | POA: Diagnosis not present

## 2016-03-30 LAB — CBC WITH DIFFERENTIAL/PLATELET
BASOS ABS: 0 10*3/uL (ref 0.0–0.2)
BASOS: 0 %
EOS (ABSOLUTE): 0 10*3/uL (ref 0.0–0.4)
Eos: 0 %
Hematocrit: 38.3 % (ref 34.0–46.6)
Hemoglobin: 12.6 g/dL (ref 11.1–15.9)
Immature Grans (Abs): 0 10*3/uL (ref 0.0–0.1)
Immature Granulocytes: 0 %
LYMPHS ABS: 1.4 10*3/uL (ref 0.7–3.1)
Lymphs: 8 %
MCH: 23.2 pg — AB (ref 26.6–33.0)
MCHC: 32.9 g/dL (ref 31.5–35.7)
MCV: 70 fL — AB (ref 79–97)
MONOS ABS: 0.4 10*3/uL (ref 0.1–0.9)
Monocytes: 3 %
NEUTROS ABS: 14.8 10*3/uL — AB (ref 1.4–7.0)
Neutrophils: 89 %
PLATELETS: 344 10*3/uL (ref 150–379)
RBC: 5.44 x10E6/uL — ABNORMAL HIGH (ref 3.77–5.28)
RDW: 17.9 % — ABNORMAL HIGH (ref 12.3–15.4)
WBC: 16.7 10*3/uL — ABNORMAL HIGH (ref 3.4–10.8)

## 2016-03-30 LAB — BMP8+EGFR
BUN / CREAT RATIO: 15 (ref 9–23)
BUN: 10 mg/dL (ref 6–24)
CHLORIDE: 103 mmol/L (ref 96–106)
CO2: 19 mmol/L (ref 18–29)
CREATININE: 0.67 mg/dL (ref 0.57–1.00)
Calcium: 8.9 mg/dL (ref 8.7–10.2)
GFR calc non Af Amer: 106 mL/min/{1.73_m2} (ref >59)
GFR, EST AFRICAN AMERICAN: 123 mL/min/{1.73_m2} (ref >59)
GLUCOSE: 130 mg/dL — AB (ref 65–99)
Potassium: 3.9 mmol/L (ref 3.5–5.2)
SODIUM: 140 mmol/L (ref 134–144)

## 2016-03-30 NOTE — Telephone Encounter (Signed)
I am ok with a refill but I do not treat with both as they have similar mechanisms.   Would recommend using only lyrica.   Ok to call in X 3 months if she is agreeable.    Laroy Apple, MD [facility] 03/30/2016, 3:14 PM

## 2016-03-31 DIAGNOSIS — M129 Arthropathy, unspecified: Secondary | ICD-10-CM | POA: Diagnosis not present

## 2016-04-03 ENCOUNTER — Ambulatory Visit: Payer: Commercial Managed Care - HMO | Admitting: Family Medicine

## 2016-04-03 ENCOUNTER — Telehealth: Payer: Self-pay | Admitting: Family Medicine

## 2016-04-04 ENCOUNTER — Telehealth: Payer: Self-pay | Admitting: Orthopaedic Surgery

## 2016-04-04 NOTE — Telephone Encounter (Signed)
Patient called to request refill on: HYDROcodone-acetaminophen (NORCO/VICODIN) 5-325 MG tablet [id number] - quantity 120.

## 2016-04-04 NOTE — Telephone Encounter (Signed)
Patient states that when she went to the ER on the 14th that the hospital recommended she get a hospital bed so she could raise the head of the bed. Please advise.

## 2016-04-05 MED ORDER — HYDROCODONE-ACETAMINOPHEN 5-325 MG PO TABS: 1.0000 | ORAL_TABLET | ORAL | 0 refills | Status: DC | PRN

## 2016-04-06 ENCOUNTER — Ambulatory Visit (INDEPENDENT_AMBULATORY_CARE_PROVIDER_SITE_OTHER): Payer: Medicare Other | Admitting: Family Medicine

## 2016-04-06 ENCOUNTER — Telehealth: Payer: Self-pay | Admitting: Family Medicine

## 2016-04-06 ENCOUNTER — Encounter: Payer: Self-pay | Admitting: Family Medicine

## 2016-04-06 VITALS — BP 123/86 | HR 75 | Temp 97.4°F | Ht 63.0 in | Wt 179.8 lb

## 2016-04-06 DIAGNOSIS — Z87898 Personal history of other specified conditions: Secondary | ICD-10-CM | POA: Diagnosis not present

## 2016-04-06 DIAGNOSIS — I5032 Chronic diastolic (congestive) heart failure: Secondary | ICD-10-CM | POA: Diagnosis not present

## 2016-04-06 DIAGNOSIS — F329 Major depressive disorder, single episode, unspecified: Secondary | ICD-10-CM | POA: Diagnosis not present

## 2016-04-06 DIAGNOSIS — R1084 Generalized abdominal pain: Secondary | ICD-10-CM | POA: Diagnosis not present

## 2016-04-06 DIAGNOSIS — F32A Depression, unspecified: Secondary | ICD-10-CM

## 2016-04-06 DIAGNOSIS — I5030 Unspecified diastolic (congestive) heart failure: Secondary | ICD-10-CM | POA: Insufficient documentation

## 2016-04-06 NOTE — Telephone Encounter (Signed)
Patient aware.

## 2016-04-06 NOTE — Patient Instructions (Signed)
   Please call psychiatry for an appointment  Please keep your appointment with GI

## 2016-04-06 NOTE — Telephone Encounter (Signed)
PT was appropriately scheduled an appointment today to discuss this.  Laroy Apple, MD [facility] 04/06/2016, 7:33 AM

## 2016-04-06 NOTE — Progress Notes (Signed)
   HPI  Patient presents today here with several complaints but primarily requesting a hospital bed.  Patient states that she was at the hospital recently when she was told that she should get a hospital bed to help her breathe better. She states that often she has difficult time breathing when she lies down flat at night. Her leg swelling is intermittent and resolved today. He denies any dyspnea currently. She also denies chest pain.  Abdominal pain Improved slightly after trying marijuana last States that she still not tolerating food normally but she is tolerating fluids. She has rescheduled her GI appointment.  Depression Patient is still taking her depression medication She states that she had increased depression and thoughts that she would be better off dead one day ago while she was having an argument with a family member. She states that currently she is not having any suicidal thoughts, and does not feel like she would be better off dead. She states that she would not hurt herself, but she was just very frustrated at the time.   PMH: Smoking status noted ROS: Per HPI  Objective: BP 123/86   Pulse 75   Temp 97.4 F (36.3 C) (Oral)   Ht 5\' 3"  (1.6 m)   Wt 179 lb 12.8 oz (81.6 kg)   LMP 09/12/2015 (Exact Date)   BMI 31.85 kg/m  Gen: NAD, alert, cooperative with exam HEENT: NCAT CV: RRR, good S1/S2, no murmur Resp: CTABL, no wheezes, non-labored Abd: Soft, generalized mild tenderness to palpation throughout, no guarding, positive bowel sounds Ext: No edema, warm Neuro: Alert and oriented, No gross deficits  Assessment and plan:  # Diastolic CHF, history of orthopnea I prescribed her a hospital bed today, I do not believe that she needs diuretics at this time, her weight is up a few pounds from 11 days ago and down a few pounds from 7 days ago.   # Depression She denies suicidal ideation or even thoughts of being better off dead currently. I have requested  that she see continue to see her psychiatrist for long-term treatment.  She was mistakenly written a new psychiatry referral, she'll he has an established psychiatrist.  # Abdominal pain Improved after miralax Tolerating fluids normally. Her weight is stable, she appears well and more energetic than she usually has.    Orders Placed This Encounter  Procedures  . Ambulatory referral to Psychiatry    Referral Priority:   Routine    Referral Type:   Psychiatric    Referral Reason:   Specialty Services Required    Requested Specialty:   Psychiatry    Number of Visits Requested:   1    No orders of the defined types were placed in this encounter.   Laroy Apple, MD [facility] 04/06/2016, 10:18 AM

## 2016-04-06 NOTE — Telephone Encounter (Signed)
I would like her to continue following up with [facility] as she is an established patient there. I actually canceled the referral.   I do not have a reason that I can document for her to have a handicap placard so I will not be able to provide this currently.   Laroy Apple, MD [facility] 04/06/2016, 1:44 PM

## 2016-04-10 ENCOUNTER — Telehealth: Payer: Self-pay | Admitting: Family

## 2016-04-10 NOTE — Telephone Encounter (Signed)
Pharmacy aware

## 2016-04-10 NOTE — Telephone Encounter (Signed)
20-30 lb

## 2016-04-10 NOTE — Telephone Encounter (Signed)
Covering PCP, Please advise.

## 2016-04-12 ENCOUNTER — Telehealth: Payer: Self-pay | Admitting: Family Medicine

## 2016-04-12 DIAGNOSIS — R609 Edema, unspecified: Secondary | ICD-10-CM | POA: Diagnosis not present

## 2016-04-13 NOTE — Telephone Encounter (Signed)
Denied, pt aware

## 2016-04-14 ENCOUNTER — Telehealth: Payer: Self-pay | Admitting: Family Medicine

## 2016-04-14 NOTE — Telephone Encounter (Signed)
Yes sent today, pt aware

## 2016-04-14 NOTE — Telephone Encounter (Signed)
Pt was requesting Lortabs but pt is seen by Dr. Luna Glasgow for pain management so advised her we can't give her any pain medications and that she would need to call Dr.Keeling. Pt voiced understanding.

## 2016-04-14 NOTE — Telephone Encounter (Signed)
All papers have been sent, pt aware

## 2016-04-17 DIAGNOSIS — I5030 Unspecified diastolic (congestive) heart failure: Secondary | ICD-10-CM | POA: Diagnosis not present

## 2016-04-25 DIAGNOSIS — J45909 Unspecified asthma, uncomplicated: Secondary | ICD-10-CM | POA: Diagnosis not present

## 2016-04-26 ENCOUNTER — Encounter: Payer: Self-pay | Admitting: Orthopaedic Surgery

## 2016-04-26 ENCOUNTER — Telehealth: Payer: Self-pay | Admitting: Family

## 2016-04-26 ENCOUNTER — Ambulatory Visit (INDEPENDENT_AMBULATORY_CARE_PROVIDER_SITE_OTHER): Payer: Medicare Other | Admitting: Orthopaedic Surgery

## 2016-04-26 VITALS — BP 121/86 | HR 78 | Temp 87.8°F | Ht 63.0 in | Wt 179.0 lb

## 2016-04-26 DIAGNOSIS — M25561 Pain in right knee: Secondary | ICD-10-CM

## 2016-04-26 DIAGNOSIS — M25562 Pain in left knee: Secondary | ICD-10-CM

## 2016-04-26 NOTE — Telephone Encounter (Signed)
Records were worked on 04/13/16

## 2016-04-26 NOTE — Progress Notes (Signed)
CC: Both of my knees are hurting. I would like an injection in both knees.  The patient has had chronic pain and tenderness of both knees for some time.  Injections help.  There is no locking or giving way of the knee.  There is no new trauma. There is no redness or signs of infections.  The knees have a mild effusion and some crepitus.  There is no redness or signs of recent trauma.  Right knee ROM is 0-110 and left knee ROM is 0-105.  Impression:  Chronic pain of the both knees  Return:  1 month  PROCEDURE NOTE:  The patient requests injections of both knees, verbal consent was obtained.  The left and right knee were individually prepped appropriately after time out was performed.   Sterile technique was observed and injection of 1 cc of Depo-Medrol 40 mg with several cc's of plain xylocaine. Anesthesia was provided by ethyl chloride and a 20-gauge needle was used to inject each knee area. The injections were tolerated well.  A band aid dressing was applied.  The patient was advised to apply ice later today and tomorrow to the injection sight as needed.   Electronically Signed Sanjuana Kava, MD 8/16/20179:02 AM

## 2016-04-27 ENCOUNTER — Ambulatory Visit: Payer: Medicare Other | Admitting: Orthopaedic Surgery

## 2016-05-03 ENCOUNTER — Telehealth: Payer: Self-pay | Admitting: Orthopaedic Surgery

## 2016-05-03 MED ORDER — HYDROCODONE-ACETAMINOPHEN 5-325 MG PO TABS: 1.0000 | ORAL_TABLET | ORAL | 0 refills | Status: DC | PRN

## 2016-05-03 NOTE — Telephone Encounter (Signed)
Hydrocodone-Acetaminophen  5/325mg  Qty 120 Tablets °

## 2016-05-08 DIAGNOSIS — E785 Hyperlipidemia, unspecified: Secondary | ICD-10-CM | POA: Diagnosis not present

## 2016-05-08 DIAGNOSIS — K229 Disease of esophagus, unspecified: Secondary | ICD-10-CM | POA: Diagnosis not present

## 2016-05-08 DIAGNOSIS — K219 Gastro-esophageal reflux disease without esophagitis: Secondary | ICD-10-CM | POA: Diagnosis not present

## 2016-05-08 DIAGNOSIS — R5382 Chronic fatigue, unspecified: Secondary | ICD-10-CM | POA: Diagnosis not present

## 2016-05-08 DIAGNOSIS — K226 Gastro-esophageal laceration-hemorrhage syndrome: Secondary | ICD-10-CM | POA: Diagnosis not present

## 2016-05-08 DIAGNOSIS — D509 Iron deficiency anemia, unspecified: Secondary | ICD-10-CM | POA: Diagnosis not present

## 2016-05-08 DIAGNOSIS — M542 Cervicalgia: Secondary | ICD-10-CM | POA: Diagnosis not present

## 2016-05-08 DIAGNOSIS — R011 Cardiac murmur, unspecified: Secondary | ICD-10-CM | POA: Diagnosis not present

## 2016-05-09 DIAGNOSIS — M542 Cervicalgia: Secondary | ICD-10-CM | POA: Diagnosis not present

## 2016-05-11 ENCOUNTER — Ambulatory Visit (INDEPENDENT_AMBULATORY_CARE_PROVIDER_SITE_OTHER): Payer: Medicare Other | Admitting: Pulmonary Disease

## 2016-05-11 ENCOUNTER — Encounter: Payer: Self-pay | Admitting: Pulmonary Disease

## 2016-05-11 VITALS — BP 112/68 | HR 60 | Ht 63.0 in | Wt 176.0 lb

## 2016-05-11 DIAGNOSIS — Z720 Tobacco use: Secondary | ICD-10-CM

## 2016-05-11 DIAGNOSIS — G4710 Hypersomnia, unspecified: Secondary | ICD-10-CM

## 2016-05-11 DIAGNOSIS — J449 Chronic obstructive pulmonary disease, unspecified: Secondary | ICD-10-CM | POA: Insufficient documentation

## 2016-05-11 DIAGNOSIS — J430 Unilateral pulmonary emphysema [MacLeod's syndrome]: Secondary | ICD-10-CM

## 2016-05-11 NOTE — Assessment & Plan Note (Signed)
Patient has mental health issues. She has depression, anxiety, schizophrenia, bipolar disorder. Also has insomnia. She is recently been through a lot of stress the last 3 months. She sees a mental health person for her mental health issues.  She has hypersomnia, snoring, witnessed apneas, gasping, choking. Hypersomnia affects her functionality. Given her recent mental health issues, her insomnia is worse and she claims she only sleeps 3 hours in a 24-hour period.  She denies any abnormal behavior and sleep.  Her future mother-in-law has sleep apnea and uses CPAP.  ESS 12.   Plan :  We discussed about the diagnosis of Obstructive Sleep Apnea (OSA) and implications of untreated OSA. We discussed about CPAP and BiPaP as possible treatment options.    We will schedule the patient for a sleep study.  She prefers a home sleep test. I was reluctant to do a sleep study on her since she claims she only sleeps 3 hours per night. She insisted and she said she will try to sleep at least 4-5 hours when the test is done. If the test is negative, need to determine whether she'll need oxygen because of her COPD. I discussed with her CPAP and she said she anticipates no issue with it. Her future mother-in-law has sleep apnea and she uses CPAP. Patient takes her Percocet 3 times a day.   Patient was instructed to call the office if he/she has not heard back from the office 1-2 weeks after the sleep study.   Patient was instructed to call the office if he/she is having issues with the PAP device.   We discussed good sleep hygiene.   Patient was advised not to engage in activities requiring concentration and/or vigilance if he/she is sleepy.  Patient was advised not to drive if he/she is sleepy.

## 2016-05-11 NOTE — Assessment & Plan Note (Signed)
Smoking cessation done.  

## 2016-05-11 NOTE — Patient Instructions (Signed)
It was a pleasure taking care of you today!  We will schedule you to have a sleep study to determine if you have sleep apnea.   We will get a home sleep test.  You will be instructed to come back to the office to get an apparatus to sleep with overnight.  Once we have the apparatus, it will usually take [name] 1-2 weeks to read the study and get back at you with results of the test.  Please give [name] a call in 2 weeks after your study if you do not hear back from [name].    If the sleep study is positive, we will order you a CPAP  machine.  Please call the office if you do NOT receive your machine in the next 1-2 weeks.   Please make sure you use your CPAP device everytime you sleep.  We will monitor the usage of your machine per your insurance requirement.  Your insurance company may take the machine from you if you are not using it regularly.   Please clean the mask, tubings, filter, water reservoir with soapy water every week.  Please use distilled water for the water reservoir.   Please call the office or your machine provider (DME company) if you are having issues with the device.   Continue using her Symbicort, 80/4.5, 2 puffs twice a day. I'll be role as needed.  Try to quit smoking.  Return to clinic in 8 weeks with Dr. Corrie Dandy or NP

## 2016-05-11 NOTE — Assessment & Plan Note (Signed)
patient has a 40PY smoking history, smokes 2PPD,  Has COPD, on alb neb 4-6x/day, not on o2. She was recently started on Symbicort, 80/4.5, 2 puffs twice a day.  PCP is taking care of this. I anticipate, she will need Spiriva respimat  on top of her Symbicort/  She is up-to-date with vaccines. Mentioned about influenza vaccine. Counseled her on smoking cessation.

## 2016-05-11 NOTE — Progress Notes (Signed)
Subjective:    Patient ID: Lynn Logan, female    DOB: 1971-08-04, 45 y.o.   MRN: [id number]  HPI   This is the case of Lynn Logan, 45 y.o. Female, who was referred by Evelina Dun in consultation regarding possible OSA.   As you very well know, patient has a 40PY smoking history, smokes 2PPD,  Has COPD, on alb neb 4-6x/day, not on o2. She was recently started on Symbicort, 80/4.5, 2 puffs twice a day.  Patient has mental health issues. She has depression, anxiety, schizophrenia, bipolar disorder. Also has insomnia. She is recently been through a lot of stress the last 3 months. She sees a mental health person for her mental health issues.  She has hypersomnia, snoring, witnessed apneas, gasping, choking. Hypersomnia affects her functionality. Given her recent mental health issues, her insomnia is worse and she claims she only sleeps 3 hours in a 24-hour period.  She denies any abnormal behavior and sleep.  Her future mother-in-law has sleep apnea and uses CPAP.   Review of Systems  Constitutional: Negative.  Negative for fever and unexpected weight change.  HENT: Positive for congestion, postnasal drip and rhinorrhea. Negative for dental problem, ear pain, nosebleeds, sinus pressure, sneezing, sore throat and trouble swallowing.   Eyes: Negative.  Negative for redness and itching.  Respiratory: Positive for cough, chest tightness, shortness of breath and wheezing.   Cardiovascular: Negative.  Negative for palpitations and leg swelling.  Gastrointestinal: Negative.  Negative for nausea and vomiting.  Endocrine: Negative.   Genitourinary: Negative.  Negative for dysuria.  Musculoskeletal: Positive for arthralgias and myalgias. Negative for joint swelling.  Skin: Negative.  Negative for rash.  Allergic/Immunologic: Positive for environmental allergies.  Neurological: Positive for dizziness, light-headedness and headaches.  Hematological: Bruises/bleeds easily.    Psychiatric/Behavioral: Negative.  Negative for dysphoric mood. The patient is not nervous/anxious.    Past Medical History:  Diagnosis Date  . Anxiety   . Arthritis    Rheumatoid  . Asthma   . CHF (congestive heart failure) ([place])   . Chronic back pain   . Depression   . Fibromyalgia   . Gout   . Right lumbar radiculopathy   . Vitiligo    face   (-) CA, DVT  Family History  Problem Relation Age of Onset  . Diabetes Mother   . Congestive Heart Failure Mother   . Depression Father   . Hypertension Father   . Cancer Father   . Heart disease Maternal Uncle      Past Surgical History:  Procedure Laterality Date  . CESAREAN SECTION    . DILITATION & CURRETTAGE/HYSTROSCOPY WITH NOVASURE ABLATION N/A 07/07/2015   Procedure: HYSTEROSCOPY, UTERINE CURETTAGE, ENDOMETRIAL  ABLATION Uterine Cavity Length=6.5cm Uterine Cavity Width=4.5cm Power=161 Watts Time=1 minute 19 seconds;  Surgeon: Florian Buff, MD;  Location: [facility];  Service: Gynecology;  Laterality: N/A;  . POLYPECTOMY  07/07/2015   Procedure: POLYPECTOMY;  Surgeon: Florian Buff, MD;  Location: [facility];  Service: Gynecology;;  . SALPINGOOPHORECTOMY Bilateral 10/13/2015   Procedure: BILATERAL SALPINGO OOPHORECTOMY;  Surgeon: Florian Buff, MD;  Location: [facility];  Service: Gynecology;  Laterality: Bilateral;  . TUBAL LIGATION    . VAGINAL HYSTERECTOMY N/A 10/13/2015   Procedure: HYSTERECTOMY VAGINAL;  Surgeon: Florian Buff, MD;  Location: [facility];  Service: Gynecology;  Laterality: N/A;    Social History   Social History  . Marital status: Divorced    Spouse name: N/A  .  Number of children: N/A  . Years of education: N/A   Occupational History  . Not on file.   Social History Main Topics  . Smoking status: Heavy Tobacco Smoker    Packs/day: 3.00    Years: 30.00    Types: Cigarettes  . Smokeless tobacco: Former Systems developer  . Alcohol use No  . Drug use: No     Comment: Hx of marijuana use - None now  . Sexual activity:  Not Currently    Birth control/ protection: Surgical   Other Topics Concern  . Not on file   Social History Narrative  . 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 (-) ETOH.   Allergies  Allergen Reactions  . Toradol [Ketorolac Tromethamine] Itching  . Buspirone Nausea And Vomiting  . Lyrica [Pregabalin] Nausea And Vomiting  . Gabapentin Nausea And Vomiting  . Hydrocodone     Causes nausea and vomitting  . Tramadol Nausea And Vomiting  . Zofran [Ondansetron Hcl] Nausea And Vomiting  . Penicillins Rash    Has patient had a PCN reaction causing immediate rash, facial/tongue/throat swelling, SOB or lightheadedness with hypotension: No Has patient had a PCN reaction causing severe rash involving mucus membranes or skin necrosis: No Has patient had a PCN reaction that required hospitalization No Has patient had a PCN reaction occurring within the last 10 years: yes If all of the above answers are "NO", then may proceed with Cephalosporin use.      Outpatient Medications Prior to Visit  Medication Sig Dispense Refill  . albuterol (PROVENTIL) (2.5 MG/3ML) 0.083% nebulizer solution Take 3 mLs (2.5 mg total) by nebulization every 6 (six) hours as needed for wheezing or shortness of breath. 150 mL 1  . diclofenac sodium (VOLTAREN) 1 % GEL Apply 2 g topically 4 (four) times daily. 100 g 2  . DULoxetine (CYMBALTA) 60 MG capsule TAKE ONE CAPSULE BY MOUTH ONCE DAILY 30 capsule 5  . estradiol (ESTRACE) 2 MG tablet Take 1 tablet (2 mg total) by mouth daily. 30 tablet 11  . ferrous sulfate 324 (65 Fe) MG TBEC Take 1 tablet (325 mg total) by mouth daily. 30 tablet 3  . gabapentin (NEURONTIN) 300 MG capsule Take 1 capsule (300 mg total) by mouth 3 (three) times daily. 90 capsule 3  . HYDROcodone-acetaminophen (NORCO/VICODIN) 5-325 MG tablet Take 1 tablet by mouth every 4 (four) hours as needed for moderate pain (Must last 30 days.  Do not take and drive a car or use machinery.).  110 tablet 0  . LINZESS 145 MCG CAPS capsule Take 1 capsule (145 mcg total) by mouth daily. 30 capsule 1  . megestrol (MEGACE) 40 MG tablet Take 1 tablet (40 mg total) by mouth daily. 90 tablet 3  . omeprazole (PRILOSEC) 20 MG capsule Take 1 capsule (20 mg total) by mouth daily. 1 tablet a day 90 capsule 0  . polyethylene glycol powder (GLYCOLAX/MIRALAX) powder Take 17 g by mouth 2 (two) times daily as needed. 3350 g 1  . potassium chloride SA (K-DUR,KLOR-CON) 20 MEQ tablet Take 2 tablets (40 mEq total) by mouth 2 (two) times daily. 28 tablet 0   No facility-administered medications prior to visit.    Meds ordered this encounter  Medications  . budesonide-formoterol (SYMBICORT) 160-4.5 MCG/ACT inhaler    Sig: Inhale 2 puffs into the lungs 2 (two) times daily.  . citalopram (CELEXA) 20 MG tablet    Sig: Take 1 tablet by  mouth daily.  . ergocalciferol (VITAMIN D2) 50000 units capsule    Sig: Take 1 capsule by mouth once a week.  . hydrOXYzine (ATARAX/VISTARIL) 25 MG tablet    Sig: Take 1 tablet by mouth at bedtime.  . methocarbamol (ROBAXIN) 500 MG tablet    Sig: Take 1 tablet by mouth 4 (four) times daily.  Marland Kitchen albuterol (PROVENTIL HFA;VENTOLIN HFA) 108 (90 Base) MCG/ACT inhaler    Sig: Inhale 2 puffs into the lungs every 6 (six) hours as needed.         Objective:   Physical Exam  Vitals:  Vitals:   05/11/16 1443  BP: 112/68  Pulse: 60  SpO2: 99%  Weight: 176 lb (79.8 kg)  Height: 5\' 3"  (1.6 m)    Constitutional/General:  Pleasant, well-nourished, well-developed, not in any distress,  Comfortably seating.  Well kempt  Body mass index is 31.18 kg/m. Wt Readings from Last 3 Encounters:  05/11/16 176 lb (79.8 kg)  04/26/16 179 lb (81.2 kg)  04/06/16 179 lb 12.8 oz (81.6 kg)    HEENT: Pupils equal and reactive to light and accommodation. Anicteric sclerae. Normal nasal mucosa.   No oral  lesions,  mouth clear,  oropharynx clear, no postnasal drip. (-) Oral thrush. No  dental caries.  Airway - Mallampati class III  Neck: No masses. Midline trachea. No JVD, (-) LAD. (-) bruits appreciated.  Respiratory/Chest: Grossly normal chest. (-) deformity. (-) Accessory muscle use.  Symmetric expansion. (-) Tenderness on palpation.  Resonant on percussion.  Diminished BS on both lower lung zones. (-) wheezing, crackles, rhonchi (-) egophony  Cardiovascular: Regular rate and  rhythm, heart sounds normal, no murmur or gallops, no peripheral edema  Gastrointestinal:  Normal bowel sounds. Soft, non-tender. No hepatosplenomegaly.  (-) masses.   Musculoskeletal:  Normal muscle tone. Normal gait.   Extremities: Grossly normal. (-) clubbing, cyanosis.  (-) edema  Skin: (-) rash,lesions seen.   Neurological/Psychiatric : alert, oriented to time, place, person. Normal mood and affect          Assessment & Plan:  Hypersomnia Patient has mental health issues. She has depression, anxiety, schizophrenia, bipolar disorder. Also has insomnia. She is recently been through a lot of stress the last 3 months. She sees a mental health person for her mental health issues.  She has hypersomnia, snoring, witnessed apneas, gasping, choking. Hypersomnia affects her functionality. Given her recent mental health issues, her insomnia is worse and she claims she only sleeps 3 hours in a 24-hour period.  She denies any abnormal behavior and sleep.  Her future mother-in-law has sleep apnea and uses CPAP.  ESS 12.   Plan :  We discussed about the diagnosis of Obstructive Sleep Apnea (OSA) and implications of untreated OSA. We discussed about CPAP and BiPaP as possible treatment options.    We will schedule the patient for a sleep study.  She prefers a home sleep test. I was reluctant to do a sleep study on her since she claims she only sleeps 3 hours per night. She insisted and she said she will try to sleep at least 4-5 hours when the test is done. If the test is negative,  need to determine whether she'll need oxygen because of her COPD. I discussed with her CPAP and she said she anticipates no issue with it. Her future mother-in-law has sleep apnea and she uses CPAP. Patient takes her Percocet 3 times a day.   Patient was instructed to call the office if he/she  has not heard back from the office 1-2 weeks after the sleep study.   Patient was instructed to call the office if he/she is having issues with the PAP device.   We discussed good sleep hygiene.   Patient was advised not to engage in activities requiring concentration and/or vigilance if he/she is sleepy.  Patient was advised not to drive if he/she is sleepy.    COPD (chronic obstructive pulmonary disease) ([place]) patient has a 40PY smoking history, smokes 2PPD,  Has COPD, on alb neb 4-6x/day, not on o2. She was recently started on Symbicort, 80/4.5, 2 puffs twice a day.  PCP is taking care of this. I anticipate, she will need Spiriva respimat  on top of her Symbicort/  She is up-to-date with vaccines. Mentioned about influenza vaccine. Counseled her on smoking cessation.  Tobacco user Smoking cessation done.    Thank you very much for letting me participate in this patient's care. Please do not hesitate to give me a call if you have any questions or concerns regarding the treatment plan.   Patient will follow up with me in 8 weeks.     Monica Becton, MD 05/11/2016   3:24 PM [facility] [place] Pager: [phone] Office: [phone], Fax: [phone]

## 2016-05-17 ENCOUNTER — Ambulatory Visit: Payer: Medicare Other | Admitting: Obstetrics & Gynecology

## 2016-05-18 DIAGNOSIS — I5030 Unspecified diastolic (congestive) heart failure: Secondary | ICD-10-CM | POA: Diagnosis not present

## 2016-05-23 ENCOUNTER — Ambulatory Visit (INDEPENDENT_AMBULATORY_CARE_PROVIDER_SITE_OTHER): Payer: Medicare Other | Admitting: Obstetrics & Gynecology

## 2016-05-23 ENCOUNTER — Encounter: Payer: Self-pay | Admitting: Obstetrics & Gynecology

## 2016-05-23 VITALS — BP 126/70 | HR 78 | Ht 62.0 in | Wt 180.0 lb

## 2016-05-23 DIAGNOSIS — M940 Chondrocostal junction syndrome [Tietze]: Secondary | ICD-10-CM

## 2016-05-23 DIAGNOSIS — N644 Mastodynia: Secondary | ICD-10-CM | POA: Diagnosis not present

## 2016-05-23 MED ORDER — PIROXICAM 20 MG PO CAPS: 20.0000 mg | ORAL_CAPSULE | Freq: Every day | ORAL | 2 refills | Status: DC

## 2016-05-23 NOTE — Progress Notes (Signed)
      Chief Complaint  Patient presents with  . breast check    sore and tingling, like bee string    Blood pressure 126/70, pulse 78, height 5\' 2"  (1.575 m), weight 180 lb (81.6 kg), last menstrual period 09/12/2015.  45 y.o. No obstetric history on file. Patient's last menstrual period was 09/12/2015 (exact date). The current method of family planning is status post hysterectomy.  Subjective Pt with 1 month history of burning sensation of her breasts R>L No fever or masses  Objective No breast masses breast non tender +pain in chest wall along the area of the cartilage/rib interface multiple areas  Pertinent ROS Mammogram in June was normal  Labs or studies     Impression Diagnoses this Encounter::   ICD-9-CM ICD-10-CM   1. Costochondritis 733.6 M94.0     Established relevant diagnosis(es):   Plan/Recommendations: Meds ordered this encounter  Medications  . piroxicam (FELDENE) 20 MG capsule    Sig: Take 1 capsule (20 mg total) by mouth daily.    Dispense:  30 capsule    Refill:  2    Labs or Scans Ordered: No orders of the defined types were placed in this encounter.   Management:: Re evl 6 weeks  Follow up Return in about 6 weeks (around 07/04/2016) for Follow up, with Dr Elonda Husky.          All questions were answered.

## 2016-05-24 ENCOUNTER — Ambulatory Visit: Payer: Medicare Other | Admitting: Orthopaedic Surgery

## 2016-05-25 ENCOUNTER — Encounter: Payer: Self-pay | Admitting: Orthopaedic Surgery

## 2016-05-25 ENCOUNTER — Ambulatory Visit (INDEPENDENT_AMBULATORY_CARE_PROVIDER_SITE_OTHER): Payer: Medicare Other | Admitting: Orthopaedic Surgery

## 2016-05-25 VITALS — BP 124/80 | HR 74 | Temp 98.1°F | Ht 62.0 in | Wt 181.0 lb

## 2016-05-25 DIAGNOSIS — F17210 Nicotine dependence, cigarettes, uncomplicated: Secondary | ICD-10-CM

## 2016-05-25 DIAGNOSIS — M25561 Pain in right knee: Secondary | ICD-10-CM

## 2016-05-25 DIAGNOSIS — M25562 Pain in left knee: Secondary | ICD-10-CM | POA: Diagnosis not present

## 2016-05-25 NOTE — Progress Notes (Signed)
CC: Both of my knees are hurting. I would like an injection in both knees.  The patient has had chronic pain and tenderness of both knees for some time.  Injections help.  There is no locking or giving way of the knee.  There is no new trauma. There is no redness or signs of infections.  The knees have a mild effusion and some crepitus.  There is no redness or signs of recent trauma.  Right knee ROM is 0-105 and left knee ROM is 0-105.  Impression:  Chronic pain of the both knees  Return:  1 month  PROCEDURE NOTE:  The patient requests injections of both knees, verbal consent was obtained.  The left and right knee were individually prepped appropriately after time out was performed.   Sterile technique was observed and injection of 1 cc of Depo-Medrol 40 mg with several cc's of plain xylocaine. Anesthesia was provided by ethyl chloride and a 20-gauge needle was used to inject each knee area. The injections were tolerated well.  A band aid dressing was applied.  The patient was advised to apply ice later today and tomorrow to the injection sight as needed.  Electronically Signed Sanjuana Kava, MD 9/14/20172:09 PM

## 2016-05-25 NOTE — Patient Instructions (Signed)
Smoking Cessation, Tips for Success If you are ready to quit smoking, congratulations! You have chosen to help yourself be healthier. Cigarettes bring nicotine, tar, carbon monoxide, and other irritants into your body. Your lungs, heart, and blood vessels will be able to work better without these poisons. There are many different ways to quit smoking. Nicotine gum, nicotine patches, a nicotine inhaler, or nicotine nasal spray can help with physical craving. Hypnosis, support groups, and medicines help break the habit of smoking. WHAT THINGS CAN I DO TO MAKE QUITTING EASIER?  Here are some tips to help you quit for good:  Pick a date when you will quit smoking completely. Tell all of your friends and family about your plan to quit on that date.  Do not try to slowly cut down on the number of cigarettes you are smoking. Pick a quit date and quit smoking completely starting on that day.  Throw away all cigarettes.   Clean and remove all ashtrays from your home, work, and car.  On a card, write down your reasons for quitting. Carry the card with you and read it when you get the urge to smoke.  Cleanse your body of nicotine. Drink enough water and fluids to keep your urine clear or pale yellow. Do this after quitting to flush the nicotine from your body.  Learn to predict your moods. Do not let a bad situation be your excuse to have a cigarette. Some situations in your life might tempt you into wanting a cigarette.  Never have "just one" cigarette. It leads to wanting another and another. Remind yourself of your decision to quit.  Change habits associated with smoking. If you smoked while driving or when feeling stressed, try other activities to replace smoking. Stand up when drinking your coffee. Brush your teeth after eating. Sit in a different chair when you read the paper. Avoid alcohol while trying to quit, and try to drink fewer caffeinated beverages. Alcohol and caffeine may urge you to  smoke.  Avoid foods and drinks that can trigger a desire to smoke, such as sugary or spicy foods and alcohol.  Ask people who smoke not to smoke around you.  Have something planned to do right after eating or having a cup of coffee. For example, plan to take a walk or exercise.  Try a relaxation exercise to calm you down and decrease your stress. Remember, you may be tense and nervous for the first 2 weeks after you quit, but this will pass.  Find new activities to keep your hands busy. Play with a pen, coin, or rubber band. Doodle or draw things on paper.  Brush your teeth right after eating. This will help cut down on the craving for the taste of tobacco after meals. You can also try mouthwash.   Use oral substitutes in place of cigarettes. Try using lemon drops, carrots, cinnamon sticks, or chewing gum. Keep them handy so they are available when you have the urge to smoke.  When you have the urge to smoke, try deep breathing.  Designate your home as a nonsmoking area.  If you are a heavy smoker, ask your health care provider about a prescription for nicotine chewing gum. It can ease your withdrawal from nicotine.  Reward yourself. Set aside the cigarette money you save and buy yourself something nice.  Look for support from others. Join a support group or smoking cessation program. Ask someone at home or at work to help you with your plan   to quit smoking.  Always ask yourself, "Do I need this cigarette or is this just a reflex?" Tell yourself, "Today, I choose not to smoke," or "I do not want to smoke." You are reminding yourself of your decision to quit.  Do not replace cigarette smoking with electronic cigarettes (commonly called e-cigarettes). The safety of e-cigarettes is unknown, and some may contain harmful chemicals.  If you relapse, do not give up! Plan ahead and think about what you will do the next time you get the urge to smoke. HOW WILL I FEEL WHEN I QUIT SMOKING? You  may have symptoms of withdrawal because your body is used to nicotine (the addictive substance in cigarettes). You may crave cigarettes, be irritable, feel very hungry, cough often, get headaches, or have difficulty concentrating. The withdrawal symptoms are only temporary. They are strongest when you first quit but will go away within 10-14 days. When withdrawal symptoms occur, stay in control. Think about your reasons for quitting. Remind yourself that these are signs that your body is healing and getting used to being without cigarettes. Remember that withdrawal symptoms are easier to treat than the major diseases that smoking can cause.  Even after the withdrawal is over, expect periodic urges to smoke. However, these cravings are generally short lived and will go away whether you smoke or not. Do not smoke! WHAT RESOURCES ARE AVAILABLE TO HELP ME QUIT SMOKING? Your health care provider can direct you to community resources or hospitals for support, which may include:  Group support.  Education.  Hypnosis.  Therapy.   This information is not intended to replace advice given to you by your health care provider. Make sure you discuss any questions you have with your health care provider.   Document Released: 05/26/2004 Document Revised: 09/18/2014 Document Reviewed: 02/13/2013 Elsevier Interactive Patient Education 2016 [employer]  

## 2016-05-26 DIAGNOSIS — J45909 Unspecified asthma, uncomplicated: Secondary | ICD-10-CM | POA: Diagnosis not present

## 2016-05-31 ENCOUNTER — Encounter: Payer: Self-pay | Admitting: Cardiovascular Disease

## 2016-05-31 ENCOUNTER — Telehealth: Payer: Self-pay | Admitting: Orthopaedic Surgery

## 2016-05-31 MED ORDER — HYDROCODONE-ACETAMINOPHEN 5-325 MG PO TABS: 1.0000 | ORAL_TABLET | Freq: Four times a day (QID) | ORAL | 0 refills | Status: DC | PRN

## 2016-05-31 NOTE — Telephone Encounter (Signed)
Hydrocodone-Acetaminophen 5/325mg   Qty 110 Tablets  Patient has [employer] & Medicaid

## 2016-06-05 ENCOUNTER — Telehealth: Payer: Self-pay | Admitting: Pulmonary Disease

## 2016-06-05 NOTE — Telephone Encounter (Signed)
lmtcb x1 for pt. 

## 2016-06-07 ENCOUNTER — Telehealth: Payer: Self-pay | Admitting: Pulmonary Disease

## 2016-06-07 DIAGNOSIS — G4710 Hypersomnia, unspecified: Secondary | ICD-10-CM

## 2016-06-07 NOTE — Telephone Encounter (Signed)
LMTCB x 1 

## 2016-06-08 NOTE — Telephone Encounter (Signed)
Order has been placed and pt is aware. Nothing further needed 

## 2016-06-08 NOTE — Telephone Encounter (Signed)
Called spoke with pt. She states she was suppose to have a HST but would like the order changed due to transportation issues. She states she would rather do the sleep study at [facility]. I explained to her that I would send a message to AD for his approval. She voiced understanding and had no further questions.   AD please advise

## 2016-06-08 NOTE — Telephone Encounter (Signed)
Ok to do sleep study at [facility]. Pls order a split night sleep study and pls tell lab to send the study to me so I can read the study. Thanks.   Monica Becton, MD 06/08/2016, 9:23 AM [facility] Pager [phone] After 3 pm or if no answer, call [phone]

## 2016-06-12 DIAGNOSIS — M542 Cervicalgia: Secondary | ICD-10-CM | POA: Diagnosis not present

## 2016-06-12 DIAGNOSIS — G8929 Other chronic pain: Secondary | ICD-10-CM | POA: Diagnosis not present

## 2016-06-12 DIAGNOSIS — D649 Anemia, unspecified: Secondary | ICD-10-CM | POA: Diagnosis not present

## 2016-06-12 DIAGNOSIS — E559 Vitamin D deficiency, unspecified: Secondary | ICD-10-CM | POA: Diagnosis not present

## 2016-06-12 DIAGNOSIS — Z23 Encounter for immunization: Secondary | ICD-10-CM | POA: Diagnosis not present

## 2016-06-12 DIAGNOSIS — E784 Other hyperlipidemia: Secondary | ICD-10-CM | POA: Diagnosis not present

## 2016-06-13 ENCOUNTER — Ambulatory Visit: Payer: Medicare Other | Attending: Pulmonary Disease | Admitting: Pulmonary Disease

## 2016-06-13 DIAGNOSIS — G2581 Restless legs syndrome: Secondary | ICD-10-CM | POA: Insufficient documentation

## 2016-06-13 DIAGNOSIS — G4710 Hypersomnia, unspecified: Secondary | ICD-10-CM | POA: Diagnosis not present

## 2016-06-14 ENCOUNTER — Ambulatory Visit: Payer: Medicare Other | Admitting: Cardiovascular Disease
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
## 2016-06-15 ENCOUNTER — Telehealth: Payer: Self-pay | Admitting: Pulmonary Disease

## 2016-06-15 NOTE — Procedures (Signed)
    NAME: Lynn Logan DATE OF BIRTH:  30-Sep-1970 MEDICAL RECORD NUMBER [id number]  LOCATION: [facility]  PHYSICIAN: Bailey  DATE OF STUDY: 06/13/2016   CLINICAL INFORMATION  Sleep Study Type: NPSG  Indication for sleep study: N/A  Epworth Sleepiness Score: 3   SLEEP STUDY TECHNIQUE  As per the AASM Manual for the Scoring of Sleep and Associated Events v2.3 (April 2016) with a hypopnea requiring 4% desaturations.  The channels recorded and monitored were frontal, central and occipital EEG, electrooculogram (EOG), submentalis EMG (chin), nasal and oral airflow, thoracic and abdominal wall motion, anterior tibialis EMG, snore microphone, electrocardiogram, and pulse oximetry.   MEDICATIONS  Patient's medications include: N/A. Medications reviewed per chart review.  Medications self-administered by patient during sleep study : No sleep medicine administered.  SLEEP ARCHITECTURE  The study was initiated at 10:52:13 PM and ended at 4:58:21 AM.  Sleep onset time was 27.8 minutes and the sleep efficiency was 89.1%. The total sleep time was 326.4 minutes.  Stage REM latency was 60.0 minutes.  The patient spent 3.68% of the night in stage N1 sleep, 48.37% in stage N2 sleep, 28.65% in stage N3 and 19.30% in REM.  Alpha intrusion was absent.  Supine sleep was 13.44%.   RESPIRATORY PARAMETERS  The overall apnea/hypopnea index (AHI) was 0.0 per hour. There were 0 total apneas, including 0 obstructive, 0 central and 0 mixed apneas. There were 0 hypopneas and 33 RERAs.  The AHI during Stage REM sleep was 0.0 per hour. AHI while supine was 0.0 per hour.  The mean oxygen saturation was 93.36%. The minimum SpO2 during sleep was 0.00%.  Moderate snoring was noted during this study.  CARDIAC DATA  The 2 lead EKG demonstrated sinus rhythm. The mean heart rate was N/A beats per minute. Other EKG findings include: PVCs.   LEG MOVEMENT DATA  The total PLMS  were 363 with a resulting PLMS index of 66.73. Associated arousal with leg movement index was 7.2 .  IMPRESSIONS  1. No significant obstructive sleep apnea occurred during this study (AHI = 0.0/h). 2. No significant central sleep apnea occurred during this study (CAI = 0.0/h) 3. The patient snored with Moderate snoring volume. 4. EKG findings include PVCs. 5. The periodic limb movements of sleep index was elevated during the study. Associated arousals were significant.  DIAGNOSIS  1. Elevated PLMS index. Clinical correlation regarding restless leg syndrome. 2. No evidence for significant sleep apnea based on this study.   RECOMMENDATIONS  1. Clinical correlation regarding restless leg syndrome. Patient has elevated periodic limb movement index. 2. No evidence for significant sleep apnea based on this study. If hypersomnia is persistent, suggest getting home sleep test. Patient mentioned she anticipated it would be difficult sleeping in the lab. 3. Avoid alcohol, sedatives and other CNS depressants that may worsen sleep apnea and disrupt normal sleep architecture. 4. Sleep hygiene should be reviewed to assess factors that may improve sleep quality. 5. Weight management and regular exercise should be initiated or continued if appropriate.  6. Follow-up in the office as scheduled.  Monica Becton, MD 06/15/2016, 2:29 PM [facility] Pager [phone] After 3 pm or if no answer, call [phone]

## 2016-06-15 NOTE — Telephone Encounter (Addendum)
   Sheena : Please tell her the LAB sleep study was negative for sleep apnea. Her legs were moving frequently. I'm not sure if she wants to try medicine for possible restless leg syndrome. We can discuss it when she follows up on October 26. I can also see her next week since I have opening next week.  Thanks.  Monica Becton, MD 06/15/2016, 2:32 PM [place] [facility] Pager [phone] After 3 pm or if no answer, call [phone]

## 2016-06-16 DIAGNOSIS — I509 Heart failure, unspecified: Secondary | ICD-10-CM | POA: Diagnosis not present

## 2016-06-16 DIAGNOSIS — Z79899 Other long term (current) drug therapy: Secondary | ICD-10-CM | POA: Diagnosis not present

## 2016-06-16 DIAGNOSIS — S99912A Unspecified injury of left ankle, initial encounter: Secondary | ICD-10-CM | POA: Diagnosis not present

## 2016-06-16 DIAGNOSIS — J45909 Unspecified asthma, uncomplicated: Secondary | ICD-10-CM | POA: Diagnosis not present

## 2016-06-16 DIAGNOSIS — I252 Old myocardial infarction: Secondary | ICD-10-CM | POA: Diagnosis not present

## 2016-06-16 DIAGNOSIS — M179 Osteoarthritis of knee, unspecified: Secondary | ICD-10-CM | POA: Diagnosis not present

## 2016-06-16 DIAGNOSIS — M1712 Unilateral primary osteoarthritis, left knee: Secondary | ICD-10-CM | POA: Diagnosis not present

## 2016-06-16 DIAGNOSIS — M25572 Pain in left ankle and joints of left foot: Secondary | ICD-10-CM | POA: Diagnosis not present

## 2016-06-17 DIAGNOSIS — I5030 Unspecified diastolic (congestive) heart failure: Secondary | ICD-10-CM | POA: Diagnosis not present

## 2016-06-20 NOTE — Telephone Encounter (Signed)
ATC, phone recording states that the phone is not accepting calls at this time. WCB

## 2016-06-22 ENCOUNTER — Encounter: Payer: Self-pay | Admitting: Orthopaedic Surgery

## 2016-06-22 ENCOUNTER — Ambulatory Visit (INDEPENDENT_AMBULATORY_CARE_PROVIDER_SITE_OTHER): Payer: Medicare Other | Admitting: Orthopaedic Surgery

## 2016-06-22 VITALS — BP 111/69 | HR 58 | Temp 97.5°F | Resp 18 | Ht 63.0 in | Wt 178.0 lb

## 2016-06-22 DIAGNOSIS — M25561 Pain in right knee: Secondary | ICD-10-CM | POA: Diagnosis not present

## 2016-06-22 DIAGNOSIS — G8929 Other chronic pain: Secondary | ICD-10-CM | POA: Diagnosis not present

## 2016-06-22 DIAGNOSIS — F17210 Nicotine dependence, cigarettes, uncomplicated: Secondary | ICD-10-CM | POA: Diagnosis not present

## 2016-06-22 DIAGNOSIS — M25562 Pain in left knee: Secondary | ICD-10-CM | POA: Diagnosis not present

## 2016-06-22 DIAGNOSIS — G6289 Other specified polyneuropathies: Secondary | ICD-10-CM

## 2016-06-22 NOTE — Progress Notes (Signed)
CC: Both of my knees are hurting. I would like an injection in both knees.  The patient has had chronic pain and tenderness of both knees for some time.  Injections help.  There is no locking or giving way of the knee.  There is no new trauma. There is no redness or signs of infections.  The knees have a mild effusion and some crepitus.  There is no redness or signs of recent trauma.  Right knee ROM is 0-100 and left knee ROM is 0-105.  Impression:  Chronic pain of the both knees  Return:  1 month  PROCEDURE NOTE:  The patient requests injections of both knees, verbal consent was obtained.  The left and right knee were individually prepped appropriately after time out was performed.   Sterile technique was observed and injection of 1 cc of Depo-Medrol 40 mg with several cc's of plain xylocaine. Anesthesia was provided by ethyl chloride and a 20-gauge needle was used to inject each knee area. The injections were tolerated well.  A band aid dressing was applied.  The patient was advised to apply ice later today and tomorrow to the injection sight as needed.   Electronically Signed Sanjuana Kava, MD 10/12/20172:37 PM

## 2016-06-22 NOTE — Telephone Encounter (Signed)
Phone recording states that this phone is not accepting calls at this time. Letter mailed to home address.

## 2016-06-25 DIAGNOSIS — J45909 Unspecified asthma, uncomplicated: Secondary | ICD-10-CM | POA: Diagnosis not present

## 2016-06-26 ENCOUNTER — Encounter: Payer: Self-pay | Admitting: Cardiovascular Disease

## 2016-06-29 ENCOUNTER — Telehealth: Payer: Self-pay | Admitting: Pulmonary Disease

## 2016-06-29 ENCOUNTER — Telehealth: Payer: Self-pay | Admitting: Orthopaedic Surgery

## 2016-06-29 MED ORDER — HYDROCODONE-ACETAMINOPHEN 5-325 MG PO TABS: 1.0000 | ORAL_TABLET | Freq: Four times a day (QID) | ORAL | 0 refills | Status: DC | PRN

## 2016-06-29 NOTE — Telephone Encounter (Signed)
Patient called for refill:  HYDROcodone-acetaminophen (NORCO/VICODIN) 5-325 MG tablet   - insurance is [facility] Medicare + Medicaid

## 2016-06-29 NOTE — Telephone Encounter (Signed)
Pt aware of sleep study results per 06-15-16 phone note. Pt voiced understanding and had no further questions. Nothing further needed.

## 2016-07-04 ENCOUNTER — Encounter: Payer: Self-pay | Admitting: Obstetrics & Gynecology

## 2016-07-04 ENCOUNTER — Ambulatory Visit (INDEPENDENT_AMBULATORY_CARE_PROVIDER_SITE_OTHER): Payer: Medicare Other | Admitting: Obstetrics & Gynecology

## 2016-07-04 VITALS — BP 100/70 | HR 76 | Wt 180.0 lb

## 2016-07-04 DIAGNOSIS — M940 Chondrocostal junction syndrome [Tietze]: Secondary | ICD-10-CM

## 2016-07-04 DIAGNOSIS — Z90711 Acquired absence of uterus with remaining cervical stump: Secondary | ICD-10-CM | POA: Diagnosis not present

## 2016-07-04 NOTE — Progress Notes (Signed)
Chief Complaint  Patient presents with  . Follow-up    costochondritis    Blood pressure 100/70, pulse 76, weight 180 lb (81.6 kg), last menstrual period 09/12/2015.  45 y.o. No obstetric history on file. Patient's last menstrual period was 09/12/2015 (exact date). The current method of family planning is status post hysterectomy.  Outpatient Encounter Prescriptions as of 07/04/2016  Medication Sig Note  . albuterol (PROVENTIL HFA;VENTOLIN HFA) 108 (90 Base) MCG/ACT inhaler Inhale 2 puffs into the lungs every 6 (six) hours as needed. 05/11/2016: Received from: [facility]: Inhale two puffs into the lungs every 6 (six) hours as needed for Wheezing.  Marland Kitchen albuterol (PROVENTIL) (2.5 MG/3ML) 0.083% nebulizer solution Take 3 mLs (2.5 mg total) by nebulization every 6 (six) hours as needed for wheezing or shortness of breath.   . budesonide-formoterol (SYMBICORT) 160-4.5 MCG/ACT inhaler Inhale 2 puffs into the lungs 2 (two) times daily. 05/11/2016: Received from: [facility]: Inhale two puffs into the lungs 2 (two) times daily.  . citalopram (CELEXA) 20 MG tablet Take 1 tablet by mouth daily. 05/11/2016: Received from: [facility]: Take one tablet (20 mg total) by mouth daily. Take half tab daily x 2 weeks then increase to whole tab daily  . diclofenac sodium (VOLTAREN) 1 % GEL Apply 2 g topically 4 (four) times daily.   . DULoxetine (CYMBALTA) 60 MG capsule TAKE ONE CAPSULE BY MOUTH ONCE DAILY   . ergocalciferol (VITAMIN D2) 50000 units capsule Take 1 capsule by mouth once a week. 05/11/2016: Received from: [facility]: Take one capsule (50,000 Units total) by mouth once a week.  . estradiol (ESTRACE) 2 MG tablet Take 1 tablet (2 mg total) by mouth daily.   . ferrous sulfate 324 (65 Fe) MG TBEC Take 1 tablet (325 mg total) by mouth daily.   Marland Kitchen HYDROcodone-acetaminophen (NORCO/VICODIN) 5-325 MG tablet Take 1 tablet by mouth every 6 (six)  hours as needed for moderate pain (Must last 30 days.Do not take and drive a car or use machinery.).   Marland Kitchen hydrOXYzine (ATARAX/VISTARIL) 25 MG tablet Take 1 tablet by mouth at bedtime. 05/11/2016: Received from: [facility]: Take 50 mg by mouth at bedtime.  . megestrol (MEGACE) 40 MG tablet Take 1 tablet (40 mg total) by mouth daily.   Marland Kitchen omeprazole (PRILOSEC) 20 MG capsule Take 1 capsule (20 mg total) by mouth daily. 1 tablet a day   . piroxicam (FELDENE) 20 MG capsule Take 1 capsule (20 mg total) by mouth daily.   . polyethylene glycol powder (GLYCOLAX/MIRALAX) powder Take 17 g by mouth 2 (two) times daily as needed.   . potassium chloride SA (K-DUR,KLOR-CON) 20 MEQ tablet Take 2 tablets (40 mEq total) by mouth 2 (two) times daily.    No facility-administered encounter medications on file as of 07/04/2016.     Subjective Pt states her costochondritis is "somewhat" better, sometimes takes 2 fedene a day I told her not to take 2 a day Also sleep study showed restless legs syndrome  Objective Tenderness along costochondral border is better moving her breasts does not change where the tenderness is So confirmed chest wall pain  Pertinent ROS   Labs or studies     Impression Diagnoses this Encounter::   ICD-9-CM ICD-10-CM   1. Costochondritis 733.6 M94.0     Established relevant diagnosis(es):   Plan/Recommendations: No orders of the defined types were placed in this encounter.  Labs or Scans Ordered: No orders of the defined types were placed in this encounter.   Management:: Continue feldene  Follow up Return if symptoms worsen or fail to improve.        Face to face time:  15 minutes  Greater than 50% of the visit time was spent in counseling and coordination of care with the patient.  The summary and outline of the counseling and care coordination is summarized in the note above.   All questions were answered.  Past Medical History:    Diagnosis Date  . Anxiety   . Arthritis    Rheumatoid  . Asthma   . CHF (congestive heart failure) ([place])   . Chronic back pain   . Depression   . Fibromyalgia   . Gout   . Right lumbar radiculopathy   . Vitiligo    face    Past Surgical History:  Procedure Laterality Date  . CESAREAN SECTION    . DILITATION & CURRETTAGE/HYSTROSCOPY WITH NOVASURE ABLATION N/A 07/07/2015   Procedure: HYSTEROSCOPY, UTERINE CURETTAGE, ENDOMETRIAL  ABLATION Uterine Cavity Length=6.5cm Uterine Cavity Width=4.5cm Power=161 Watts Time=1 minute 19 seconds;  Surgeon: Florian Buff, MD;  Location: [facility];  Service: Gynecology;  Laterality: N/A;  . POLYPECTOMY  07/07/2015   Procedure: POLYPECTOMY;  Surgeon: Florian Buff, MD;  Location: [facility];  Service: Gynecology;;  . SALPINGOOPHORECTOMY Bilateral 10/13/2015   Procedure: BILATERAL SALPINGO OOPHORECTOMY;  Surgeon: Florian Buff, MD;  Location: [facility];  Service: Gynecology;  Laterality: Bilateral;  . TUBAL LIGATION    . VAGINAL HYSTERECTOMY N/A 10/13/2015   Procedure: HYSTERECTOMY VAGINAL;  Surgeon: Florian Buff, MD;  Location: [facility];  Service: Gynecology;  Laterality: N/A;    OB History    No data available      Allergies  Allergen Reactions  . Toradol [Ketorolac Tromethamine] Itching  . Buspirone Nausea And Vomiting  . Lyrica [Pregabalin] Nausea And Vomiting  . Gabapentin Nausea And Vomiting  . Tramadol Nausea And Vomiting  . Zofran [Ondansetron Hcl] Nausea And Vomiting  . Penicillins Rash    Has patient had a PCN reaction causing immediate rash, facial/tongue/throat swelling, SOB or lightheadedness with hypotension: No Has patient had a PCN reaction causing severe rash involving mucus membranes or skin necrosis: No Has patient had a PCN reaction that required hospitalization No Has patient had a PCN reaction occurring within the last 10 years: yes If all of the above answers are "NO", then may proceed with Cephalosporin use.     Social  History   Social History  . Marital status: Divorced    Spouse name: N/A  . Number of children: N/A  . Years of education: N/A   Social History Main Topics  . Smoking status: Heavy Tobacco Smoker    Packs/day: 3.00    Years: 30.00    Types: Cigarettes  . Smokeless tobacco: Former Systems developer  . Alcohol use No  . Drug use: No     Comment: Hx of marijuana use - None now  . Sexual activity: Not Currently    Birth control/ protection: Surgical   Other Topics Concern  . None   Social History Narrative  . None    Family History  Problem Relation Age of Onset  . Diabetes Mother   . Congestive Heart Failure Mother   . Depression Father   . Hypertension Father   . Cancer Father   . Heart disease Maternal Uncle

## 2016-07-06 ENCOUNTER — Ambulatory Visit: Payer: Medicare Other | Admitting: Pulmonary Disease

## 2016-07-18 DIAGNOSIS — I5030 Unspecified diastolic (congestive) heart failure: Secondary | ICD-10-CM | POA: Diagnosis not present

## 2016-07-22 DIAGNOSIS — I252 Old myocardial infarction: Secondary | ICD-10-CM | POA: Diagnosis not present

## 2016-07-22 DIAGNOSIS — J209 Acute bronchitis, unspecified: Secondary | ICD-10-CM | POA: Diagnosis not present

## 2016-07-22 DIAGNOSIS — R05 Cough: Secondary | ICD-10-CM | POA: Diagnosis not present

## 2016-07-22 DIAGNOSIS — J45909 Unspecified asthma, uncomplicated: Secondary | ICD-10-CM | POA: Diagnosis not present

## 2016-07-22 DIAGNOSIS — I509 Heart failure, unspecified: Secondary | ICD-10-CM | POA: Diagnosis not present

## 2016-07-22 DIAGNOSIS — Z79899 Other long term (current) drug therapy: Secondary | ICD-10-CM | POA: Diagnosis not present

## 2016-07-22 DIAGNOSIS — Z720 Tobacco use: Secondary | ICD-10-CM | POA: Diagnosis not present

## 2016-07-25 ENCOUNTER — Ambulatory Visit: Payer: Medicare Other | Admitting: Orthopaedic Surgery

## 2016-07-26 DIAGNOSIS — J45909 Unspecified asthma, uncomplicated: Secondary | ICD-10-CM | POA: Diagnosis not present

## 2016-08-01 ENCOUNTER — Encounter: Payer: Self-pay | Admitting: Orthopaedic Surgery

## 2016-08-01 ENCOUNTER — Other Ambulatory Visit: Payer: Medicare Other | Admitting: Obstetrics & Gynecology

## 2016-08-01 ENCOUNTER — Ambulatory Visit (INDEPENDENT_AMBULATORY_CARE_PROVIDER_SITE_OTHER): Payer: Medicare Other | Admitting: Orthopaedic Surgery

## 2016-08-01 DIAGNOSIS — M25562 Pain in left knee: Secondary | ICD-10-CM

## 2016-08-01 DIAGNOSIS — G8929 Other chronic pain: Secondary | ICD-10-CM

## 2016-08-01 DIAGNOSIS — M25561 Pain in right knee: Secondary | ICD-10-CM

## 2016-08-01 MED ORDER — HYDROCODONE-ACETAMINOPHEN 5-325 MG PO TABS: 1.0000 | ORAL_TABLET | Freq: Four times a day (QID) | ORAL | 0 refills | Status: DC | PRN

## 2016-08-01 NOTE — Progress Notes (Signed)
CC: Both of my knees are hurting. I would like an injection in both knees.  The patient has had chronic pain and tenderness of both knees for some time.  Injections help.  There is no locking or giving way of the knee.  There is no new trauma. There is no redness or signs of infections.  The knees have a mild effusion and some crepitus.  There is no redness or signs of recent trauma.  Right knee ROM is 0-105 and left knee ROM is 0-100.  Impression:  Chronic pain of the both knees  Return:  1 month  PROCEDURE NOTE:  The patient requests injections of both knees, verbal consent was obtained.  The left and right knee were individually prepped appropriately after time out was performed.   Sterile technique was observed and injection of 1 cc of Depo-Medrol 40 mg with several cc's of plain xylocaine. Anesthesia was provided by ethyl chloride and a 20-gauge needle was used to inject each knee area. The injections were tolerated well.  A band aid dressing was applied.  The patient was advised to apply ice later today and tomorrow to the injection sight as needed.   Electronically [place], MD 11/21/20173:59 PM

## 2016-08-17 DIAGNOSIS — I5030 Unspecified diastolic (congestive) heart failure: Secondary | ICD-10-CM | POA: Diagnosis not present

## 2016-08-25 DIAGNOSIS — J45909 Unspecified asthma, uncomplicated: Secondary | ICD-10-CM | POA: Diagnosis not present

## 2016-08-30 ENCOUNTER — Telehealth: Payer: Self-pay | Admitting: Orthopaedic Surgery

## 2016-08-30 ENCOUNTER — Ambulatory Visit (INDEPENDENT_AMBULATORY_CARE_PROVIDER_SITE_OTHER): Payer: Medicare Other | Admitting: Orthopaedic Surgery

## 2016-08-30 ENCOUNTER — Encounter: Payer: Self-pay | Admitting: Orthopaedic Surgery

## 2016-08-30 VITALS — BP 119/83 | HR 71 | Temp 97.7°F | Ht 64.0 in | Wt 171.0 lb

## 2016-08-30 DIAGNOSIS — M25561 Pain in right knee: Secondary | ICD-10-CM

## 2016-08-30 DIAGNOSIS — G6289 Other specified polyneuropathies: Secondary | ICD-10-CM

## 2016-08-30 DIAGNOSIS — M25562 Pain in left knee: Secondary | ICD-10-CM

## 2016-08-30 DIAGNOSIS — F17210 Nicotine dependence, cigarettes, uncomplicated: Secondary | ICD-10-CM

## 2016-08-30 DIAGNOSIS — G8929 Other chronic pain: Secondary | ICD-10-CM

## 2016-08-30 MED ORDER — HYDROCODONE-ACETAMINOPHEN 5-325 MG PO TABS: 1.0000 | ORAL_TABLET | Freq: Four times a day (QID) | ORAL | 0 refills | Status: DC | PRN

## 2016-08-30 NOTE — Patient Instructions (Signed)
Steps to Quit Smoking Smoking tobacco can be bad for your health. It can also affect almost every organ in your body. Smoking puts you and people around you at risk for many serious long-lasting (chronic) diseases. Quitting smoking is hard, but it is one of the best things that you can do for your health. It is never too late to quit. What are the benefits of quitting smoking? When you quit smoking, you lower your risk for getting serious diseases and conditions. They can include:  Lung cancer or lung disease.  Heart disease.  Stroke.  Heart attack.  Not being able to have children (infertility).  Weak bones (osteoporosis) and broken bones (fractures). If you have coughing, wheezing, and shortness of breath, those symptoms may get better when you quit. You may also get sick less often. If you are pregnant, quitting smoking can help to lower your chances of having a baby of low birth weight. What can I do to help me quit smoking? Talk with your doctor about what can help you quit smoking. Some things you can do (strategies) include:  Quitting smoking totally, instead of slowly cutting back how much you smoke over a period of time.  Going to in-person counseling. You are more likely to quit if you go to many counseling sessions.  Using resources and support systems, such as:  Online chats with a counselor.  Phone quitlines.  Printed self-help materials.  Support groups or group counseling.  Text messaging programs.  Mobile phone apps or applications.  Taking medicines. Some of these medicines may have nicotine in them. If you are pregnant or breastfeeding, do not take any medicines to quit smoking unless your doctor says it is okay. Talk with your doctor about counseling or other things that can help you. Talk with your doctor about using more than one strategy at the same time, such as taking medicines while you are also going to in-person counseling. This can help make quitting  easier. What things can I do to make it easier to quit? Quitting smoking might feel very hard at first, but there is a lot that you can do to make it easier. Take these steps:  Talk to your family and friends. Ask them to support and encourage you.  Call phone quitlines, reach out to support groups, or work with a counselor.  Ask people who smoke to not smoke around you.  Avoid places that make you want (trigger) to smoke, such as:  Bars.  Parties.  Smoke-break areas at work.  Spend time with people who do not smoke.  Lower the stress in your life. Stress can make you want to smoke. Try these things to help your stress:  Getting regular exercise.  Deep-breathing exercises.  Yoga.  Meditating.  Doing a body scan. To do this, close your eyes, focus on one area of your body at a time from head to toe, and notice which parts of your body are tense. Try to relax the muscles in those areas.  Download or buy apps on your mobile phone or tablet that can help you stick to your quit plan. There are many free apps, such as QuitGuide from the CDC (Centers for Disease Control and Prevention). You can find more support from smokefree.gov and other websites. This information is not intended to replace advice given to you by your health care provider. Make sure you discuss any questions you have with your health care provider. Document Released: 06/24/2009 Document Revised: 04/25/2016 Document   Reviewed: 01/12/2015 Elsevier Interactive Patient Education  2017 Elsevier Inc.  

## 2016-08-30 NOTE — Progress Notes (Signed)
Patient UI:[id number] Lynn Logan, female DOB:11/26/1970, 45 y.o. [id number]  Chief Complaint  Patient presents with  . Knee Pain    Chronic bilateral    HPI  Lynn Logan is a 45 y.o. female who has chronic pain of both knees.  She has no giving way, no trauma.  She has swelling and popping.  She has been taking her medicine and being active. HPI  Body mass index is 29.35 kg/m.  ROS  Review of Systems  Constitutional: Positive for fatigue.       She has no diabetes She has no hypertension She has Asthma She smokes   HENT: Negative for congestion.   Respiratory: Positive for shortness of breath and wheezing.   Cardiovascular: Negative for chest pain.  Endocrine: Positive for cold intolerance.  Musculoskeletal: Positive for arthralgias, joint swelling and myalgias.  Allergic/Immunologic: Positive for environmental allergies.  Neurological: Negative for numbness.  Psychiatric/Behavioral: The patient is nervous/anxious.     Past Medical History:  Diagnosis Date  . Anxiety   . Arthritis    Rheumatoid  . Asthma   . CHF (congestive heart failure) ([place])   . Chronic back pain   . Depression   . Fibromyalgia   . Gout   . Right lumbar radiculopathy   . Vitiligo    face    Past Surgical History:  Procedure Laterality Date  . CESAREAN SECTION    . DILITATION & CURRETTAGE/HYSTROSCOPY WITH NOVASURE ABLATION N/A 07/07/2015   Procedure: HYSTEROSCOPY, UTERINE CURETTAGE, ENDOMETRIAL  ABLATION Uterine Cavity Length=6.5cm Uterine Cavity Width=4.5cm Power=161 Watts Time=1 minute 19 seconds;  Surgeon: Florian Buff, MD;  Location: [facility];  Service: Gynecology;  Laterality: N/A;  . POLYPECTOMY  07/07/2015   Procedure: POLYPECTOMY;  Surgeon: Florian Buff, MD;  Location: [facility];  Service: Gynecology;;  . SALPINGOOPHORECTOMY Bilateral 10/13/2015   Procedure: BILATERAL SALPINGO OOPHORECTOMY;  Surgeon: Florian Buff, MD;  Location: [facility];  Service: Gynecology;  Laterality: Bilateral;   . TUBAL LIGATION    . VAGINAL HYSTERECTOMY N/A 10/13/2015   Procedure: HYSTERECTOMY VAGINAL;  Surgeon: Florian Buff, MD;  Location: [facility];  Service: Gynecology;  Laterality: N/A;    Family History  Problem Relation Age of Onset  . Diabetes Mother   . Congestive Heart Failure Mother   . Depression Father   . Hypertension Father   . Cancer Father   . Heart disease Maternal Uncle     Social History Social History  Substance Use Topics  . Smoking status: Heavy Tobacco Smoker    Packs/day: 3.00    Years: 30.00    Types: Cigarettes  . Smokeless tobacco: Former Systems developer  . Alcohol use No    Allergies  Allergen Reactions  . Toradol [Ketorolac Tromethamine] Itching  . Buspirone Nausea And Vomiting  . Lyrica [Pregabalin] Nausea And Vomiting  . Gabapentin Nausea And Vomiting  . Tramadol Nausea And Vomiting  . Zofran [Ondansetron Hcl] Nausea And Vomiting  . Penicillins Rash    Has patient had a PCN reaction causing immediate rash, facial/tongue/throat swelling, SOB or lightheadedness with hypotension: No Has patient had a PCN reaction causing severe rash involving mucus membranes or skin necrosis: No Has patient had a PCN reaction that required hospitalization No Has patient had a PCN reaction occurring within the last 10 years: yes If all of the above answers are "NO", then may proceed with Cephalosporin use.     Current Outpatient Prescriptions  Medication Sig Dispense Refill  . albuterol (PROVENTIL  HFA;VENTOLIN HFA) 108 (90 Base) MCG/ACT inhaler Inhale 2 puffs into the lungs every 6 (six) hours as needed.    Marland Kitchen albuterol (PROVENTIL) (2.5 MG/3ML) 0.083% nebulizer solution Take 3 mLs (2.5 mg total) by nebulization every 6 (six) hours as needed for wheezing or shortness of breath. 150 mL 1  . budesonide-formoterol (SYMBICORT) 160-4.5 MCG/ACT inhaler Inhale 2 puffs into the lungs 2 (two) times daily.    . citalopram (CELEXA) 20 MG tablet Take 1 tablet by mouth daily.    . diclofenac  sodium (VOLTAREN) 1 % GEL Apply 2 g topically 4 (four) times daily. 100 g 2  . DULoxetine (CYMBALTA) 60 MG capsule TAKE ONE CAPSULE BY MOUTH ONCE DAILY 30 capsule 5  . ergocalciferol (VITAMIN D2) 50000 units capsule Take 1 capsule by mouth once a week.    . estradiol (ESTRACE) 2 MG tablet Take 1 tablet (2 mg total) by mouth daily. 30 tablet 11  . ferrous sulfate 324 (65 Fe) MG TBEC Take 1 tablet (325 mg total) by mouth daily. 30 tablet 3  . HYDROcodone-acetaminophen (NORCO/VICODIN) 5-325 MG tablet Take 1 tablet by mouth every 6 (six) hours as needed for moderate pain (Must last 30 days.Do not take and drive a car or use machinery.). 90 tablet 0  . hydrOXYzine (ATARAX/VISTARIL) 25 MG tablet Take 1 tablet by mouth at bedtime.    . megestrol (MEGACE) 40 MG tablet Take 1 tablet (40 mg total) by mouth daily. 90 tablet 3  . omeprazole (PRILOSEC) 20 MG capsule Take 1 capsule (20 mg total) by mouth daily. 1 tablet a day 90 capsule 0  . piroxicam (FELDENE) 20 MG capsule Take 1 capsule (20 mg total) by mouth daily. 30 capsule 2  . polyethylene glycol powder (GLYCOLAX/MIRALAX) powder Take 17 g by mouth 2 (two) times daily as needed. 3350 g 1  . potassium chloride SA (K-DUR,KLOR-CON) 20 MEQ tablet Take 2 tablets (40 mEq total) by mouth 2 (two) times daily. 28 tablet 0   No current facility-administered medications for this visit.      Physical Exam  Blood pressure 119/83, pulse 71, temperature 97.7 F (36.5 C), height 5\' 4"  (1.626 m), weight 171 lb (77.6 kg), last menstrual period 09/12/2015.  Constitutional: overall normal hygiene, normal nutrition, well developed, normal grooming, normal body habitus. Assistive device:none  Musculoskeletal: gait and station Limp left, muscle tone and strength are normal, no tremors or atrophy is present.  .  Neurological: coordination overall normal.  Deep tendon reflex/nerve stretch intact.  Sensation normal.  Cranial nerves II-XII intact.   Skin:   Normal  overall no scars, lesions, ulcers or rashes. No psoriasis.  Psychiatric: Alert and oriented x 3.  Recent memory intact, remote memory unclear.  Normal mood and affect. Well groomed.  Good eye contact.  Cardiovascular: overall no swelling, no varicosities, no edema bilaterally, normal temperatures of the legs and arms, no clubbing, cyanosis and good capillary refill.  Lymphatic: palpation is normal.  The bilateral lower extremity is examined:  Inspection:  Thigh:  Non-tender and no defects  Knee has swelling 1+ effusion.                        Joint tenderness is present                        Patient is tender over the medial joint line  Lower Leg:  Has normal appearance and no tenderness or defects  Ankle:  Non-tender and no defects  Foot:  Non-tender and no defects Range of Motion:  Knee:  Range of motion is: 0-105 right and 0 - 100 left                        Crepitus is  present  Ankle:  Range of motion is normal. Strength and Tone:  The bilateral lower extremity has normal strength and tone. Stability:  Knee:  The knee is stable.  Ankle:  The ankle is stable.    The patient has been educated about the nature of the problem(s) and counseled on treatment options.  The patient appeared to understand what I have discussed and is in agreement with it.  Encounter Diagnoses  Name Primary?  . Chronic pain of right knee Yes  . Chronic pain of left knee   . Cigarette nicotine dependence without complication   . Other polyneuropathy ([place])     PLAN Call if any problems.  Precautions discussed.  Continue current medications.   Return to clinic 1 month   Electronically Signed Sanjuana Kava, MD 12/20/20171:55 PM

## 2016-09-17 DIAGNOSIS — I5030 Unspecified diastolic (congestive) heart failure: Secondary | ICD-10-CM | POA: Diagnosis not present

## 2016-09-25 DIAGNOSIS — J45909 Unspecified asthma, uncomplicated: Secondary | ICD-10-CM | POA: Diagnosis not present

## 2016-09-28 ENCOUNTER — Ambulatory Visit: Payer: Medicare Other | Admitting: Orthopaedic Surgery

## 2016-09-29 ENCOUNTER — Ambulatory Visit (INDEPENDENT_AMBULATORY_CARE_PROVIDER_SITE_OTHER): Payer: Medicare Other | Admitting: Orthopaedic Surgery

## 2016-09-29 VITALS — BP 113/81 | HR 65 | Temp 97.5°F | Ht 64.0 in | Wt 169.8 lb

## 2016-09-29 DIAGNOSIS — F17210 Nicotine dependence, cigarettes, uncomplicated: Secondary | ICD-10-CM | POA: Diagnosis not present

## 2016-09-29 DIAGNOSIS — M25561 Pain in right knee: Secondary | ICD-10-CM

## 2016-09-29 DIAGNOSIS — M25562 Pain in left knee: Secondary | ICD-10-CM | POA: Diagnosis not present

## 2016-09-29 DIAGNOSIS — G8929 Other chronic pain: Secondary | ICD-10-CM

## 2016-09-29 MED ORDER — HYDROCODONE-ACETAMINOPHEN 5-325 MG PO TABS: 1.0000 | ORAL_TABLET | Freq: Four times a day (QID) | ORAL | 0 refills | Status: DC | PRN

## 2016-09-29 NOTE — Progress Notes (Signed)
CC: Both of my knees are hurting. I would like an injection in both knees.  The patient has had chronic pain and tenderness of both knees for some time.  Injections help.  There is no locking or giving way of the knee.  There is no new trauma. There is no redness or signs of infections.  The knees have a mild effusion and some crepitus.  There is no redness or signs of recent trauma.  Right knee ROM is 0-110 and left knee ROM is 0-105.  Impression:  Chronic pain of the both knees  Return:  1 month  PROCEDURE NOTE:  The patient requests injections of both knees, verbal consent was obtained.  The left and right knee were individually prepped appropriately after time out was performed.   Sterile technique was observed and injection of 1 cc of Depo-Medrol 40 mg with several cc's of plain xylocaine. Anesthesia was provided by ethyl chloride and a 20-gauge needle was used to inject each knee area. The injections were tolerated well.  A band aid dressing was applied.  The patient was advised to apply ice later today and tomorrow to the injection sight as needed.  She continues to smoke but is trying to cut back.  Encounter Diagnoses  Name Primary?  . Chronic pain of right knee Yes  . Chronic pain of left knee   . Cigarette nicotine dependence without complication     I have renewed pain medicine after checking state narcotic web site.  Call if any problem.  Electronically Signed Sanjuana Kava, MD 1/19/20189:01 AM

## 2016-09-29 NOTE — Patient Instructions (Signed)
Steps to Quit Smoking Smoking tobacco can be bad for your health. It can also affect almost every organ in your body. Smoking puts you and people around you at risk for many serious long-lasting (chronic) diseases. Quitting smoking is hard, but it is one of the best things that you can do for your health. It is never too late to quit. What are the benefits of quitting smoking? When you quit smoking, you lower your risk for getting serious diseases and conditions. They can include:  Lung cancer or lung disease.  Heart disease.  Stroke.  Heart attack.  Not being able to have children (infertility).  Weak bones (osteoporosis) and broken bones (fractures). If you have coughing, wheezing, and shortness of breath, those symptoms may get better when you quit. You may also get sick less often. If you are pregnant, quitting smoking can help to lower your chances of having a baby of low birth weight. What can I do to help me quit smoking? Talk with your doctor about what can help you quit smoking. Some things you can do (strategies) include:  Quitting smoking totally, instead of slowly cutting back how much you smoke over a period of time.  Going to in-person counseling. You are more likely to quit if you go to many counseling sessions.  Using resources and support systems, such as:  Online chats with a counselor.  Phone quitlines.  Printed self-help materials.  Support groups or group counseling.  Text messaging programs.  Mobile phone apps or applications.  Taking medicines. Some of these medicines may have nicotine in them. If you are pregnant or breastfeeding, do not take any medicines to quit smoking unless your doctor says it is okay. Talk with your doctor about counseling or other things that can help you. Talk with your doctor about using more than one strategy at the same time, such as taking medicines while you are also going to in-person counseling. This can help make quitting  easier. What things can I do to make it easier to quit? Quitting smoking might feel very hard at first, but there is a lot that you can do to make it easier. Take these steps:  Talk to your family and friends. Ask them to support and encourage you.  Call phone quitlines, reach out to support groups, or work with a counselor.  Ask people who smoke to not smoke around you.  Avoid places that make you want (trigger) to smoke, such as:  Bars.  Parties.  Smoke-break areas at work.  Spend time with people who do not smoke.  Lower the stress in your life. Stress can make you want to smoke. Try these things to help your stress:  Getting regular exercise.  Deep-breathing exercises.  Yoga.  Meditating.  Doing a body scan. To do this, close your eyes, focus on one area of your body at a time from head to toe, and notice which parts of your body are tense. Try to relax the muscles in those areas.  Download or buy apps on your mobile phone or tablet that can help you stick to your quit plan. There are many free apps, such as QuitGuide from the CDC (Centers for Disease Control and Prevention). You can find more support from smokefree.gov and other websites. This information is not intended to replace advice given to you by your health care provider. Make sure you discuss any questions you have with your health care provider. Document Released: 06/24/2009 Document Revised: 04/25/2016 Document   Reviewed: 01/12/2015 Elsevier Interactive Patient Education  2017 Elsevier Inc.  

## 2016-10-18 DIAGNOSIS — I5030 Unspecified diastolic (congestive) heart failure: Secondary | ICD-10-CM | POA: Diagnosis not present

## 2016-10-26 ENCOUNTER — Encounter: Payer: Self-pay | Admitting: Orthopaedic Surgery

## 2016-10-26 ENCOUNTER — Ambulatory Visit (INDEPENDENT_AMBULATORY_CARE_PROVIDER_SITE_OTHER): Payer: Medicare Other | Admitting: Orthopaedic Surgery

## 2016-10-26 VITALS — BP 119/84 | HR 65 | Temp 98.1°F | Ht 64.0 in | Wt 167.0 lb

## 2016-10-26 DIAGNOSIS — G8929 Other chronic pain: Secondary | ICD-10-CM

## 2016-10-26 DIAGNOSIS — M25561 Pain in right knee: Secondary | ICD-10-CM | POA: Diagnosis not present

## 2016-10-26 DIAGNOSIS — J45909 Unspecified asthma, uncomplicated: Secondary | ICD-10-CM | POA: Diagnosis not present

## 2016-10-26 DIAGNOSIS — M25562 Pain in left knee: Secondary | ICD-10-CM | POA: Diagnosis not present

## 2016-10-26 DIAGNOSIS — F17210 Nicotine dependence, cigarettes, uncomplicated: Secondary | ICD-10-CM | POA: Diagnosis not present

## 2016-10-26 DIAGNOSIS — G6289 Other specified polyneuropathies: Secondary | ICD-10-CM | POA: Diagnosis not present

## 2016-10-26 MED ORDER — HYDROCODONE-ACETAMINOPHEN 5-325 MG PO TABS: 1.0000 | ORAL_TABLET | Freq: Four times a day (QID) | ORAL | 0 refills | Status: DC | PRN

## 2016-10-26 NOTE — Progress Notes (Signed)
Patient UI:[id number] Lynn Logan, female DOB:Jul 05, 1971, 46 y.o. [id number]  Chief Complaint  Patient presents with  . Follow-up    bilateral knee pain    HPI  Lynn Logan is a 46 y.o. female who has chronic pain of both knees.  She is a little better after the injections last time. She has no giving way, no locking. She has swelling and popping. She is active and taking her medicine. HPI  Body mass index is 28.67 kg/m.  ROS  Review of Systems  Constitutional: Positive for fatigue.       She has no diabetes She has no hypertension She has Asthma She smokes   HENT: Negative for congestion.   Respiratory: Positive for shortness of breath and wheezing.   Cardiovascular: Negative for chest pain.  Endocrine: Positive for cold intolerance.  Musculoskeletal: Positive for arthralgias, joint swelling and myalgias.  Allergic/Immunologic: Positive for environmental allergies.  Neurological: Negative for numbness.  Psychiatric/Behavioral: The patient is nervous/anxious.     Past Medical History:  Diagnosis Date  . Anxiety   . Arthritis    Rheumatoid  . Asthma   . CHF (congestive heart failure) ([place])   . Chronic back pain   . Depression   . Fibromyalgia   . Gout   . Right lumbar radiculopathy   . Vitiligo    face    Past Surgical History:  Procedure Laterality Date  . CESAREAN SECTION    . DILITATION & CURRETTAGE/HYSTROSCOPY WITH NOVASURE ABLATION N/A 07/07/2015   Procedure: HYSTEROSCOPY, UTERINE CURETTAGE, ENDOMETRIAL  ABLATION Uterine Cavity Length=6.5cm Uterine Cavity Width=4.5cm Power=161 Watts Time=1 minute 19 seconds;  Surgeon: Florian Buff, MD;  Location: [facility];  Service: Gynecology;  Laterality: N/A;  . POLYPECTOMY  07/07/2015   Procedure: POLYPECTOMY;  Surgeon: Florian Buff, MD;  Location: [facility];  Service: Gynecology;;  . SALPINGOOPHORECTOMY Bilateral 10/13/2015   Procedure: BILATERAL SALPINGO OOPHORECTOMY;  Surgeon: Florian Buff, MD;  Location: [facility];   Service: Gynecology;  Laterality: Bilateral;  . TUBAL LIGATION    . VAGINAL HYSTERECTOMY N/A 10/13/2015   Procedure: HYSTERECTOMY VAGINAL;  Surgeon: Florian Buff, MD;  Location: [facility];  Service: Gynecology;  Laterality: N/A;    Family History  Problem Relation Age of Onset  . Diabetes Mother   . Congestive Heart Failure Mother   . Depression Father   . Hypertension Father   . Cancer Father   . Heart disease Maternal Uncle     Social History Social History  Substance Use Topics  . Smoking status: Heavy Tobacco Smoker    Packs/day: 3.00    Years: 30.00    Types: Cigarettes  . Smokeless tobacco: Former Systems developer  . Alcohol use No    Allergies  Allergen Reactions  . Toradol [Ketorolac Tromethamine] Itching  . Buspirone Nausea And Vomiting  . Lyrica [Pregabalin] Nausea And Vomiting  . Gabapentin Nausea And Vomiting  . Tramadol Nausea And Vomiting  . Zofran [Ondansetron Hcl] Nausea And Vomiting  . Penicillins Rash    Has patient had a PCN reaction causing immediate rash, facial/tongue/throat swelling, SOB or lightheadedness with hypotension: No Has patient had a PCN reaction causing severe rash involving mucus membranes or skin necrosis: No Has patient had a PCN reaction that required hospitalization No Has patient had a PCN reaction occurring within the last 10 years: yes If all of the above answers are "NO", then may proceed with Cephalosporin use.     Current Outpatient Prescriptions  Medication Sig  Dispense Refill  . albuterol (PROVENTIL HFA;VENTOLIN HFA) 108 (90 Base) MCG/ACT inhaler Inhale 2 puffs into the lungs every 6 (six) hours as needed.    Marland Kitchen albuterol (PROVENTIL) (2.5 MG/3ML) 0.083% nebulizer solution Take 3 mLs (2.5 mg total) by nebulization every 6 (six) hours as needed for wheezing or shortness of breath. 150 mL 1  . budesonide-formoterol (SYMBICORT) 160-4.5 MCG/ACT inhaler Inhale 2 puffs into the lungs 2 (two) times daily.    . citalopram (CELEXA) 20 MG tablet  Take 1 tablet by mouth daily.    . diclofenac sodium (VOLTAREN) 1 % GEL Apply 2 g topically 4 (four) times daily. 100 g 2  . DULoxetine (CYMBALTA) 60 MG capsule TAKE ONE CAPSULE BY MOUTH ONCE DAILY 30 capsule 5  . ergocalciferol (VITAMIN D2) 50000 units capsule Take 1 capsule by mouth once a week.    . estradiol (ESTRACE) 2 MG tablet Take 1 tablet (2 mg total) by mouth daily. 30 tablet 11  . ferrous sulfate 324 (65 Fe) MG TBEC Take 1 tablet (325 mg total) by mouth daily. 30 tablet 3  . HYDROcodone-acetaminophen (NORCO/VICODIN) 5-325 MG tablet Take 1 tablet by mouth every 6 (six) hours as needed for moderate pain (Must last 30 days.Do not take and drive a car or use machinery.). 70 tablet 0  . hydrOXYzine (ATARAX/VISTARIL) 25 MG tablet Take 1 tablet by mouth at bedtime.    . megestrol (MEGACE) 40 MG tablet Take 1 tablet (40 mg total) by mouth daily. 90 tablet 3  . omeprazole (PRILOSEC) 20 MG capsule Take 1 capsule (20 mg total) by mouth daily. 1 tablet a day 90 capsule 0  . piroxicam (FELDENE) 20 MG capsule Take 1 capsule (20 mg total) by mouth daily. 30 capsule 2  . polyethylene glycol powder (GLYCOLAX/MIRALAX) powder Take 17 g by mouth 2 (two) times daily as needed. 3350 g 1  . potassium chloride SA (K-DUR,KLOR-CON) 20 MEQ tablet Take 2 tablets (40 mEq total) by mouth 2 (two) times daily. 28 tablet 0   No current facility-administered medications for this visit.      Physical Exam  Blood pressure 119/84, pulse 65, temperature 98.1 F (36.7 C), height 5\' 4"  (1.626 m), weight 167 lb (75.8 kg), last menstrual period 09/12/2015.  Constitutional: overall normal hygiene, normal nutrition, well developed, normal grooming, normal body habitus. Assistive device:none  Musculoskeletal: gait and station Limp right, muscle tone and strength are normal, no tremors or atrophy is present.  .  Neurological: coordination overall normal.  Deep tendon reflex/nerve stretch intact.  Sensation normal.   Cranial nerves II-XII intact.   Skin:   Normal overall no scars, lesions, ulcers or rashes. No psoriasis.  Psychiatric: Alert and oriented x 3.  Recent memory intact, remote memory unclear.  Normal mood and affect. Well groomed.  Good eye contact.  Cardiovascular: overall no swelling, no varicosities, no edema bilaterally, normal temperatures of the legs and arms, no clubbing, cyanosis and good capillary refill.  Lymphatic: palpation is normal.  The bilateral lower extremity is examined:  Inspection:  Thigh:  Non-tender and no defects  Knee has swelling 1+ effusion.                        Joint tenderness is present                        Patient is tender over the medial joint line  Lower Leg:  Has normal  appearance and no tenderness or defects  Ankle:  Non-tender and no defects  Foot:  Non-tender and no defects Range of Motion:  Knee:  Range of motion is: 0-110 right, 0 to 115 left                        Crepitus is  present  Ankle:  Range of motion is normal. Strength and Tone:  The bilateral lower extremity has normal strength and tone. Stability:  Knee:  The knee is stable.  Ankle:  The ankle is stable.    The patient has been educated about the nature of the problem(s) and counseled on treatment options.  The patient appeared to understand what I have discussed and is in agreement with it.  Encounter Diagnoses  Name Primary?  . Chronic pain of right knee Yes  . Chronic pain of left knee   . Other polyneuropathy ([place])   . Cigarette nicotine dependence without complication    She continues to smoke and is not willing to stop.  PLAN Call if any problems.  Precautions discussed.  Continue current medications.   Return to clinic 1 month   I have reviewed the [place] web site prior to prescribing narcotic medicine for this patient.  Electronically Signed Sanjuana Kava, MD 2/15/20182:13 PM

## 2016-11-15 DIAGNOSIS — I5030 Unspecified diastolic (congestive) heart failure: Secondary | ICD-10-CM | POA: Diagnosis not present

## 2016-11-23 ENCOUNTER — Encounter: Payer: Self-pay | Admitting: Orthopaedic Surgery

## 2016-11-23 ENCOUNTER — Ambulatory Visit (INDEPENDENT_AMBULATORY_CARE_PROVIDER_SITE_OTHER): Payer: Medicare Other | Admitting: Orthopaedic Surgery

## 2016-11-23 VITALS — BP 141/83 | HR 72 | Ht 64.0 in | Wt 170.0 lb

## 2016-11-23 DIAGNOSIS — G8929 Other chronic pain: Secondary | ICD-10-CM

## 2016-11-23 DIAGNOSIS — F17210 Nicotine dependence, cigarettes, uncomplicated: Secondary | ICD-10-CM

## 2016-11-23 DIAGNOSIS — M25562 Pain in left knee: Secondary | ICD-10-CM

## 2016-11-23 DIAGNOSIS — J45909 Unspecified asthma, uncomplicated: Secondary | ICD-10-CM | POA: Diagnosis not present

## 2016-11-23 MED ORDER — HYDROCODONE-ACETAMINOPHEN 5-325 MG PO TABS: 1.0000 | ORAL_TABLET | Freq: Four times a day (QID) | ORAL | 0 refills | Status: DC | PRN

## 2016-11-23 NOTE — Patient Instructions (Signed)
Steps to Quit Smoking Smoking tobacco can be bad for your health. It can also affect almost every organ in your body. Smoking puts you and people around you at risk for many serious long-lasting (chronic) diseases. Quitting smoking is hard, but it is one of the best things that you can do for your health. It is never too late to quit. What are the benefits of quitting smoking? When you quit smoking, you lower your risk for getting serious diseases and conditions. They can include:  Lung cancer or lung disease.  Heart disease.  Stroke.  Heart attack.  Not being able to have children (infertility).  Weak bones (osteoporosis) and broken bones (fractures). If you have coughing, wheezing, and shortness of breath, those symptoms may get better when you quit. You may also get sick less often. If you are pregnant, quitting smoking can help to lower your chances of having a baby of low birth weight. What can I do to help me quit smoking? Talk with your doctor about what can help you quit smoking. Some things you can do (strategies) include:  Quitting smoking totally, instead of slowly cutting back how much you smoke over a period of time.  Going to in-person counseling. You are more likely to quit if you go to many counseling sessions.  Using resources and support systems, such as:  Online chats with a counselor.  Phone quitlines.  Printed self-help materials.  Support groups or group counseling.  Text messaging programs.  Mobile phone apps or applications.  Taking medicines. Some of these medicines may have nicotine in them. If you are pregnant or breastfeeding, do not take any medicines to quit smoking unless your doctor says it is okay. Talk with your doctor about counseling or other things that can help you. Talk with your doctor about using more than one strategy at the same time, such as taking medicines while you are also going to in-person counseling. This can help make quitting  easier. What things can I do to make it easier to quit? Quitting smoking might feel very hard at first, but there is a lot that you can do to make it easier. Take these steps:  Talk to your family and friends. Ask them to support and encourage you.  Call phone quitlines, reach out to support groups, or work with a counselor.  Ask people who smoke to not smoke around you.  Avoid places that make you want (trigger) to smoke, such as:  Bars.  Parties.  Smoke-break areas at work.  Spend time with people who do not smoke.  Lower the stress in your life. Stress can make you want to smoke. Try these things to help your stress:  Getting regular exercise.  Deep-breathing exercises.  Yoga.  Meditating.  Doing a body scan. To do this, close your eyes, focus on one area of your body at a time from head to toe, and notice which parts of your body are tense. Try to relax the muscles in those areas.  Download or buy apps on your mobile phone or tablet that can help you stick to your quit plan. There are many free apps, such as QuitGuide from the CDC (Centers for Disease Control and Prevention). You can find more support from smokefree.gov and other websites. This information is not intended to replace advice given to you by your health care provider. Make sure you discuss any questions you have with your health care provider. Document Released: 06/24/2009 Document Revised: 04/25/2016 Document   Reviewed: 01/12/2015 Elsevier Interactive Patient Education  2017 Elsevier Inc.  

## 2016-11-23 NOTE — Progress Notes (Signed)
CC:  I have pain of my left knee. I would like an injection.  The patient has chronic pain of the left knee.  There is no recent trauma.  There is no redness.  Injections in the past have helped.  The knee has no redness, has an effusion and crepitus present.  ROM of the left knee is 0-105.  Impression:  Chronic knee pain left  Return: 1 month  PROCEDURE NOTE:  The patient requests injections of the left knee, verbal consent was obtained.  The left knee was prepped appropriately after time out was performed.   Sterile technique was observed and injection of 1 cc of Depo-Medrol 40 mg with several cc's of plain xylocaine. Anesthesia was provided by ethyl chloride and a 20-gauge needle was used to inject the knee area. The injection was tolerated well.  A band aid dressing was applied.  The patient was advised to apply ice later today and tomorrow to the injection sight as needed.  I have reviewed the [place] web site prior to prescribing narcotic medicine for this patient.  Encounter Diagnoses  Name Primary?  . Chronic pain of left knee Yes  . Cigarette nicotine dependence without complication    She continues to smoke and not willing to stop. Electronically Signed Sanjuana Kava, MD 3/15/20182:54 PM

## 2016-11-28 ENCOUNTER — Telehealth: Payer: Self-pay | Admitting: Orthopaedic Surgery

## 2016-11-28 MED ORDER — NAPROXEN 500 MG PO TABS: 500.0000 mg | ORAL_TABLET | Freq: Two times a day (BID) | ORAL | 5 refills | Status: DC

## 2016-11-28 NOTE — Telephone Encounter (Signed)
Patient requests refill on Naprosyn 500 mgs.  Qty 60

## 2016-12-08 DIAGNOSIS — R42 Dizziness and giddiness: Secondary | ICD-10-CM | POA: Diagnosis not present

## 2016-12-08 DIAGNOSIS — R531 Weakness: Secondary | ICD-10-CM | POA: Diagnosis not present

## 2016-12-08 DIAGNOSIS — H9203 Otalgia, bilateral: Secondary | ICD-10-CM | POA: Diagnosis not present

## 2016-12-08 DIAGNOSIS — Z79899 Other long term (current) drug therapy: Secondary | ICD-10-CM | POA: Diagnosis not present

## 2016-12-08 DIAGNOSIS — R1110 Vomiting, unspecified: Secondary | ICD-10-CM | POA: Diagnosis not present

## 2016-12-16 DIAGNOSIS — I5030 Unspecified diastolic (congestive) heart failure: Secondary | ICD-10-CM | POA: Diagnosis not present

## 2016-12-21 ENCOUNTER — Encounter: Payer: Self-pay | Admitting: Orthopaedic Surgery

## 2016-12-21 ENCOUNTER — Ambulatory Visit (INDEPENDENT_AMBULATORY_CARE_PROVIDER_SITE_OTHER): Payer: Medicare Other | Admitting: Orthopaedic Surgery

## 2016-12-21 VITALS — BP 145/95 | HR 78 | Temp 97.9°F | Ht 64.0 in | Wt 176.0 lb

## 2016-12-21 DIAGNOSIS — M25562 Pain in left knee: Secondary | ICD-10-CM | POA: Diagnosis not present

## 2016-12-21 DIAGNOSIS — G8929 Other chronic pain: Secondary | ICD-10-CM

## 2016-12-21 DIAGNOSIS — M25561 Pain in right knee: Secondary | ICD-10-CM | POA: Diagnosis not present

## 2016-12-21 DIAGNOSIS — F17210 Nicotine dependence, cigarettes, uncomplicated: Secondary | ICD-10-CM

## 2016-12-21 MED ORDER — HYDROCODONE-ACETAMINOPHEN 5-325 MG PO TABS: 1.0000 | ORAL_TABLET | Freq: Four times a day (QID) | ORAL | 0 refills | Status: DC | PRN

## 2016-12-21 MED ORDER — NAPROXEN 500 MG PO TABS: 500.0000 mg | ORAL_TABLET | Freq: Two times a day (BID) | ORAL | 5 refills | Status: DC

## 2016-12-21 NOTE — Progress Notes (Signed)
CC: Both of my knees are hurting. I would like an injection in both knees.  The patient has had chronic pain and tenderness of both knees for some time.  Injections help.  There is no locking or giving way of the knee.  There is no new trauma. There is no redness or signs of infections.  The knees have a mild effusion and some crepitus.  There is no redness or signs of recent trauma.  Right knee ROM is 0-105 and left knee ROM is 0-100.  Impression:  Chronic pain of the both knees  Return:  1 month  PROCEDURE NOTE:  The patient requests injections of both knees, verbal consent was obtained.  The left and right knee were individually prepped appropriately after time out was performed.   Sterile technique was observed and injection of 1 cc of Depo-Medrol 40 mg with several cc's of plain xylocaine. Anesthesia was provided by ethyl chloride and a 20-gauge needle was used to inject each knee area. The injections were tolerated well.  A band aid dressing was applied.  The patient was advised to apply ice later today and tomorrow to the injection sight as needed.   I have reviewed the [place] web site prior to prescribing narcotic medicine for this patient.  Electronically Signed Sanjuana Kava, MD 4/12/201810:21 AM

## 2016-12-21 NOTE — Addendum Note (Signed)
Addended by: Willette Pa on: 12/21/2016 10:23 AM   Modules accepted: Orders

## 2016-12-25 DIAGNOSIS — A084 Viral intestinal infection, unspecified: Secondary | ICD-10-CM | POA: Diagnosis not present

## 2017-01-05 DIAGNOSIS — M797 Fibromyalgia: Secondary | ICD-10-CM | POA: Diagnosis not present

## 2017-01-05 DIAGNOSIS — D508 Other iron deficiency anemias: Secondary | ICD-10-CM | POA: Diagnosis not present

## 2017-01-05 DIAGNOSIS — E559 Vitamin D deficiency, unspecified: Secondary | ICD-10-CM | POA: Diagnosis not present

## 2017-01-05 DIAGNOSIS — R350 Frequency of micturition: Secondary | ICD-10-CM | POA: Diagnosis not present

## 2017-01-08 ENCOUNTER — Other Ambulatory Visit: Payer: Self-pay | Admitting: Family Medicine

## 2017-01-10 DIAGNOSIS — N62 Hypertrophy of breast: Secondary | ICD-10-CM | POA: Diagnosis not present

## 2017-01-11 DIAGNOSIS — J449 Chronic obstructive pulmonary disease, unspecified: Secondary | ICD-10-CM | POA: Diagnosis not present

## 2017-01-12 ENCOUNTER — Other Ambulatory Visit: Payer: Self-pay | Admitting: Pediatrics

## 2017-01-15 ENCOUNTER — Other Ambulatory Visit: Payer: Medicare Other | Admitting: Obstetrics & Gynecology

## 2017-01-15 ENCOUNTER — Other Ambulatory Visit: Payer: Self-pay | Admitting: Orthopaedic Surgery

## 2017-01-15 ENCOUNTER — Other Ambulatory Visit: Payer: Self-pay | Admitting: *Deleted

## 2017-01-15 DIAGNOSIS — I5030 Unspecified diastolic (congestive) heart failure: Secondary | ICD-10-CM | POA: Diagnosis not present

## 2017-01-15 MED ORDER — HYDROCODONE-ACETAMINOPHEN 5-325 MG PO TABS: 1.0000 | ORAL_TABLET | Freq: Four times a day (QID) | ORAL | 0 refills | Status: DC | PRN

## 2017-01-16 ENCOUNTER — Telehealth: Payer: Self-pay | Admitting: Orthopedic Surgery

## 2017-01-16 ENCOUNTER — Other Ambulatory Visit: Payer: Self-pay | Admitting: Orthopedic Surgery

## 2017-01-16 DIAGNOSIS — I10 Essential (primary) hypertension: Secondary | ICD-10-CM | POA: Diagnosis not present

## 2017-01-16 DIAGNOSIS — Z7951 Long term (current) use of inhaled steroids: Secondary | ICD-10-CM | POA: Diagnosis not present

## 2017-01-16 DIAGNOSIS — I509 Heart failure, unspecified: Secondary | ICD-10-CM | POA: Diagnosis not present

## 2017-01-16 DIAGNOSIS — I252 Old myocardial infarction: Secondary | ICD-10-CM | POA: Diagnosis not present

## 2017-01-16 DIAGNOSIS — R001 Bradycardia, unspecified: Secondary | ICD-10-CM | POA: Diagnosis not present

## 2017-01-16 DIAGNOSIS — Z79899 Other long term (current) drug therapy: Secondary | ICD-10-CM | POA: Diagnosis not present

## 2017-01-16 DIAGNOSIS — R079 Chest pain, unspecified: Secondary | ICD-10-CM | POA: Diagnosis not present

## 2017-01-16 DIAGNOSIS — J45909 Unspecified asthma, uncomplicated: Secondary | ICD-10-CM | POA: Diagnosis not present

## 2017-01-16 DIAGNOSIS — I4891 Unspecified atrial fibrillation: Secondary | ICD-10-CM | POA: Diagnosis not present

## 2017-01-16 DIAGNOSIS — R0789 Other chest pain: Secondary | ICD-10-CM | POA: Diagnosis not present

## 2017-01-16 DIAGNOSIS — Z8249 Family history of ischemic heart disease and other diseases of the circulatory system: Secondary | ICD-10-CM | POA: Diagnosis not present

## 2017-01-17 ENCOUNTER — Ambulatory Visit: Payer: Medicaid Other | Admitting: Orthopaedic Surgery

## 2017-01-18 ENCOUNTER — Ambulatory Visit: Payer: Medicaid Other | Admitting: Orthopaedic Surgery

## 2017-01-18 DIAGNOSIS — M170 Bilateral primary osteoarthritis of knee: Secondary | ICD-10-CM | POA: Diagnosis not present

## 2017-01-18 DIAGNOSIS — Z131 Encounter for screening for diabetes mellitus: Secondary | ICD-10-CM | POA: Diagnosis not present

## 2017-01-18 DIAGNOSIS — Z13220 Encounter for screening for lipoid disorders: Secondary | ICD-10-CM | POA: Diagnosis not present

## 2017-01-18 DIAGNOSIS — G8929 Other chronic pain: Secondary | ICD-10-CM | POA: Diagnosis not present

## 2017-01-18 DIAGNOSIS — Z79899 Other long term (current) drug therapy: Secondary | ICD-10-CM | POA: Diagnosis not present

## 2017-01-19 ENCOUNTER — Encounter: Payer: Self-pay | Admitting: Orthopaedic Surgery

## 2017-01-19 ENCOUNTER — Telehealth: Payer: Self-pay | Admitting: *Deleted

## 2017-01-19 ENCOUNTER — Ambulatory Visit (INDEPENDENT_AMBULATORY_CARE_PROVIDER_SITE_OTHER): Payer: Medicare Other | Admitting: Orthopaedic Surgery

## 2017-01-19 VITALS — BP 141/97 | HR 74 | Temp 97.9°F | Ht 64.0 in | Wt 180.0 lb

## 2017-01-19 DIAGNOSIS — F17210 Nicotine dependence, cigarettes, uncomplicated: Secondary | ICD-10-CM

## 2017-01-19 DIAGNOSIS — M25562 Pain in left knee: Secondary | ICD-10-CM

## 2017-01-19 DIAGNOSIS — M25561 Pain in right knee: Secondary | ICD-10-CM

## 2017-01-19 DIAGNOSIS — G8929 Other chronic pain: Secondary | ICD-10-CM | POA: Diagnosis not present

## 2017-01-19 MED ORDER — HYDROCODONE-ACETAMINOPHEN 5-325 MG PO TABS: 1.0000 | ORAL_TABLET | Freq: Four times a day (QID) | ORAL | 0 refills | Status: DC | PRN

## 2017-01-19 NOTE — Progress Notes (Signed)
CC: Both of my knees are hurting. I would like an injection in both knees.  The patient has had chronic pain and tenderness of both knees for some time.  Injections help.  There is no locking or giving way of the knee.  There is no new trauma. There is no redness or signs of infections.  The knees have a mild effusion and some crepitus.  There is no redness or signs of recent trauma.  Right knee ROM is 0-105 and left knee ROM is 0-110.  Impression:  Chronic pain of the both knees  Return:  1 month   She continues to smoke but has cut down to 1/2 pack a day which is an improvement.  PROCEDURE NOTE:  The patient requests injections of both knees, verbal consent was obtained.  The left and right knee were individually prepped appropriately after time out was performed.   Sterile technique was observed and injection of 1 cc of Depo-Medrol 40 mg with several cc's of plain xylocaine. Anesthesia was provided by ethyl chloride and a 20-gauge needle was used to inject each knee area. The injections were tolerated well.  A band aid dressing was applied.  The patient was advised to apply ice later today and tomorrow to the injection sight as needed.   Encounter Diagnoses  Name Primary?  . Chronic pain of left knee Yes  . Chronic pain of right knee   . Cigarette nicotine dependence without complication    Electronically Signed Sanjuana Kava, MD 5/11/20189:07 AM

## 2017-01-19 NOTE — Patient Instructions (Signed)
Steps to Quit Smoking Smoking tobacco can be bad for your health. It can also affect almost every organ in your body. Smoking puts you and people around you at risk for many serious long-lasting (chronic) diseases. Quitting smoking is hard, but it is one of the best things that you can do for your health. It is never too late to quit. What are the benefits of quitting smoking? When you quit smoking, you lower your risk for getting serious diseases and conditions. They can include:  Lung cancer or lung disease.  Heart disease.  Stroke.  Heart attack.  Not being able to have children (infertility).  Weak bones (osteoporosis) and broken bones (fractures). If you have coughing, wheezing, and shortness of breath, those symptoms may get better when you quit. You may also get sick less often. If you are pregnant, quitting smoking can help to lower your chances of having a baby of low birth weight. What can I do to help me quit smoking? Talk with your doctor about what can help you quit smoking. Some things you can do (strategies) include:  Quitting smoking totally, instead of slowly cutting back how much you smoke over a period of time.  Going to in-person counseling. You are more likely to quit if you go to many counseling sessions.  Using resources and support systems, such as:  Online chats with a counselor.  Phone quitlines.  Printed self-help materials.  Support groups or group counseling.  Text messaging programs.  Mobile phone apps or applications.  Taking medicines. Some of these medicines may have nicotine in them. If you are pregnant or breastfeeding, do not take any medicines to quit smoking unless your doctor says it is okay. Talk with your doctor about counseling or other things that can help you. Talk with your doctor about using more than one strategy at the same time, such as taking medicines while you are also going to in-person counseling. This can help make quitting  easier. What things can I do to make it easier to quit? Quitting smoking might feel very hard at first, but there is a lot that you can do to make it easier. Take these steps:  Talk to your family and friends. Ask them to support and encourage you.  Call phone quitlines, reach out to support groups, or work with a counselor.  Ask people who smoke to not smoke around you.  Avoid places that make you want (trigger) to smoke, such as:  Bars.  Parties.  Smoke-break areas at work.  Spend time with people who do not smoke.  Lower the stress in your life. Stress can make you want to smoke. Try these things to help your stress:  Getting regular exercise.  Deep-breathing exercises.  Yoga.  Meditating.  Doing a body scan. To do this, close your eyes, focus on one area of your body at a time from head to toe, and notice which parts of your body are tense. Try to relax the muscles in those areas.  Download or buy apps on your mobile phone or tablet that can help you stick to your quit plan. There are many free apps, such as QuitGuide from the CDC (Centers for Disease Control and Prevention). You can find more support from smokefree.gov and other websites. This information is not intended to replace advice given to you by your health care provider. Make sure you discuss any questions you have with your health care provider. Document Released: 06/24/2009 Document Revised: 04/25/2016 Document   Reviewed: 01/12/2015 Elsevier Interactive Patient Education  2017 Elsevier Inc.  

## 2017-01-19 NOTE — Telephone Encounter (Signed)
Patient called stating she wanted Dr Elonda Husky to call her in a medication for decreased sex drive. Advised he was not in the office today but she was already scheduled for a visit on 5.22 and could discuss this with him at that visit. Patient also stated she wanted me to make him aware that her left leg is more swollen than the right, is warm to touch and very tender. Advised patient that she could have a blood clot in her leg and should not wait until 5/22 to be seen for that. Advised to go to PCP or ER to be evaluated. Verbalized understanding.

## 2017-01-20 ENCOUNTER — Other Ambulatory Visit: Payer: Self-pay | Admitting: Orthopaedic Surgery

## 2017-01-22 ENCOUNTER — Other Ambulatory Visit: Payer: Self-pay | Admitting: Orthopaedic Surgery

## 2017-01-22 ENCOUNTER — Other Ambulatory Visit: Payer: Self-pay | Admitting: Family Medicine

## 2017-01-22 DIAGNOSIS — I10 Essential (primary) hypertension: Secondary | ICD-10-CM | POA: Diagnosis not present

## 2017-01-22 DIAGNOSIS — R319 Hematuria, unspecified: Secondary | ICD-10-CM | POA: Diagnosis not present

## 2017-01-22 DIAGNOSIS — D508 Other iron deficiency anemias: Secondary | ICD-10-CM | POA: Diagnosis not present

## 2017-01-22 DIAGNOSIS — E785 Hyperlipidemia, unspecified: Secondary | ICD-10-CM | POA: Diagnosis not present

## 2017-01-22 DIAGNOSIS — E559 Vitamin D deficiency, unspecified: Secondary | ICD-10-CM | POA: Diagnosis not present

## 2017-01-23 ENCOUNTER — Other Ambulatory Visit: Payer: Self-pay | Admitting: Orthopaedic Surgery

## 2017-01-23 ENCOUNTER — Telehealth: Payer: Self-pay | Admitting: Adult Health Nurse Practitioner

## 2017-01-23 NOTE — Telephone Encounter (Signed)
lmtcb jkp 5/15

## 2017-01-24 ENCOUNTER — Telehealth: Payer: Self-pay | Admitting: Obstetrics & Gynecology

## 2017-01-24 DIAGNOSIS — Z8249 Family history of ischemic heart disease and other diseases of the circulatory system: Secondary | ICD-10-CM | POA: Diagnosis not present

## 2017-01-24 DIAGNOSIS — Z800 Family history of malignant neoplasm of digestive organs: Secondary | ICD-10-CM | POA: Diagnosis not present

## 2017-01-24 DIAGNOSIS — D649 Anemia, unspecified: Secondary | ICD-10-CM | POA: Diagnosis not present

## 2017-01-24 DIAGNOSIS — Z833 Family history of diabetes mellitus: Secondary | ICD-10-CM | POA: Diagnosis not present

## 2017-01-24 DIAGNOSIS — J45909 Unspecified asthma, uncomplicated: Secondary | ICD-10-CM | POA: Diagnosis not present

## 2017-01-24 DIAGNOSIS — R7889 Finding of other specified substances, not normally found in blood: Secondary | ICD-10-CM | POA: Diagnosis not present

## 2017-01-24 DIAGNOSIS — K219 Gastro-esophageal reflux disease without esophagitis: Secondary | ICD-10-CM | POA: Diagnosis not present

## 2017-01-24 DIAGNOSIS — Z79899 Other long term (current) drug therapy: Secondary | ICD-10-CM | POA: Diagnosis not present

## 2017-01-24 NOTE — Telephone Encounter (Signed)
Pt called and left a message on our answering machine stating that she needs a refill of her medication. Please contact pt

## 2017-01-24 NOTE — Telephone Encounter (Signed)
Pt called in checking on rx. I told her that she has not been seen and also is dismissed. The doctor would not be calling in prescription.

## 2017-01-25 NOTE — Telephone Encounter (Signed)
Left message x 1. JSY 

## 2017-01-29 DIAGNOSIS — R112 Nausea with vomiting, unspecified: Secondary | ICD-10-CM | POA: Diagnosis not present

## 2017-01-29 DIAGNOSIS — A084 Viral intestinal infection, unspecified: Secondary | ICD-10-CM | POA: Diagnosis not present

## 2017-01-29 DIAGNOSIS — I10 Essential (primary) hypertension: Secondary | ICD-10-CM | POA: Diagnosis not present

## 2017-01-29 NOTE — Telephone Encounter (Signed)
Spoke with pt. Pt wants a pres for Lyrica and "something like Viagra". I advised she needs an appt. Pt states she has an appt tomorrow. I advised to keep that appt. Pt voiced understanding. [place]

## 2017-01-29 NOTE — Telephone Encounter (Signed)
Left message x 2. JSY 

## 2017-01-30 ENCOUNTER — Encounter: Payer: Self-pay | Admitting: Obstetrics & Gynecology

## 2017-01-30 ENCOUNTER — Telehealth: Payer: Self-pay | Admitting: *Deleted

## 2017-01-30 ENCOUNTER — Ambulatory Visit (INDEPENDENT_AMBULATORY_CARE_PROVIDER_SITE_OTHER): Payer: Medicare Other | Admitting: Obstetrics & Gynecology

## 2017-01-30 VITALS — BP 122/78 | HR 58 | Ht 64.0 in | Wt 181.0 lb

## 2017-01-30 DIAGNOSIS — Z9189 Other specified personal risk factors, not elsewhere classified: Secondary | ICD-10-CM

## 2017-01-30 DIAGNOSIS — Z01419 Encounter for gynecological examination (general) (routine) without abnormal findings: Secondary | ICD-10-CM

## 2017-01-30 MED ORDER — PREGABALIN 300 MG PO CAPS: 300.0000 mg | ORAL_CAPSULE | Freq: Two times a day (BID) | ORAL | 1 refills | Status: DC

## 2017-01-30 MED ORDER — ESTRADIOL 2 MG PO TABS: 2.0000 mg | ORAL_TABLET | Freq: Every day | ORAL | 11 refills | Status: AC

## 2017-01-30 NOTE — Telephone Encounter (Signed)
Patient was seen today but states she forgot to ask you for a refill on her pain medication. Please advise.

## 2017-01-30 NOTE — Progress Notes (Signed)
Subjective:     Lynn Logan is a 46 y.o. female here for a routine exam.  Patient's last menstrual period was 09/12/2015 (exact date). G3P3 Birth Control Method:  hysterectomy Menstrual Calendar(currently): amenorrheic  Current complaints: colon cancer, ASCVD.   Current acute medical issues:  Colon cancer   Recent Gynecologic History Patient's last menstrual period was 09/12/2015 (exact date). Last Pap: 2016,  normal Last mammogram: 03/08/2016,  normal  Past Medical History:  Diagnosis Date  . Anxiety   . Arthritis    Rheumatoid  . Asthma   . Breast cancer ([place])   . CHF (congestive heart failure) ([place])   . Chronic back pain   . Colon cancer ([place])   . Depression   . Fibromyalgia   . Gout   . Right lumbar radiculopathy   . Vitiligo    face    Past Surgical History:  Procedure Laterality Date  . CESAREAN SECTION    . DILITATION & CURRETTAGE/HYSTROSCOPY WITH NOVASURE ABLATION N/A 07/07/2015   Procedure: HYSTEROSCOPY, UTERINE CURETTAGE, ENDOMETRIAL  ABLATION Uterine Cavity Length=6.5cm Uterine Cavity Width=4.5cm Power=161 Watts Time=1 minute 19 seconds;  Surgeon: Florian Buff, MD;  Location: [facility];  Service: Gynecology;  Laterality: N/A;  . POLYPECTOMY  07/07/2015   Procedure: POLYPECTOMY;  Surgeon: Florian Buff, MD;  Location: [facility];  Service: Gynecology;;  . SALPINGOOPHORECTOMY Bilateral 10/13/2015   Procedure: BILATERAL SALPINGO OOPHORECTOMY;  Surgeon: Florian Buff, MD;  Location: [facility];  Service: Gynecology;  Laterality: Bilateral;  . TUBAL LIGATION    . VAGINAL HYSTERECTOMY N/A 10/13/2015   Procedure: HYSTERECTOMY VAGINAL;  Surgeon: Florian Buff, MD;  Location: [facility];  Service: Gynecology;  Laterality: N/A;    OB History    Gravida Para Term Preterm AB Living   3 3           SAB TAB Ectopic Multiple Live Births                  Social History   Social History  . Marital status: Divorced    Spouse name: N/A  . Number of children: N/A  . Years of  education: N/A   Social History Main Topics  . Smoking status: Heavy Tobacco Smoker    Packs/day: 1.00    Years: 30.00    Types: Cigarettes  . Smokeless tobacco: Former Systems developer  . Alcohol use No  . Drug use: No     Comment: Hx of marijuana use - None now  . Sexual activity: Not Currently    Birth control/ protection: Surgical   Other Topics Concern  . None   Social History Narrative  . None    Family History  Problem Relation Age of Onset  . Diabetes Mother   . Congestive Heart Failure Mother   . Depression Father   . Hypertension Father   . Cancer Father   . Heart disease Maternal Uncle      Current Outpatient Prescriptions:  .  albuterol (PROVENTIL HFA;VENTOLIN HFA) 108 (90 Base) MCG/ACT inhaler, Inhale 2 puffs into the lungs every 6 (six) hours as needed., Disp: , Rfl:  .  albuterol (PROVENTIL) (2.5 MG/3ML) 0.083% nebulizer solution, Take 3 mLs (2.5 mg total) by nebulization every 6 (six) hours as needed for wheezing or shortness of breath., Disp: 150 mL, Rfl: 1 .  budesonide-formoterol (SYMBICORT) 160-4.5 MCG/ACT inhaler, Inhale 2 puffs into the lungs 2 (two) times daily., Disp: , Rfl:  .  diclofenac sodium (VOLTAREN) 1 %  GEL, Apply 2 g topically 4 (four) times daily., Disp: 100 g, Rfl: 2 .  DULoxetine (CYMBALTA) 60 MG capsule, TAKE ONE CAPSULE BY MOUTH ONCE DAILY, Disp: 30 capsule, Rfl: 5 .  ergocalciferol (VITAMIN D2) 50000 units capsule, Take 1 capsule by mouth once a week., Disp: , Rfl:  .  estradiol (ESTRACE) 2 MG tablet, Take 1 tablet (2 mg total) by mouth daily., Disp: 30 tablet, Rfl: 11 .  ferrous sulfate 324 (65 Fe) MG TBEC, Take 1 tablet (325 mg total) by mouth daily., Disp: 30 tablet, Rfl: 3 .  HYDROcodone-acetaminophen (NORCO/VICODIN) 5-325 MG tablet, Take 1 tablet by mouth every 6 (six) hours as needed for moderate pain (Must last 30 days.Do not take and drive a car or use machinery.)., Disp: 50 tablet, Rfl: 0 .  hydrOXYzine (ATARAX/VISTARIL) 25 MG tablet,  Take 1 tablet by mouth at bedtime., Disp: , Rfl:  .  megestrol (MEGACE) 40 MG tablet, Take 1 tablet (40 mg total) by mouth daily., Disp: 90 tablet, Rfl: 3 .  piroxicam (FELDENE) 20 MG capsule, Take 1 capsule (20 mg total) by mouth daily., Disp: 30 capsule, Rfl: 2 .  polyethylene glycol powder (GLYCOLAX/MIRALAX) powder, Take 17 g by mouth 2 (two) times daily as needed., Disp: 3350 g, Rfl: 1 .  potassium chloride SA (K-DUR,KLOR-CON) 20 MEQ tablet, Take 2 tablets (40 mEq total) by mouth 2 (two) times daily., Disp: 28 tablet, Rfl: 0 .  RaNITidine HCl (ZANTAC PO), Take by mouth., Disp: , Rfl:  .  citalopram (CELEXA) 20 MG tablet, Take 1 tablet by mouth daily., Disp: , Rfl:  .  naproxen (NAPROSYN) 500 MG tablet, Take 1 tablet (500 mg total) by mouth 2 (two) times daily with a meal. (Patient not taking: Reported on 01/30/2017), Disp: 60 tablet, Rfl: 5 .  pregabalin (LYRICA) 300 MG capsule, Take 1 capsule (300 mg total) by mouth 2 (two) times daily., Disp: 60 capsule, Rfl: 1  Review of Systems  Review of Systems  Constitutional: Negative for fever, chills, weight loss, malaise/fatigue and diaphoresis.  HENT: Negative for hearing loss, ear pain, nosebleeds, congestion, sore throat, neck pain, tinnitus and ear discharge.   Eyes: Negative for blurred vision, double vision, photophobia, pain, discharge and redness.  Respiratory: Negative for cough, hemoptysis, sputum production, shortness of breath, wheezing and stridor.   Cardiovascular: Negative for chest pain, palpitations, orthopnea, claudication, leg swelling and PND.  Gastrointestinal: negative for abdominal pain. Negative for heartburn, nausea, vomiting, diarrhea, constipation, blood in stool and melena.  Genitourinary: Negative for dysuria, urgency, frequency, hematuria and flank pain.  Musculoskeletal: Negative for myalgias, back pain, joint pain and falls.  Skin: Negative for itching and rash.  Neurological: Negative for dizziness, tingling,  tremors, sensory change, speech change, focal weakness, seizures, loss of consciousness, weakness and headaches.  Endo/Heme/Allergies: Negative for environmental allergies and polydipsia. Does not bruise/bleed easily.  Psychiatric/Behavioral: Negative for depression, suicidal ideas, hallucinations, memory loss and substance abuse. The patient is not nervous/anxious and does not have insomnia.        Objective:  Blood pressure 122/78, pulse (!) 58, height 5\' 4"  (1.626 m), weight 181 lb (82.1 kg), last menstrual period 09/12/2015.   Physical Exam  Vitals reviewed. Constitutional: She is oriented to person, place, and time. She appears well-developed and well-nourished.  HENT:  Head: Normocephalic and atraumatic.        Right Ear: External ear normal.  Left Ear: External ear normal.  Nose: Nose normal.  Mouth/Throat: Oropharynx is clear and  moist.  Eyes: Conjunctivae and EOM are normal. Pupils are equal, round, and reactive to light. Right eye exhibits no discharge. Left eye exhibits no discharge. No scleral icterus.  Neck: Normal range of motion. Neck supple. No tracheal deviation present. No thyromegaly present.  Cardiovascular: Normal rate, regular rhythm, normal heart sounds and intact distal pulses.  Exam reveals no gallop and no friction rub.   No murmur heard. Respiratory: Effort normal and breath sounds normal. No respiratory distress. She has no wheezes. She has no rales. She exhibits no tenderness.  GI: Soft. Bowel sounds are normal. She exhibits no distension and no mass. There is no tenderness. There is no rebound and no guarding.  Genitourinary:  Breasts no masses skin changes or nipple changes bilaterally      Vulva is normal without lesions Vagina is pink moist without discharge Cervix absent Uterus is absent Adnexa is negative {Rectal   deferred Musculoskeletal: Normal range of motion. She exhibits no edema and no tenderness.  Neurological: She is alert and oriented to  person, place, and time. She has normal reflexes. She displays normal reflexes. No cranial nerve deficit. She exhibits normal muscle tone. Coordination normal.  Skin: Skin is warm and dry. No rash noted. No erythema. No pallor.  Psychiatric: She has a normal mood and affect. Her behavior is normal. Judgment and thought content normal.       Medications Ordered at today's visit: Meds ordered this encounter  Medications  . RaNITidine HCl (ZANTAC PO)    Sig: Take by mouth.  . pregabalin (LYRICA) 300 MG capsule    Sig: Take 1 capsule (300 mg total) by mouth 2 (two) times daily.    Dispense:  60 capsule    Refill:  1    Other orders placed at today's visit: No orders of the defined types were placed in this encounter.     Assessment:     normal gyn exam.    Plan:    Hormone replacement therapy: hormone replacement therapy: estrace 2 mg. Mammogram ordered. Follow up in: 1 year.     No Follow-up on file.

## 2017-01-31 NOTE — Telephone Encounter (Signed)
Informed patient it was Dr Nevada Crane not Dr Luan Pulling and she should not need a referral. Phone number given to practice. Verbalized understanding.

## 2017-01-31 NOTE — Telephone Encounter (Signed)
Informed patient about refill not being sent. Patient asked about Dr Luan Pulling referral. I do not see any mention about it in her last visit note. Please advise.

## 2017-01-31 NOTE — Telephone Encounter (Signed)
Not Dr Luan Pulling, Dr Nevada Crane She does not need a referral she is seeking a new PCP

## 2017-01-31 NOTE — Telephone Encounter (Signed)
I do not manage her pain so, no   I refilled her lyrica as a favor

## 2017-02-01 NOTE — Telephone Encounter (Signed)
Patient has been dismissed.

## 2017-02-02 ENCOUNTER — Telehealth: Payer: Self-pay | Admitting: Obstetrics & Gynecology

## 2017-02-02 ENCOUNTER — Other Ambulatory Visit: Payer: Self-pay | Admitting: *Deleted

## 2017-02-02 NOTE — Telephone Encounter (Signed)
Nope I don't treat gout

## 2017-02-05 DIAGNOSIS — R1031 Right lower quadrant pain: Secondary | ICD-10-CM | POA: Diagnosis not present

## 2017-02-05 DIAGNOSIS — R0602 Shortness of breath: Secondary | ICD-10-CM | POA: Diagnosis not present

## 2017-02-05 DIAGNOSIS — J069 Acute upper respiratory infection, unspecified: Secondary | ICD-10-CM | POA: Diagnosis not present

## 2017-02-05 DIAGNOSIS — Z7951 Long term (current) use of inhaled steroids: Secondary | ICD-10-CM | POA: Diagnosis not present

## 2017-02-05 DIAGNOSIS — R0789 Other chest pain: Secondary | ICD-10-CM | POA: Diagnosis not present

## 2017-02-05 DIAGNOSIS — J45909 Unspecified asthma, uncomplicated: Secondary | ICD-10-CM | POA: Diagnosis not present

## 2017-02-05 DIAGNOSIS — R509 Fever, unspecified: Secondary | ICD-10-CM | POA: Diagnosis not present

## 2017-02-05 DIAGNOSIS — I4891 Unspecified atrial fibrillation: Secondary | ICD-10-CM | POA: Diagnosis not present

## 2017-02-05 DIAGNOSIS — I252 Old myocardial infarction: Secondary | ICD-10-CM | POA: Diagnosis not present

## 2017-02-05 DIAGNOSIS — I509 Heart failure, unspecified: Secondary | ICD-10-CM | POA: Diagnosis not present

## 2017-02-05 DIAGNOSIS — R319 Hematuria, unspecified: Secondary | ICD-10-CM | POA: Diagnosis not present

## 2017-02-05 DIAGNOSIS — Z79899 Other long term (current) drug therapy: Secondary | ICD-10-CM | POA: Diagnosis not present

## 2017-02-06 DIAGNOSIS — M797 Fibromyalgia: Secondary | ICD-10-CM | POA: Diagnosis not present

## 2017-02-06 DIAGNOSIS — J449 Chronic obstructive pulmonary disease, unspecified: Secondary | ICD-10-CM | POA: Diagnosis not present

## 2017-02-06 DIAGNOSIS — I10 Essential (primary) hypertension: Secondary | ICD-10-CM | POA: Diagnosis not present

## 2017-02-06 DIAGNOSIS — R3129 Other microscopic hematuria: Secondary | ICD-10-CM | POA: Diagnosis not present

## 2017-02-06 DIAGNOSIS — J209 Acute bronchitis, unspecified: Secondary | ICD-10-CM | POA: Diagnosis not present

## 2017-02-06 DIAGNOSIS — R319 Hematuria, unspecified: Secondary | ICD-10-CM | POA: Diagnosis not present

## 2017-02-06 DIAGNOSIS — M2550 Pain in unspecified joint: Secondary | ICD-10-CM | POA: Diagnosis not present

## 2017-02-06 DIAGNOSIS — G8929 Other chronic pain: Secondary | ICD-10-CM | POA: Diagnosis not present

## 2017-02-07 ENCOUNTER — Telehealth: Payer: Self-pay | Admitting: *Deleted

## 2017-02-07 DIAGNOSIS — I5030 Unspecified diastolic (congestive) heart failure: Secondary | ICD-10-CM | POA: Diagnosis not present

## 2017-02-07 NOTE — Telephone Encounter (Signed)
Informed patient that she needed to go to her primary care doctor for gout prescription. Verbalized understanding.

## 2017-02-12 ENCOUNTER — Other Ambulatory Visit: Payer: Self-pay | Admitting: Orthopaedic Surgery

## 2017-02-15 ENCOUNTER — Encounter: Payer: Self-pay | Admitting: Orthopaedic Surgery

## 2017-02-15 ENCOUNTER — Ambulatory Visit (INDEPENDENT_AMBULATORY_CARE_PROVIDER_SITE_OTHER): Payer: Medicare Other | Admitting: Orthopaedic Surgery

## 2017-02-15 VITALS — BP 144/98 | HR 86 | Temp 97.9°F | Ht 64.0 in | Wt 182.0 lb

## 2017-02-15 DIAGNOSIS — G8929 Other chronic pain: Secondary | ICD-10-CM | POA: Diagnosis not present

## 2017-02-15 DIAGNOSIS — M25561 Pain in right knee: Secondary | ICD-10-CM

## 2017-02-15 MED ORDER — HYDROCODONE-ACETAMINOPHEN 5-325 MG PO TABS: 1.0000 | ORAL_TABLET | Freq: Four times a day (QID) | ORAL | 0 refills | Status: DC | PRN

## 2017-02-15 NOTE — Patient Instructions (Signed)
Steps to Quit Smoking Smoking tobacco can be bad for your health. It can also affect almost every organ in your body. Smoking puts you and people around you at risk for many serious long-lasting (chronic) diseases. Quitting smoking is hard, but it is one of the best things that you can do for your health. It is never too late to quit. What are the benefits of quitting smoking? When you quit smoking, you lower your risk for getting serious diseases and conditions. They can include:  Lung cancer or lung disease.  Heart disease.  Stroke.  Heart attack.  Not being able to have children (infertility).  Weak bones (osteoporosis) and broken bones (fractures).  If you have coughing, wheezing, and shortness of breath, those symptoms may get better when you quit. You may also get sick less often. If you are pregnant, quitting smoking can help to lower your chances of having a baby of low birth weight. What can I do to help me quit smoking? Talk with your doctor about what can help you quit smoking. Some things you can do (strategies) include:  Quitting smoking totally, instead of slowly cutting back how much you smoke over a period of time.  Going to in-person counseling. You are more likely to quit if you go to many counseling sessions.  Using resources and support systems, such as: ? Online chats with a counselor. ? Phone quitlines. ? Printed self-help materials. ? Support groups or group counseling. ? Text messaging programs. ? Mobile phone apps or applications.  Taking medicines. Some of these medicines may have nicotine in them. If you are pregnant or breastfeeding, do not take any medicines to quit smoking unless your doctor says it is okay. Talk with your doctor about counseling or other things that can help you.  Talk with your doctor about using more than one strategy at the same time, such as taking medicines while you are also going to in-person counseling. This can help make  quitting easier. What things can I do to make it easier to quit? Quitting smoking might feel very hard at first, but there is a lot that you can do to make it easier. Take these steps:  Talk to your family and friends. Ask them to support and encourage you.  Call phone quitlines, reach out to support groups, or work with a counselor.  Ask people who smoke to not smoke around you.  Avoid places that make you want (trigger) to smoke, such as: ? Bars. ? Parties. ? Smoke-break areas at work.  Spend time with people who do not smoke.  Lower the stress in your life. Stress can make you want to smoke. Try these things to help your stress: ? Getting regular exercise. ? Deep-breathing exercises. ? Yoga. ? Meditating. ? Doing a body scan. To do this, close your eyes, focus on one area of your body at a time from head to toe, and notice which parts of your body are tense. Try to relax the muscles in those areas.  Download or buy apps on your mobile phone or tablet that can help you stick to your quit plan. There are many free apps, such as QuitGuide from the CDC (Centers for Disease Control and Prevention). You can find more support from smokefree.gov and other websites.  This information is not intended to replace advice given to you by your health care provider. Make sure you discuss any questions you have with your health care provider. Document Released: 06/24/2009 Document   Revised: 04/25/2016 Document Reviewed: 01/12/2015 Elsevier Interactive Patient Education  2018 Elsevier Inc.  

## 2017-02-15 NOTE — Progress Notes (Signed)
CC:  I have pain of my right knee. I would like an injection.  The patient has chronic pain of the right knee.  There is no recent trauma.  There is no redness.  Injections in the past have helped.  The knee has no redness, has an effusion and crepitus present.  ROM of the right knee is 0-105.  Impression:  Chronic knee pain right  Return: 1 month  PROCEDURE NOTE:  The patient requests injections of the right knee , verbal consent was obtained.  The right knee was prepped appropriately after time out was performed.   Sterile technique was observed and injection of 1 cc of Depo-Medrol 40 mg with several cc's of plain xylocaine. Anesthesia was provided by ethyl chloride and a 20-gauge needle was used to inject the knee area. The injection was tolerated well.  A band aid dressing was applied.  The patient was advised to apply ice later today and tomorrow to the injection sight as needed.  I have reviewed the [place] web site prior to prescribing narcotic medicine for this patient.  Electronically Signed Sanjuana Kava, MD 6/7/20188:27 AM

## 2017-02-19 ENCOUNTER — Telehealth: Payer: Self-pay | Admitting: Obstetrics & Gynecology

## 2017-02-19 NOTE — Telephone Encounter (Signed)
Patient would like the results of her pap from last visit. Please call patient back

## 2017-02-19 NOTE — Telephone Encounter (Signed)
Patient informed pap smear was not performed because she has had a hysterectomy. Verbalized understanding.

## 2017-02-21 ENCOUNTER — Other Ambulatory Visit: Payer: Self-pay | Admitting: Radiology

## 2017-02-21 ENCOUNTER — Telehealth: Payer: Self-pay | Admitting: Orthopaedic Surgery

## 2017-02-21 DIAGNOSIS — G8929 Other chronic pain: Secondary | ICD-10-CM

## 2017-02-21 DIAGNOSIS — M25561 Pain in right knee: Principal | ICD-10-CM

## 2017-02-21 NOTE — Telephone Encounter (Addendum)
Patient came by stating she wants to have Dr. Luna Glasgow give approval for her to have PT?  Can this be ordered for her?  Lynn Logan would please send order for PT to [facility] in [place]?  Thanks

## 2017-02-21 NOTE — Telephone Encounter (Signed)
Yes, you can order this.  Thanks.

## 2017-02-28 ENCOUNTER — Emergency Department (HOSPITAL_COMMUNITY)
Admission: EM | Admit: 2017-02-28 | Discharge: 2017-02-28 | Disposition: A | Discharge status: Home / Self Care | Payer: Medicare Other | Attending: Emergency Medicine | Admitting: Emergency Medicine

## 2017-02-28 ENCOUNTER — Encounter (HOSPITAL_COMMUNITY): Payer: Self-pay | Admitting: Emergency Medicine

## 2017-02-28 ENCOUNTER — Emergency Department (HOSPITAL_COMMUNITY): Payer: Medicare Other

## 2017-02-28 DIAGNOSIS — F17210 Nicotine dependence, cigarettes, uncomplicated: Secondary | ICD-10-CM | POA: Insufficient documentation

## 2017-02-28 DIAGNOSIS — J449 Chronic obstructive pulmonary disease, unspecified: Secondary | ICD-10-CM | POA: Insufficient documentation

## 2017-02-28 DIAGNOSIS — Z853 Personal history of malignant neoplasm of breast: Secondary | ICD-10-CM | POA: Diagnosis not present

## 2017-02-28 DIAGNOSIS — Z7951 Long term (current) use of inhaled steroids: Secondary | ICD-10-CM | POA: Insufficient documentation

## 2017-02-28 DIAGNOSIS — J45909 Unspecified asthma, uncomplicated: Secondary | ICD-10-CM | POA: Diagnosis not present

## 2017-02-28 DIAGNOSIS — I5030 Unspecified diastolic (congestive) heart failure: Secondary | ICD-10-CM | POA: Diagnosis not present

## 2017-02-28 DIAGNOSIS — R0789 Other chest pain: Secondary | ICD-10-CM | POA: Diagnosis not present

## 2017-02-28 DIAGNOSIS — R079 Chest pain, unspecified: Secondary | ICD-10-CM | POA: Diagnosis present

## 2017-02-28 HISTORY — DX: Chronic obstructive pulmonary disease, unspecified: J44.9

## 2017-02-28 HISTORY — DX: Acute myocardial infarction, unspecified: I21.9

## 2017-02-28 LAB — CBC
HCT: 41.7 % (ref 36.0–46.0)
Hemoglobin: 13.7 g/dL (ref 12.0–15.0)
MCH: 25.1 pg — ABNORMAL LOW (ref 26.0–34.0)
MCHC: 32.9 g/dL (ref 30.0–36.0)
MCV: 76.4 fL — AB (ref 78.0–100.0)
Platelets: 275 10*3/uL (ref 150–400)
RBC: 5.46 MIL/uL — ABNORMAL HIGH (ref 3.87–5.11)
RDW: 15.6 % — ABNORMAL HIGH (ref 11.5–15.5)
WBC: 13.2 10*3/uL — AB (ref 4.0–10.5)

## 2017-02-28 LAB — BASIC METABOLIC PANEL
Anion gap: 9 (ref 5–15)
BUN: 16 mg/dL (ref 6–20)
CO2: 22 mmol/L (ref 22–32)
Calcium: 8.8 mg/dL — ABNORMAL LOW (ref 8.9–10.3)
Chloride: 108 mmol/L (ref 101–111)
Creatinine, Ser: 0.98 mg/dL (ref 0.44–1.00)
GFR calc Af Amer: >60 mL/min (ref >60)
Glucose, Bld: 113 mg/dL — ABNORMAL HIGH (ref 65–99)
Potassium: 3.7 mmol/L (ref 3.5–5.1)
SODIUM: 139 mmol/L (ref 135–145)

## 2017-02-28 LAB — I-STAT TROPONIN, ED: Troponin i, poc: 0 ng/mL (ref 0.00–0.08)

## 2017-02-28 LAB — D-DIMER, QUANTITATIVE (NOT AT ARMC)

## 2017-02-28 MED ORDER — IBUPROFEN 400 MG PO TABS
600.0000 mg | ORAL_TABLET | Freq: Once | ORAL | Status: AC
Start: 2017-02-28 — End: 2017-02-28
  Administered 2017-02-28: 600 mg via ORAL
  Filled 2017-02-28: qty 2

## 2017-02-28 NOTE — ED Provider Notes (Signed)
[facility] DEPT Provider Note   CSN: [id number] Arrival date & time: 02/28/17  1234     History   Chief Complaint Chief Complaint  Patient presents with  . Chest Pain    HPI Lynn Logan is a 46 y.o. female.  HPI  46 year old female presents with chest pain and shortness of breath. She states she always has some degree of shortness of breath from her COPD and smoking but it seems worse once his chest pain started. It started about one hour prior to arrival. She was digging and pulling up plans in her garden when this started. The pain is sharp and just right of midline and goes just left of midline. Worsens with inspiration as well as twisting certain ways. Chronic cough, seems a little worse over the last couple days. No leg swelling. She is on estrogen due to prior hysterectomy. She also has an appointment with [facility] next month for evaluation of newly diagnosed breast cancer. She has not taken anything for the chest pain. She states she was diagnosed with a heart attack last year at [facility]. She states she did not have a heart catheterization. She has nitroglycerin from that same visit but did not take it because she thinks is expired. She notes an allergy to toradol but states ibuprofen is ok for her.  Past Medical History:  Diagnosis Date  . Anxiety   . Arthritis    Rheumatoid  . Asthma   . Breast cancer ([place])   . CHF (congestive heart failure) ([place])   . Chronic back pain   . Colon cancer ([place])   . COPD (chronic obstructive pulmonary disease) ([place])   . Depression   . Fibromyalgia   . Gout   . Heart attack ([place]) 2017  . Right lumbar radiculopathy   . Vitiligo    face    Patient Active Problem List   Diagnosis Date Noted  . Hypersomnia 05/11/2016  . COPD (chronic obstructive pulmonary disease) ([place]) 05/11/2016  . Tobacco user 05/11/2016  . Diastolic CHF ([place]) 37/16/9678  . History of orthopnea 04/06/2016  . Gall bladder polyp 03/09/2016  .  Reactive airway disease 02/21/2016  . Healthcare maintenance 02/10/2016  . Arthritis of knee 01/24/2016  . Microcytic anemia 12/31/2015  . Obesity 11/26/2015  . Fibromyalgia 11/26/2015  . Stress incontinence 11/26/2015  . Right knee pain 11/16/2015  . Left knee pain 11/16/2015  . Knee pain 11/09/2015  . S/P vaginal hysterectomy 10/13/2015  . Right arm pain 06/24/2015  . Right leg pain 06/24/2015  . Back pain 06/11/2015  . Menorrhagia 05/21/2015  . Mallory-Weiss tear 05/21/2015  . Peripheral neuropathy 05/21/2015  . Depression 05/21/2015    Past Surgical History:  Procedure Laterality Date  . CESAREAN SECTION    . DILITATION & CURRETTAGE/HYSTROSCOPY WITH NOVASURE ABLATION N/A 07/07/2015   Procedure: HYSTEROSCOPY, UTERINE CURETTAGE, ENDOMETRIAL  ABLATION Uterine Cavity Length=6.5cm Uterine Cavity Width=4.5cm Power=161 Watts Time=1 minute 19 seconds;  Surgeon: Florian Buff, MD;  Location: [facility];  Service: Gynecology;  Laterality: N/A;  . POLYPECTOMY  07/07/2015   Procedure: POLYPECTOMY;  Surgeon: Florian Buff, MD;  Location: [facility];  Service: Gynecology;;  . SALPINGOOPHORECTOMY Bilateral 10/13/2015   Procedure: BILATERAL SALPINGO OOPHORECTOMY;  Surgeon: Florian Buff, MD;  Location: [facility];  Service: Gynecology;  Laterality: Bilateral;  . TUBAL LIGATION    . VAGINAL HYSTERECTOMY N/A 10/13/2015   Procedure: HYSTERECTOMY VAGINAL;  Surgeon: Florian Buff, MD;  Location: [facility];  Service:  Gynecology;  Laterality: N/A;    OB History    Gravida Para Term Preterm AB Living   3 3           SAB TAB Ectopic Multiple Live Births                   Home Medications    Prior to Admission medications   Medication Sig Start Date End Date Taking? Authorizing Provider  albuterol (PROVENTIL HFA;VENTOLIN HFA) 108 (90 Base) MCG/ACT inhaler Inhale 2 puffs into the lungs every 6 (six) hours as needed. 05/08/16 05/08/17 Yes [provider]  albuterol (PROVENTIL) (2.5 MG/3ML) 0.083%  nebulizer solution Take 3 mLs (2.5 mg total) by nebulization every 6 (six) hours as needed for wheezing or shortness of breath. 03/29/16  Yes Eustaquio Maize, MD  budesonide-formoterol [facility]) 160-4.5 MCG/ACT inhaler Inhale 2 puffs into the lungs 2 (two) times daily. 05/08/16 05/08/17 Yes [provider]  citalopram (CELEXA) 20 MG tablet Take 1 tablet by mouth daily. 05/08/16 05/08/17 Yes [provider]  clonazePAM (KLONOPIN) 0.5 MG tablet Take 1 tablet by mouth 3 (three) times daily. 01/26/17  Yes [provider]  cyclobenzaprine (FLEXERIL) 10 MG tablet Take 1 tablet by mouth at bedtime. 02/12/17  Yes [provider]  DULoxetine (CYMBALTA) 60 MG capsule TAKE ONE CAPSULE BY MOUTH ONCE DAILY 12/21/15  Yes Timmothy Euler, MD  ergocalciferol (VITAMIN D2) 50000 units capsule Take 1 capsule by mouth once a week. 05/09/16 05/09/17 Yes [provider]  estradiol (ESTRACE) 2 MG tablet Take 1 tablet (2 mg total) by mouth daily. 01/30/17  Yes Florian Buff, MD  ferrous sulfate 324 (65 Fe) MG TBEC Take 1 tablet (325 mg total) by mouth daily. 01/06/16  Yes Timmothy Euler, MD  HYDROcodone-acetaminophen (NORCO/VICODIN) 5-325 MG tablet Take 1 tablet by mouth every 6 (six) hours as needed for moderate pain (Must last 30 days.Do not take and drive a car or use machinery.). 02/15/17  Yes Sanjuana Kava, MD  hydrOXYzine (ATARAX/VISTARIL) 25 MG tablet Take 1 tablet by mouth at bedtime.   Yes [provider]  megestrol (MEGACE) 40 MG tablet Take 1 tablet (40 mg total) by mouth daily. 03/08/16  Yes Dettinger, Fransisca Kaufmann, MD  naproxen (NAPROSYN) 500 MG tablet Take 1 tablet (500 mg total) by mouth 2 (two) times daily with a meal. 12/21/16  Yes Sanjuana Kava, MD  piroxicam (FELDENE) 20 MG capsule Take 1 capsule (20 mg total) by mouth daily. 05/23/16  Yes Florian Buff, MD  polyethylene glycol powder (GLYCOLAX/MIRALAX) powder Take 17 g by mouth 2 (two) times daily as needed.  03/29/16  Yes Eustaquio Maize, MD  potassium chloride SA (K-DUR,KLOR-CON) 20 MEQ tablet Take 2 tablets (40 mEq total) by mouth 2 (two) times daily. 03/25/16  Yes Mesner, Corene Cornea, MD  predniSONE (DELTASONE) 20 MG tablet Take 2 tablets by mouth daily. 02/21/17  Yes [provider]  pregabalin (LYRICA) 300 MG capsule Take 1 capsule (300 mg total) by mouth 2 (two) times daily. 01/30/17  Yes Florian Buff, MD  RaNITidine HCl (ZANTAC PO) Take 75 mg by mouth 2 (two) times daily.    Yes [provider]  diclofenac sodium (VOLTAREN) 1 % GEL Apply 2 g topically 4 (four) times daily. Patient not taking: Reported on 02/28/2017 06/24/15   Timmothy Euler, MD    Family History Family History  Problem Relation Age of Onset  . Diabetes Mother   . Congestive  Heart Failure Mother   . Depression Father   . Hypertension Father   . Cancer Father   . Heart disease Maternal Uncle     Social History Social History  Substance Use Topics  . Smoking status: Heavy Tobacco Smoker    Packs/day: 1.00    Years: 30.00    Types: Cigarettes  . Smokeless tobacco: Former Systems developer  . Alcohol use No     Allergies   Toradol [ketorolac tromethamine]; Buspirone; Lyrica [pregabalin]; Gabapentin; Tramadol; Zofran [ondansetron hcl]; and Penicillins   Review of Systems Review of Systems  Constitutional: Negative for fever.  Respiratory: Positive for cough and shortness of breath.   Cardiovascular: Positive for chest pain. Negative for leg swelling.  Gastrointestinal: Negative for vomiting.  All other systems reviewed and are negative.    Physical Exam Updated Vital Signs BP 118/74   Pulse 80   Temp 97.9 F (36.6 C) (Oral)   Resp 18   Ht 5\' 4"  (1.626 m)   Wt 81.6 kg (180 lb)   LMP 09/12/2015 (Exact Date)   SpO2 100%   BMI 30.90 kg/m   Physical Exam  Constitutional: She is oriented to person, place, and time. She appears well-developed and well-nourished. No distress.  HENT:  Head:  Normocephalic and atraumatic.  Right Ear: External ear normal.  Left Ear: External ear normal.  Nose: Nose normal.  Eyes: Right eye exhibits no discharge. Left eye exhibits no discharge.  Cardiovascular: Normal rate, regular rhythm and normal heart sounds.   Pulmonary/Chest: Effort normal and breath sounds normal. She exhibits tenderness.    Abdominal: Soft. There is no tenderness.  Neurological: She is alert and oriented to person, place, and time.  Skin: Skin is warm and dry. She is not diaphoretic.  Nursing note and vitals reviewed.    ED Treatments / Results  Labs (all labs ordered are listed, but only abnormal results are displayed) Labs Reviewed  BASIC METABOLIC PANEL - Abnormal; Notable for the following:       Result Value   Glucose, Bld 113 (*)    Calcium 8.8 (*)    All other components within normal limits  CBC - Abnormal; Notable for the following:    WBC 13.2 (*)    RBC 5.46 (*)    MCV 76.4 (*)    MCH 25.1 (*)    RDW 15.6 (*)    All other components within normal limits  D-DIMER, QUANTITATIVE (NOT AT [facility])  I-STAT TROPOININ, ED    EKG  EKG Interpretation  Date/Time:  Wednesday February 28 2017 12:44:01 EDT Ventricular Rate:  78 PR Interval:    QRS Duration: 101 QT Interval:  376 QTC Calculation: 429 R Axis:   23 Text Interpretation:  Sinus rhythm nonspecific T waves less prominent when compared to July 2017 Confirmed by Sherwood Gambler [phone]) on 02/28/2017 12:52:35 PM       Radiology Dg Chest 2 View  Result Date: 02/28/2017 CLINICAL DATA:  Chest pain with shortness of breath. History of breast and colon carcinoma EXAM: CHEST  2 VIEW COMPARISON:  March 24, 2016 FINDINGS: Lungs are clear. Heart size and pulmonary vascularity are normal. No adenopathy. No pneumothorax. No bone lesions. IMPRESSION: No edema or consolidation. Electronically Signed   By: Lowella Grip III M.D.   On: 02/28/2017 13:11    Procedures Procedures (including critical care  time)  Medications Ordered in ED Medications  ibuprofen (ADVIL,MOTRIN) tablet 600 mg (600 mg Oral Given 02/28/17 1327)  Initial Impression / Assessment and Plan / ED Course  I have reviewed the triage vital signs and the nursing notes.  Pertinent labs & imaging results that were available during my care of the patient were reviewed by me and considered in my medical decision making (see chart for details).     While workup was pending, patient asks to leave. She says her ride is leaving and wants to go now, despite workup in process. I think most likely this is chest wall pain but she has some risk factors for PE. This would be atypical but deserves workup. Also a history of MI per her. I think this is less likely but deserves serial ECGs and troponins. Likely would have been discharged after this. Discussed why I wanted her to stay and potential risk of complications if she leaves, such as MI, PE, death, morbidity. She understands this and understands risks. Appears to be capable of medical decision making and advised and encouraged to return Final Clinical Impressions(s) / ED Diagnoses   Final diagnoses:  Chest wall pain    New Prescriptions New Prescriptions   No medications on file     Sherwood Gambler, MD 02/28/17 1704

## 2017-02-28 NOTE — Discharge Instructions (Signed)
You are signing out [place]. We discussed that your workup for your chest pain is not complete and are still concerning possible causes such as heart attack and blood clots. If you change your mind, you encouraged to come back at any time to be evaluated. Otherwise please follow-up as soon as possible in the next 1-2 days with your doctor. If you develop any worsening of symptoms or any new concerns, please return to the ER immediately.

## 2017-02-28 NOTE — ED Notes (Signed)
Patients family member is requesting pt be discharged because she has a sick man in the car.  Dr Regenia Skeeter informed.

## 2017-02-28 NOTE — ED Triage Notes (Signed)
Chest pain starting one hour ago. Pt c/o of sharp shooting pain in center of chest with SOB. Vomited once this morning. HX of MI

## 2017-03-07 ENCOUNTER — Emergency Department (HOSPITAL_COMMUNITY): Payer: Medicare Other

## 2017-03-07 ENCOUNTER — Encounter (HOSPITAL_COMMUNITY): Payer: Self-pay

## 2017-03-07 ENCOUNTER — Other Ambulatory Visit: Payer: Self-pay | Admitting: Orthopaedic Surgery

## 2017-03-07 ENCOUNTER — Emergency Department (HOSPITAL_COMMUNITY)
Admission: EM | Admit: 2017-03-07 | Discharge: 2017-03-07 | Disposition: A | Discharge status: Home / Self Care | Payer: Medicare Other | Attending: Emergency Medicine | Admitting: Emergency Medicine

## 2017-03-07 DIAGNOSIS — J45909 Unspecified asthma, uncomplicated: Secondary | ICD-10-CM | POA: Insufficient documentation

## 2017-03-07 DIAGNOSIS — I5030 Unspecified diastolic (congestive) heart failure: Secondary | ICD-10-CM | POA: Diagnosis not present

## 2017-03-07 DIAGNOSIS — R072 Precordial pain: Secondary | ICD-10-CM | POA: Insufficient documentation

## 2017-03-07 DIAGNOSIS — F17210 Nicotine dependence, cigarettes, uncomplicated: Secondary | ICD-10-CM | POA: Insufficient documentation

## 2017-03-07 DIAGNOSIS — R112 Nausea with vomiting, unspecified: Secondary | ICD-10-CM | POA: Insufficient documentation

## 2017-03-07 DIAGNOSIS — R1084 Generalized abdominal pain: Secondary | ICD-10-CM | POA: Insufficient documentation

## 2017-03-07 DIAGNOSIS — J449 Chronic obstructive pulmonary disease, unspecified: Secondary | ICD-10-CM | POA: Insufficient documentation

## 2017-03-07 DIAGNOSIS — R51 Headache: Secondary | ICD-10-CM | POA: Diagnosis not present

## 2017-03-07 DIAGNOSIS — Z853 Personal history of malignant neoplasm of breast: Secondary | ICD-10-CM | POA: Diagnosis not present

## 2017-03-07 DIAGNOSIS — R195 Other fecal abnormalities: Secondary | ICD-10-CM | POA: Insufficient documentation

## 2017-03-07 DIAGNOSIS — R0602 Shortness of breath: Secondary | ICD-10-CM | POA: Insufficient documentation

## 2017-03-07 DIAGNOSIS — R040 Epistaxis: Secondary | ICD-10-CM | POA: Diagnosis not present

## 2017-03-07 DIAGNOSIS — Z79899 Other long term (current) drug therapy: Secondary | ICD-10-CM | POA: Diagnosis not present

## 2017-03-07 HISTORY — DX: Disorder of kidney and ureter, unspecified: N28.9

## 2017-03-07 LAB — CBC WITH DIFFERENTIAL/PLATELET
BASOS PCT: 1 %
Basophils Absolute: 0.1 10*3/uL (ref 0.0–0.1)
EOS ABS: 0.2 10*3/uL (ref 0.0–0.7)
Eosinophils Relative: 2 %
HCT: 40.1 % (ref 36.0–46.0)
Hemoglobin: 13 g/dL (ref 12.0–15.0)
Lymphocytes Relative: 20 %
Lymphs Abs: 2.1 10*3/uL (ref 0.7–4.0)
MCH: 24.7 pg — ABNORMAL LOW (ref 26.0–34.0)
MCHC: 32.4 g/dL (ref 30.0–36.0)
MCV: 76.2 fL — ABNORMAL LOW (ref 78.0–100.0)
MONO ABS: 0.6 10*3/uL (ref 0.1–1.0)
MONOS PCT: 6 %
NEUTROS PCT: 71 %
Neutro Abs: 7.7 10*3/uL (ref 1.7–7.7)
PLATELETS: 253 10*3/uL (ref 150–400)
RBC: 5.26 MIL/uL — ABNORMAL HIGH (ref 3.87–5.11)
RDW: 15.6 % — AB (ref 11.5–15.5)
WBC: 10.6 10*3/uL — ABNORMAL HIGH (ref 4.0–10.5)

## 2017-03-07 LAB — COMPREHENSIVE METABOLIC PANEL
ALBUMIN: 3.3 g/dL — AB (ref 3.5–5.0)
ALT: 14 U/L (ref 14–54)
ANION GAP: 6 (ref 5–15)
AST: 14 U/L — ABNORMAL LOW (ref 15–41)
Alkaline Phosphatase: 71 U/L (ref 38–126)
BUN: 9 mg/dL (ref 6–20)
CO2: 25 mmol/L (ref 22–32)
Calcium: 8.4 mg/dL — ABNORMAL LOW (ref 8.9–10.3)
Chloride: 104 mmol/L (ref 101–111)
Creatinine, Ser: 0.8 mg/dL (ref 0.44–1.00)
GFR calc Af Amer: >60 mL/min (ref >60)
GFR calc non Af Amer: >60 mL/min (ref >60)
Glucose, Bld: 91 mg/dL (ref 65–99)
POTASSIUM: 3.7 mmol/L (ref 3.5–5.1)
SODIUM: 135 mmol/L (ref 135–145)
TOTAL PROTEIN: 6.2 g/dL — AB (ref 6.5–8.1)
Total Bilirubin: 0.7 mg/dL (ref 0.3–1.2)

## 2017-03-07 LAB — URINALYSIS, ROUTINE W REFLEX MICROSCOPIC
Bacteria, UA: NONE SEEN
Bilirubin Urine: NEGATIVE
Glucose, UA: NEGATIVE mg/dL
Ketones, ur: NEGATIVE mg/dL
Leukocytes, UA: NEGATIVE
Nitrite: NEGATIVE
Protein, ur: NEGATIVE mg/dL
SPECIFIC GRAVITY, URINE: 1.001 — AB (ref 1.005–1.030)
pH: 7 (ref 5.0–8.0)

## 2017-03-07 LAB — RAPID URINE DRUG SCREEN, HOSP PERFORMED
AMPHETAMINES: NOT DETECTED
BARBITURATES: NOT DETECTED
BENZODIAZEPINES: NOT DETECTED
Cocaine: NOT DETECTED
Opiates: NOT DETECTED
Tetrahydrocannabinol: NOT DETECTED

## 2017-03-07 LAB — BRAIN NATRIURETIC PEPTIDE: B Natriuretic Peptide: 148 pg/mL — ABNORMAL HIGH (ref 0.0–100.0)

## 2017-03-07 LAB — I-STAT TROPONIN, ED: TROPONIN I, POC: 0 ng/mL (ref 0.00–0.08)

## 2017-03-07 MED ORDER — PANTOPRAZOLE SODIUM 40 MG IV SOLR
40.0000 mg | Freq: Once | INTRAVENOUS | Status: AC
  Administered 2017-03-07: 40 mg via INTRAVENOUS
  Filled 2017-03-07: qty 40

## 2017-03-07 MED ORDER — PROMETHAZINE HCL 25 MG/ML IJ SOLN
12.5000 mg | Freq: Once | INTRAMUSCULAR | Status: AC
  Administered 2017-03-07: 12.5 mg via INTRAVENOUS
  Filled 2017-03-07: qty 1

## 2017-03-07 MED ORDER — SODIUM CHLORIDE 0.9 % IV SOLN: INTRAVENOUS | Status: DC

## 2017-03-07 MED ORDER — IOPAMIDOL (ISOVUE-300) INJECTION 61%
100.0000 mL | Freq: Once | INTRAVENOUS | Status: AC | PRN
  Administered 2017-03-07: 100 mL via INTRAVENOUS

## 2017-03-07 MED ORDER — SODIUM CHLORIDE 0.9 % IV BOLUS (SEPSIS)
500.0000 mL | Freq: Once | INTRAVENOUS | Status: AC
  Administered 2017-03-07: 500 mL via INTRAVENOUS

## 2017-03-07 MED ORDER — PROMETHAZINE HCL 25 MG PO TABS: 25.0000 mg | ORAL_TABLET | Freq: Four times a day (QID) | ORAL | 1 refills | Status: DC | PRN

## 2017-03-07 NOTE — ED Triage Notes (Signed)
Pt reports sob, blood in stools, and vomiting blood x 3 days.  Reports chest pain x 1 hour that radiates to back.

## 2017-03-07 NOTE — Progress Notes (Deleted)
Cardiology Office Note    Date:  03/07/2017   ID:  Lynn Logan, DOB [birth year], MRN [id number]  PCP:  Bridget Hartshorn, NP  Cardiologist:   Jenkins Rouge, MD   No chief complaint on file.   History of Present Illness:  Lynn Logan is a 46 y.o. female initially  referred for evaluation of chest pain 02/23/16  She is on disability for fibromyalgia She smokes 2-3 ppd.  She describes being admitted to [facility] last year for CHF. She is sedentary with poor Diet. Just started on oxygen for COPD Chest pain atypical not related to exertion has had for months [employer] Can wake her from sleep not related to food. Norco helps.   Echo reviewed 03/09/16 EF 44-01% grade 2 diastolic mild LAE no valve disease  Myovue 03/09/16 diaphragmatic attenuation no ischemia EF 65%   Seen in ER 02/28/17 for chest pain She states she always has some degree of shortness of breath from her COPD and smoking but it seems worse once his chest pain started. It started about one hour prior to arrival. She was digging and pulling up plans in her garden when this started. The pain is sharp and just right of midline and goes just left of midline. Worsens with inspiration as well as twisting certain ways. Chronic cough, seems a little worse over the last couple days. No leg swelling. She is on estrogen due to prior hysterectomy. She also has an appointment with [facility] next month for evaluation of newly diagnosed breast cancer. D dimer and troponins negative ECG non acute   Seen by [facility] this month with multiple somatic complaints. GERD Rx prilosec chronic back pan seen by [facility] Rx with lyrica NSAI' sand flexeril , Restless leg syndrome Rx Requip and panic /anxiety Rx klonipin  Past Medical History:  Diagnosis Date  . Anxiety   . Arthritis    Rheumatoid  . Asthma   . Breast cancer ([place])   . CHF (congestive heart failure) ([place])   . Chronic back pain   . Colon cancer ([place])   . COPD (chronic  obstructive pulmonary disease) ([place])   . Depression   . Fibromyalgia   . Gout   . Heart attack ([place]) 2017  . Right lumbar radiculopathy   . Vitiligo    face    Past Surgical History:  Procedure Laterality Date  . CESAREAN SECTION    . DILITATION & CURRETTAGE/HYSTROSCOPY WITH NOVASURE ABLATION N/A 07/07/2015   Procedure: HYSTEROSCOPY, UTERINE CURETTAGE, ENDOMETRIAL  ABLATION Uterine Cavity Length=6.5cm Uterine Cavity Width=4.5cm Power=161 Watts Time=1 minute 19 seconds;  Surgeon: Florian Buff, MD;  Location: [facility];  Service: Gynecology;  Laterality: N/A;  . POLYPECTOMY  07/07/2015   Procedure: POLYPECTOMY;  Surgeon: Florian Buff, MD;  Location: [facility];  Service: Gynecology;;  . SALPINGOOPHORECTOMY Bilateral 10/13/2015   Procedure: BILATERAL SALPINGO OOPHORECTOMY;  Surgeon: Florian Buff, MD;  Location: [facility];  Service: Gynecology;  Laterality: Bilateral;  . TUBAL LIGATION    . VAGINAL HYSTERECTOMY N/A 10/13/2015   Procedure: HYSTERECTOMY VAGINAL;  Surgeon: Florian Buff, MD;  Location: [facility];  Service: Gynecology;  Laterality: N/A;    Current Medications: Outpatient Medications Prior to Visit  Medication Sig Dispense Refill  . albuterol (PROVENTIL HFA;VENTOLIN HFA) 108 (90 Base) MCG/ACT inhaler Inhale 2 puffs into the lungs every 6 (six) hours as needed.    Marland Kitchen albuterol (PROVENTIL) (2.5 MG/3ML) 0.083% nebulizer solution Take 3 mLs (2.5 mg total)  by nebulization every 6 (six) hours as needed for wheezing or shortness of breath. 150 mL 1  . budesonide-formoterol (SYMBICORT) 160-4.5 MCG/ACT inhaler Inhale 2 puffs into the lungs 2 (two) times daily.    . citalopram (CELEXA) 20 MG tablet Take 1 tablet by mouth daily.    . clonazePAM (KLONOPIN) 0.5 MG tablet Take 1 tablet by mouth 3 (three) times daily.    . cyclobenzaprine (FLEXERIL) 10 MG tablet Take 1 tablet by mouth at bedtime.    . diclofenac sodium (VOLTAREN) 1 % GEL Apply 2 g topically 4 (four) times daily. (Patient not taking:  Reported on 02/28/2017) 100 g 2  . DULoxetine (CYMBALTA) 60 MG capsule TAKE ONE CAPSULE BY MOUTH ONCE DAILY 30 capsule 5  . ergocalciferol (VITAMIN D2) 50000 units capsule Take 1 capsule by mouth once a week.    . estradiol (ESTRACE) 2 MG tablet Take 1 tablet (2 mg total) by mouth daily. 30 tablet 11  . ferrous sulfate 324 (65 Fe) MG TBEC Take 1 tablet (325 mg total) by mouth daily. 30 tablet 3  . HYDROcodone-acetaminophen (NORCO/VICODIN) 5-325 MG tablet Take 1 tablet by mouth every 6 (six) hours as needed for moderate pain (Must last 30 days.Do not take and drive a car or use machinery.). 50 tablet 0  . hydrOXYzine (ATARAX/VISTARIL) 25 MG tablet Take 1 tablet by mouth at bedtime.    . megestrol (MEGACE) 40 MG tablet Take 1 tablet (40 mg total) by mouth daily. 90 tablet 3  . naproxen (NAPROSYN) 500 MG tablet Take 1 tablet (500 mg total) by mouth 2 (two) times daily with a meal. 60 tablet 5  . piroxicam (FELDENE) 20 MG capsule Take 1 capsule (20 mg total) by mouth daily. 30 capsule 2  . polyethylene glycol powder (GLYCOLAX/MIRALAX) powder Take 17 g by mouth 2 (two) times daily as needed. 3350 g 1  . potassium chloride SA (K-DUR,KLOR-CON) 20 MEQ tablet Take 2 tablets (40 mEq total) by mouth 2 (two) times daily. 28 tablet 0  . predniSONE (DELTASONE) 20 MG tablet Take 2 tablets by mouth daily.  0  . pregabalin (LYRICA) 300 MG capsule Take 1 capsule (300 mg total) by mouth 2 (two) times daily. 60 capsule 1  . RaNITidine HCl (ZANTAC PO) Take 75 mg by mouth 2 (two) times daily.      No facility-administered medications prior to visit.      Allergies:   Toradol [ketorolac tromethamine]; Buspirone; Lyrica [pregabalin]; Gabapentin; Tramadol; Zofran [ondansetron hcl]; and Penicillins   Social History   Social History  . Marital status: Divorced    Spouse name: N/A  . Number of children: N/A  . Years of education: N/A   Social History Main Topics  . Smoking status: Heavy Tobacco Smoker     Packs/day: 1.00    Years: 30.00    Types: Cigarettes  . Smokeless tobacco: Former Systems developer  . Alcohol use No  . Drug use: No     Comment: Hx of marijuana use - None now  . Sexual activity: Not Currently    Birth control/ protection: Surgical   Other Topics Concern  . Not on file   Social History Narrative  . No narrative on file     Family History:  The patient's family history with cancer in parents mom currently alive with intestinal  Cancer dad died of colon cancer. Has handicapped son 63 living with her Domingo Mend' is in jail a lot Home situation seems bleek Occasional ETOH no  drugs    ROS:   Please see the history of present illness.    ROS All other systems reviewed and are negative.   PHYSICAL EXAM:   VS:  LMP 09/12/2015 (Exact Date)    Affect appropriate Healthy:  appears stated age 67: normal Neck supple with no adenopathy JVP normal no bruits no thyromegaly Lungs clear with no wheezing and good diaphragmatic motion Heart:  S1/S2 no murmur, no rub, gallop or click PMI normal Abdomen: benighn, BS positve, no tenderness, no AAA no bruit.  No HSM or HJR Distal pulses intact with no bruits No edema Neuro non-focal Skin warm and dry No muscular weakness    Wt Readings from Last 3 Encounters:  02/28/17 81.6 kg (180 lb)  02/15/17 82.6 kg (182 lb)  01/30/17 82.1 kg (181 lb)      Studies/Labs Reviewed:   EKG:  02/15/16  SR rate 57 normal ECG  02/28/17 NSR rate 78 normal   Recent Labs: 03/24/2016: ALT 16 02/28/2017: BUN 16; Creatinine, Ser 0.98; Hemoglobin 13.7; Platelets 275; Potassium 3.7; Sodium 139   Lipid Panel    Component Value Date/Time   CHOL 197 12/31/2015 0949   TRIG 123 12/31/2015 0949   HDL 48 12/31/2015 0949   CHOLHDL 4.1 12/31/2015 0949   LDLCALC 124 (H) 12/31/2015 0949    Additional studies/ records that were reviewed today include:  Notes [facility] DR Dettinger ECG and CXR    ASSESSMENT:    No diagnosis found.   PLAN:  In order of  problems listed above:  1. Chest Pain atypical likely related to fibromyalgia Normal myovue June 2017 *** 2. Chol: continue diet Rx 3. HTN Well controlled.  Continue current medications and low sodium Dash type diet.   4. COPD continue nebulizers and oxygen.  Sleep study 07/18/16 ***  5.   Breast Cancer:     Medication Adjustments/Labs and Tests Ordered: Current medicines are reviewed at length with the patient today.  Concerns regarding medicines are outlined above.  Medication changes, Labs and Tests ordered today are listed in the Patient Instructions below. There are no Patient Instructions on file for this visit.   Signed, Jenkins Rouge, MD  03/07/2017 6:08 PM    [facility] [place], [place], [place]  [address] Phone: [phone]; Fax: [phone]

## 2017-03-07 NOTE — ED Provider Notes (Signed)
[facility] DEPT Provider Note   CSN: [id number] Arrival date & time: 03/07/17  1842     History   Chief Complaint Chief Complaint  Patient presents with  . GI Bleeding  . Chest Pain    HPI Lynn Logan is a 47 y.o. female.   Patient seen on June 20 for complaint of chest pain patient left AMA. Patient's d-dimer at that time was negative. Troponin was negative. Patient presents today with complaint of chest pain shortness of breath seems symptoms that she had then. As well as a generalized abdominal pain states she's been vomiting some blood and having blood in her bowel movements. Patient also complained of headache.      Past Medical History:  Diagnosis Date  . Anxiety   . Arthritis    Rheumatoid  . Asthma   . Breast cancer ([place])   . CHF (congestive heart failure) ([place])   . Chronic back pain   . Colon cancer ([place])   . COPD (chronic obstructive pulmonary disease) ([place])   . Depression   . Fibromyalgia   . Gout   . Heart attack ([place]) 2017  . Renal disorder   . Right lumbar radiculopathy   . Vitiligo    face    Patient Active Problem List   Diagnosis Date Noted  . Hypersomnia 05/11/2016  . COPD (chronic obstructive pulmonary disease) ([place]) 05/11/2016  . Tobacco user 05/11/2016  . Diastolic CHF ([place]) 76/54/6503  . History of orthopnea 04/06/2016  . Gall bladder polyp 03/09/2016  . Reactive airway disease 02/21/2016  . Healthcare maintenance 02/10/2016  . Arthritis of knee 01/24/2016  . Microcytic anemia 12/31/2015  . Obesity 11/26/2015  . Fibromyalgia 11/26/2015  . Stress incontinence 11/26/2015  . Right knee pain 11/16/2015  . Left knee pain 11/16/2015  . Knee pain 11/09/2015  . S/P vaginal hysterectomy 10/13/2015  . Right arm pain 06/24/2015  . Right leg pain 06/24/2015  . Back pain 06/11/2015  . Menorrhagia 05/21/2015  . Mallory-Weiss tear 05/21/2015  . Peripheral neuropathy 05/21/2015  . Depression 05/21/2015    Past Surgical History:    Procedure Laterality Date  . CESAREAN SECTION    . DILITATION & CURRETTAGE/HYSTROSCOPY WITH NOVASURE ABLATION N/A 07/07/2015   Procedure: HYSTEROSCOPY, UTERINE CURETTAGE, ENDOMETRIAL  ABLATION Uterine Cavity Length=6.5cm Uterine Cavity Width=4.5cm Power=161 Watts Time=1 minute 19 seconds;  Surgeon: Florian Buff, MD;  Location: [facility];  Service: Gynecology;  Laterality: N/A;  . POLYPECTOMY  07/07/2015   Procedure: POLYPECTOMY;  Surgeon: Florian Buff, MD;  Location: [facility];  Service: Gynecology;;  . SALPINGOOPHORECTOMY Bilateral 10/13/2015   Procedure: BILATERAL SALPINGO OOPHORECTOMY;  Surgeon: Florian Buff, MD;  Location: [facility];  Service: Gynecology;  Laterality: Bilateral;  . TUBAL LIGATION    . VAGINAL HYSTERECTOMY N/A 10/13/2015   Procedure: HYSTERECTOMY VAGINAL;  Surgeon: Florian Buff, MD;  Location: [facility];  Service: Gynecology;  Laterality: N/A;    OB History    Gravida Para Term Preterm AB Living   3 3           SAB TAB Ectopic Multiple Live Births                   Home Medications    Prior to Admission medications   Medication Sig Start Date End Date Taking? Authorizing Provider  albuterol (PROVENTIL HFA;VENTOLIN HFA) 108 (90 Base) MCG/ACT inhaler Inhale 2 puffs into the lungs every 6 (six) hours as needed. 05/08/16 05/08/17 Yes [provider]  albuterol (PROVENTIL) (2.5 MG/3ML) 0.083% nebulizer solution Take 3 mLs (2.5 mg total) by nebulization every 6 (six) hours as needed for wheezing or shortness of breath. 03/29/16  Yes Eustaquio Maize, MD  budesonide-formoterol [facility]) 160-4.5 MCG/ACT inhaler Inhale 2 puffs into the lungs 2 (two) times daily. 05/08/16 05/08/17 Yes [provider]  citalopram (CELEXA) 20 MG tablet Take 1 tablet by mouth daily. 05/08/16 05/08/17 Yes [provider]  clonazePAM (KLONOPIN) 0.5 MG tablet Take 1 tablet by mouth 3 (three) times daily. 01/26/17  Yes [provider]  cyclobenzaprine (FLEXERIL) 10 MG tablet Take  1 tablet by mouth at bedtime. 02/12/17  Yes [provider]  DULoxetine (CYMBALTA) 60 MG capsule TAKE ONE CAPSULE BY MOUTH ONCE DAILY 12/21/15  Yes Timmothy Euler, MD  ergocalciferol (VITAMIN D2) 50000 units capsule Take 1 capsule by mouth once a week. 05/09/16 05/09/17 Yes [provider]  estradiol (ESTRACE) 2 MG tablet Take 1 tablet (2 mg total) by mouth daily. 01/30/17  Yes Florian Buff, MD  ferrous sulfate 324 (65 Fe) MG TBEC Take 1 tablet (325 mg total) by mouth daily. 01/06/16  Yes Timmothy Euler, MD  HYDROcodone-acetaminophen (NORCO/VICODIN) 5-325 MG tablet Take 1 tablet by mouth every 6 (six) hours as needed for moderate pain (Must last 30 days.Do not take and drive a car or use machinery.). 02/15/17  Yes Sanjuana Kava, MD  hydrOXYzine (ATARAX/VISTARIL) 25 MG tablet Take 1 tablet by mouth at bedtime.   Yes [provider]  megestrol (MEGACE) 40 MG tablet Take 1 tablet (40 mg total) by mouth daily. 03/08/16  Yes Dettinger, Fransisca Kaufmann, MD  naproxen (NAPROSYN) 500 MG tablet Take 1 tablet (500 mg total) by mouth 2 (two) times daily with a meal. 12/21/16  Yes Sanjuana Kava, MD  piroxicam (FELDENE) 20 MG capsule Take 1 capsule (20 mg total) by mouth daily. 05/23/16  Yes Florian Buff, MD  polyethylene glycol powder (GLYCOLAX/MIRALAX) powder Take 17 g by mouth 2 (two) times daily as needed. 03/29/16  Yes Eustaquio Maize, MD  potassium chloride SA (K-DUR,KLOR-CON) 20 MEQ tablet Take 2 tablets (40 mEq total) by mouth 2 (two) times daily. 03/25/16  Yes Mesner, Corene Cornea, MD  pregabalin (LYRICA) 300 MG capsule Take 1 capsule (300 mg total) by mouth 2 (two) times daily. 01/30/17  Yes Florian Buff, MD  RaNITidine HCl (ZANTAC PO) Take 75 mg by mouth daily as needed (acid reflux).    Yes [provider]  rOPINIRole (REQUIP) 1 MG tablet Take 1 tablet by mouth at bedtime. 01/31/17  Yes [provider]  traZODone (DESYREL) 150 MG tablet Take 1 tablet by mouth at  bedtime. 03/01/17  Yes [provider]  diclofenac sodium (VOLTAREN) 1 % GEL Apply 2 g topically 4 (four) times daily. Patient not taking: Reported on 02/28/2017 06/24/15   Timmothy Euler, MD  promethazine (PHENERGAN) 25 MG tablet Take 1 tablet (25 mg total) by mouth every 6 (six) hours as needed. 03/07/17   Fredia Sorrow, MD    Family History Family History  Problem Relation Age of Onset  . Diabetes Mother   . Congestive Heart Failure Mother   . Depression Father   . Hypertension Father   . Cancer Father   . Heart disease Maternal Uncle     Social History Social History  Substance Use Topics  . Smoking status: Heavy Tobacco Smoker    Packs/day: 1.00    Years: 30.00  Types: Cigarettes  . Smokeless tobacco: Former Systems developer  . Alcohol use No     Allergies   Toradol [ketorolac tromethamine]; Buspirone; Lyrica [pregabalin]; Gabapentin; Tramadol; Zofran [ondansetron hcl]; and Penicillins   Review of Systems Review of Systems  Constitutional: Negative for appetite change and fever.  HENT: Positive for nosebleeds. Negative for congestion.   Eyes: Negative for visual disturbance.  Respiratory: Positive for shortness of breath.   Cardiovascular: Positive for chest pain.  Gastrointestinal: Positive for abdominal pain, blood in stool, nausea and vomiting. Negative for diarrhea.  Genitourinary: Negative for dysuria.  Musculoskeletal: Negative for back pain.  Neurological: Negative for headaches.  Hematological: Bruises/bleeds easily.  Psychiatric/Behavioral: Negative for confusion.     Physical Exam Updated Vital Signs BP (!) 127/110   Pulse 87   Temp 97.7 F (36.5 C) (Oral)   Resp (!) 32   Ht 1.626 m (5\' 4" )   Wt 81.6 kg (180 lb)   LMP 09/12/2015 (Exact Date)   SpO2 98%   BMI 30.90 kg/m   Physical Exam  Constitutional: She is oriented to person, place, and time. She appears well-developed and well-nourished. No distress.  HENT:  Head: Normocephalic  and atraumatic.  Mouth/Throat: Oropharynx is clear and moist.  Eyes: EOM are normal. Pupils are equal, round, and reactive to light.  Neck: Normal range of motion. Neck supple.  Cardiovascular: Normal rate and regular rhythm.   Pulmonary/Chest: Effort normal.  Abdominal: Soft. Bowel sounds are normal. There is no tenderness.  Genitourinary: Rectal exam shows guaiac negative stool.  Neurological: She is alert and oriented to person, place, and time. No cranial nerve deficit or sensory deficit. She exhibits normal muscle tone. Coordination normal.  Skin: Skin is warm.  Nursing note and vitals reviewed.    ED Treatments / Results  Labs (all labs ordered are listed, but only abnormal results are displayed) Labs Reviewed  CBC WITH DIFFERENTIAL/PLATELET - Abnormal; Notable for the following:       Result Value   WBC 10.6 (*)    RBC 5.26 (*)    MCV 76.2 (*)    MCH 24.7 (*)    RDW 15.6 (*)    All other components within normal limits  COMPREHENSIVE METABOLIC PANEL - Abnormal; Notable for the following:    Calcium 8.4 (*)    Total Protein 6.2 (*)    Albumin 3.3 (*)    AST 14 (*)    All other components within normal limits  URINALYSIS, ROUTINE W REFLEX MICROSCOPIC - Abnormal; Notable for the following:    Color, Urine COLORLESS (*)    Specific Gravity, Urine 1.001 (*)    Hgb urine dipstick SMALL (*)    Squamous Epithelial / LPF 0-5 (*)    All other components within normal limits  RAPID URINE DRUG SCREEN, HOSP PERFORMED  BRAIN NATRIURETIC PEPTIDE  I-STAT TROPOININ, ED    EKG  EKG Interpretation  Date/Time:  Wednesday March 07 2017 18:46:56 EDT Ventricular Rate:  75 PR Interval:    QRS Duration: 105 QT Interval:  407 QTC Calculation: 455 R Axis:   24 Text Interpretation:  Sinus rhythm Abnormal R-wave progression, early transition Baseline wander in lead(s) III aVL aVF No significant change since last tracing Confirmed by Fredia Sorrow [phone]) on 03/07/2017 6:54:32 PM         Radiology Dg Chest 2 View  Result Date: 03/07/2017 CLINICAL DATA:  46 year old female with chest pain and shortness of breath for 3 days. EXAM: CHEST  2 VIEW  COMPARISON:  02/28/2017 and prior exams FINDINGS: The cardiomediastinal silhouette is unremarkable. There is no evidence of focal airspace disease, pulmonary edema, suspicious pulmonary nodule/mass, pleural effusion, or pneumothorax. No acute bony abnormalities are identified. IMPRESSION: No active cardiopulmonary disease. Electronically Signed   By: Margarette Canada M.D.   On: 03/07/2017 20:01   Ct Head Wo Contrast  Result Date: 03/07/2017 CLINICAL DATA:  Headache EXAM: CT HEAD WITHOUT CONTRAST TECHNIQUE: Contiguous axial images were obtained from the base of the skull through the vertex without intravenous contrast. COMPARISON:  None. FINDINGS: Examination is degraded secondary to patient motion artifact necessitating the acquisition of additional images. Brain: Gray-white differentiation is maintained. No CT evidence of acute large territory infarct. No intraparenchymal or extra-axial mass or hemorrhage. Normal size and configuration of the ventricles and basilar cisterns. No midline shift. Vascular: No hyperdense vessel or unexpected calcification. Skull: No displaced calvarial fracture. Sinuses/Orbits: Limited visualization the paranasal sinuses and mastoid air cells is normal. No air-fluid levels. Other: Regional soft tissues appear normal. IMPRESSION: Degraded examination without acute intracranial process. Electronically Signed   By: Sandi Mariscal M.D.   On: 03/07/2017 20:37   Ct Abdomen Pelvis W Contrast  Result Date: 03/07/2017 CLINICAL DATA:  46 year old female with abdominal pain and GI bleed. EXAM: CT ABDOMEN AND PELVIS WITH CONTRAST TECHNIQUE: Multidetector CT imaging of the abdomen and pelvis was performed using the standard protocol following bolus administration of intravenous contrast. CONTRAST:  119mL ISOVUE-300 IOPAMIDOL  (ISOVUE-300) INJECTION 61% COMPARISON:  03/25/2016 CT a FINDINGS: Lower chest: No acute abnormality Hepatobiliary: The liver and gallbladder are unremarkable. There is no evidence of biliary dilatation. Pancreas: Unremarkable Spleen: Unremarkable Adrenals/Urinary Tract: The kidneys, adrenal glands and bladder are unremarkable. Stomach/Bowel: Colonic diverticulosis noted without evidence of diverticulitis. There is no evidence of bowel obstruction, bowel wall thickening or inflammatory changes. The appendix is normal. Vascular/Lymphatic: No significant vascular findings are present. No enlarged abdominal or pelvic lymph nodes. Reproductive: Status post hysterectomy. No adnexal masses. A 3 x 3.5 cm cyst along the left vaginal wall previously measured 1.2 x 2.6 cm. Other: No ascites, abscess or pneumoperitoneum. Musculoskeletal: No acute or suspicious abnormality. IMPRESSION: No acute abnormality. Colonic diverticulosis without evidence of diverticulitis. No other bowel abnormalities identified. Enlarging 3 x 3.5 cm cyst along the left vaginal wall. Electronically Signed   By: Margarette Canada M.D.   On: 03/07/2017 20:43    Procedures Procedures (including critical care time)  Medications Ordered in ED Medications  0.9 %  sodium chloride infusion (not administered)  sodium chloride 0.9 % bolus 500 mL (0 mLs Intravenous Stopped 03/07/17 2036)  promethazine (PHENERGAN) injection 12.5 mg (12.5 mg Intravenous Given 03/07/17 1941)  pantoprazole (PROTONIX) injection 40 mg (40 mg Intravenous Given 03/07/17 1941)  iopamidol (ISOVUE-300) 61 % injection 100 mL (100 mLs Intravenous Contrast Given 03/07/17 2004)     Initial Impression / Assessment and Plan / ED Course  I have reviewed the triage vital signs and the nursing notes.  Pertinent labs & imaging results that were available during my care of the patient were reviewed by me and considered in my medical decision making (see chart for details).      Patient  with multiple complaints to include headache chest pain shortness of breath over those were evaluated initially on June 20. When patient left AMA. Also now with complaint of abdominal pain blood with vomiting states vomited 3 times and blood in her bowel movements states she had 3 episodes of blood in her  bowel movements. States she was at the dentist today and her gums were bleeding she's had nosebleeds. States that her skin bruises easily. Patient is not on any blood thinners. Patient has a history of bipolar disease.    Patient was requesting food immediately upon arrival to the emergency department by [facility]. Patient's workup for the abdominal pain negative CT guaiac negative. No vomiting here. There are function tests without significant abnormalities.  For other sources of bleeding platelets are normal. Patient's troponin was normal. No evidence of acute infarct despite the fact that she states she's had this substernal chest pain since before June 20. And that occurs most days. Does not appear to be in acute cardiac process.Patient states she has a history of CHF. Chest x-ray negative for pulmonary edema and pneumonia.   Essentially workup without any acute findings. Patient has primary care doctor to follow-up with. Patient stable for discharge home.   D-dimer was not repeated that was done on June 20 and was negative. Patient had same complaint shortness of breath then.  Final Clinical Impressions(s) / ED Diagnoses   Final diagnoses:  Precordial pain  SOB (shortness of breath)  Generalized abdominal pain  Nausea and vomiting, intractability of vomiting not specified, unspecified vomiting type    New Prescriptions New Prescriptions   PROMETHAZINE (PHENERGAN) 25 MG TABLET    Take 1 tablet (25 mg total) by mouth every 6 (six) hours as needed.     Fredia Sorrow, MD 03/07/17 2134

## 2017-03-07 NOTE — Discharge Instructions (Signed)
Take the Phenergan as directed for the vomiting. Workup here today without any acute findings. Make up limits follow-up with your doctor.

## 2017-03-08 ENCOUNTER — Ambulatory Visit: Payer: Medicare Other | Admitting: Cardiovascular Disease

## 2017-03-08 ENCOUNTER — Encounter: Payer: Self-pay | Admitting: Orthopaedic Surgery

## 2017-03-08 ENCOUNTER — Ambulatory Visit (INDEPENDENT_AMBULATORY_CARE_PROVIDER_SITE_OTHER): Payer: Medicare Other | Admitting: Orthopaedic Surgery

## 2017-03-08 VITALS — BP 149/85 | HR 86 | Temp 97.7°F | Ht 64.0 in | Wt 180.0 lb

## 2017-03-08 DIAGNOSIS — M25531 Pain in right wrist: Secondary | ICD-10-CM | POA: Diagnosis not present

## 2017-03-08 DIAGNOSIS — G8929 Other chronic pain: Secondary | ICD-10-CM

## 2017-03-08 DIAGNOSIS — F17210 Nicotine dependence, cigarettes, uncomplicated: Secondary | ICD-10-CM | POA: Diagnosis not present

## 2017-03-08 DIAGNOSIS — M25532 Pain in left wrist: Secondary | ICD-10-CM

## 2017-03-08 DIAGNOSIS — M25561 Pain in right knee: Secondary | ICD-10-CM | POA: Diagnosis not present

## 2017-03-08 DIAGNOSIS — M25562 Pain in left knee: Secondary | ICD-10-CM | POA: Diagnosis not present

## 2017-03-08 NOTE — Progress Notes (Signed)
Patient [name], female DOB:06-04-1971, 46 y.o. HYQ:[id number]  Chief Complaint  Patient presents with  . Follow-up    bilateral knee pain, bilateral wrist pain    HPI  Lynn Logan is a 46 y.o. female who has bilateral knee pain and bilateral wrist pain.  She was seen in the ER yesterday with chest pain and shortness of breath and not feeling well.  I have reviewed the ER records.  Her knees are tender but less than before.  Her wrists are tender but less than before.  She has no new trauma.  She was scheduled for PT at [facility] today but they did not do it.  She wants to reschedule at [facility]. HPI  Body mass index is 30.9 kg/m.  ROS  Review of Systems  Constitutional: Positive for fatigue.       She has no diabetes She has no hypertension She has Asthma She smokes   HENT: Negative for congestion.   Respiratory: Positive for shortness of breath and wheezing.   Cardiovascular: Negative for chest pain.  Endocrine: Positive for cold intolerance.  Musculoskeletal: Positive for arthralgias, back pain, gait problem, joint swelling and myalgias.  Allergic/Immunologic: Positive for environmental allergies.  Neurological: Negative for numbness.  Psychiatric/Behavioral: The patient is nervous/anxious.     Past Medical History:  Diagnosis Date  . Anxiety   . Arthritis    Rheumatoid  . Asthma   . Breast cancer ([place])   . CHF (congestive heart failure) ([place])   . Chronic back pain   . Colon cancer ([place])   . COPD (chronic obstructive pulmonary disease) ([place])   . Depression   . Fibromyalgia   . Gout   . Heart attack ([place]) 2017  . Renal disorder   . Right lumbar radiculopathy   . Vitiligo    face    Past Surgical History:  Procedure Laterality Date  . CESAREAN SECTION    . DILITATION & CURRETTAGE/HYSTROSCOPY WITH NOVASURE ABLATION N/A 07/07/2015   Procedure: HYSTEROSCOPY, UTERINE CURETTAGE, ENDOMETRIAL  ABLATION Uterine Cavity Length=6.5cm Uterine Cavity  Width=4.5cm Power=161 Watts Time=1 minute 19 seconds;  Surgeon: Florian Buff, MD;  Location: [facility];  Service: Gynecology;  Laterality: N/A;  . POLYPECTOMY  07/07/2015   Procedure: POLYPECTOMY;  Surgeon: Florian Buff, MD;  Location: [facility];  Service: Gynecology;;  . SALPINGOOPHORECTOMY Bilateral 10/13/2015   Procedure: BILATERAL SALPINGO OOPHORECTOMY;  Surgeon: Florian Buff, MD;  Location: [facility];  Service: Gynecology;  Laterality: Bilateral;  . TUBAL LIGATION    . VAGINAL HYSTERECTOMY N/A 10/13/2015   Procedure: HYSTERECTOMY VAGINAL;  Surgeon: Florian Buff, MD;  Location: [facility];  Service: Gynecology;  Laterality: N/A;    Family History  Problem Relation Age of Onset  . Diabetes Mother   . Congestive Heart Failure Mother   . Depression Father   . Hypertension Father   . Cancer Father   . Heart disease Maternal Uncle     Social History Social History  Substance Use Topics  . Smoking status: Heavy Tobacco Smoker    Packs/day: 1.00    Years: 30.00    Types: Cigarettes  . Smokeless tobacco: Former Systems developer  . Alcohol use No    Allergies  Allergen Reactions  . Toradol [Ketorolac Tromethamine] Itching  . Buspirone Nausea And Vomiting  . Lyrica [Pregabalin] Nausea And Vomiting  . Gabapentin Nausea And Vomiting  . Tramadol Nausea And Vomiting  . Zofran [Ondansetron Hcl] Nausea And Vomiting  . Penicillins  Rash    Has patient had a PCN reaction causing immediate rash, facial/tongue/throat swelling, SOB or lightheadedness with hypotension: No Has patient had a PCN reaction causing severe rash involving mucus membranes or skin necrosis: No Has patient had a PCN reaction that required hospitalization No Has patient had a PCN reaction occurring within the last 10 years: yes If all of the above answers are "NO", then may proceed with Cephalosporin use.     Current Outpatient Prescriptions  Medication Sig Dispense Refill  . albuterol (PROVENTIL HFA;VENTOLIN HFA) 108 (90 Base) MCG/ACT  inhaler Inhale 2 puffs into the lungs every 6 (six) hours as needed.    Marland Kitchen albuterol (PROVENTIL) (2.5 MG/3ML) 0.083% nebulizer solution Take 3 mLs (2.5 mg total) by nebulization every 6 (six) hours as needed for wheezing or shortness of breath. 150 mL 1  . budesonide-formoterol (SYMBICORT) 160-4.5 MCG/ACT inhaler Inhale 2 puffs into the lungs 2 (two) times daily.    . citalopram (CELEXA) 20 MG tablet Take 1 tablet by mouth daily.    . clonazePAM (KLONOPIN) 0.5 MG tablet Take 1 tablet by mouth 3 (three) times daily.    . cyclobenzaprine (FLEXERIL) 10 MG tablet Take 1 tablet by mouth at bedtime.    . diclofenac sodium (VOLTAREN) 1 % GEL Apply 2 g topically 4 (four) times daily. (Patient not taking: Reported on 02/28/2017) 100 g 2  . DULoxetine (CYMBALTA) 60 MG capsule TAKE ONE CAPSULE BY MOUTH ONCE DAILY 30 capsule 5  . ergocalciferol (VITAMIN D2) 50000 units capsule Take 1 capsule by mouth once a week.    . estradiol (ESTRACE) 2 MG tablet Take 1 tablet (2 mg total) by mouth daily. 30 tablet 11  . ferrous sulfate 324 (65 Fe) MG TBEC Take 1 tablet (325 mg total) by mouth daily. 30 tablet 3  . HYDROcodone-acetaminophen (NORCO/VICODIN) 5-325 MG tablet Take 1 tablet by mouth every 6 (six) hours as needed for moderate pain (Must last 30 days.Do not take and drive a car or use machinery.). 50 tablet 0  . hydrOXYzine (ATARAX/VISTARIL) 25 MG tablet Take 1 tablet by mouth at bedtime.    . megestrol (MEGACE) 40 MG tablet Take 1 tablet (40 mg total) by mouth daily. 90 tablet 3  . naproxen (NAPROSYN) 500 MG tablet Take 1 tablet (500 mg total) by mouth 2 (two) times daily with a meal. 60 tablet 5  . piroxicam (FELDENE) 20 MG capsule Take 1 capsule (20 mg total) by mouth daily. 30 capsule 2  . polyethylene glycol powder (GLYCOLAX/MIRALAX) powder Take 17 g by mouth 2 (two) times daily as needed. 3350 g 1  . potassium chloride SA (K-DUR,KLOR-CON) 20 MEQ tablet Take 2 tablets (40 mEq total) by mouth 2 (two) times  daily. 28 tablet 0  . pregabalin (LYRICA) 300 MG capsule Take 1 capsule (300 mg total) by mouth 2 (two) times daily. 60 capsule 1  . promethazine (PHENERGAN) 25 MG tablet Take 1 tablet (25 mg total) by mouth every 6 (six) hours as needed. 12 tablet 1  . RaNITidine HCl (ZANTAC PO) Take 75 mg by mouth daily as needed (acid reflux).     Marland Kitchen rOPINIRole (REQUIP) 1 MG tablet Take 1 tablet by mouth at bedtime.    . traZODone (DESYREL) 150 MG tablet Take 1 tablet by mouth at bedtime.  2   No current facility-administered medications for this visit.      Physical Exam  Blood pressure (!) 149/85, pulse 86, temperature 97.7 F (36.5 C), height  5\' 4"  (1.626 m), weight 180 lb (81.6 kg), last menstrual period 09/12/2015.  Constitutional: overall normal hygiene, normal nutrition, well developed, normal grooming, normal body habitus. Assistive device:none  Musculoskeletal: gait and station Limp none, muscle tone and strength are normal, no tremors or atrophy is present.  .  Neurological: coordination overall normal.  Deep tendon reflex/nerve stretch intact.  Sensation normal.  Cranial nerves II-XII intact.   Skin:   Normal overall no scars, lesions, ulcers or rashes. No psoriasis.  Psychiatric: Alert and oriented x 3.  Recent memory intact, remote memory unclear.  Normal mood and affect. Well groomed.  Good eye contact.  Cardiovascular: overall no swelling, no varicosities, no edema bilaterally, normal temperatures of the legs and arms, no clubbing, cyanosis and good capillary refill.  Lymphatic: palpation is normal.  Both knees are tender but have no effusion.  She has some bilateral crepitus.  ROM is 0 to 110 right and 0-105 left.  Gait is good.  NV intact.  Both wrists are tender but have no swelling, no redness and full ROM.  Grips normal.  The patient has been educated about the nature of the problem(s) and counseled on treatment options.  The patient appeared to understand what I have  discussed and is in agreement with it.  Encounter Diagnosis  Name Primary?  . Chronic pain of right knee Yes    PLAN Call if any problems.  Precautions discussed.  Continue current medications.   Return to clinic 1 month   Electronically Signed Sanjuana Kava, MD 6/28/20182:28 PM

## 2017-03-08 NOTE — Patient Instructions (Signed)
Steps to Quit Smoking Smoking tobacco can be bad for your health. It can also affect almost every organ in your body. Smoking puts you and people around you at risk for many serious long-lasting (chronic) diseases. Quitting smoking is hard, but it is one of the best things that you can do for your health. It is never too late to quit. What are the benefits of quitting smoking? When you quit smoking, you lower your risk for getting serious diseases and conditions. They can include:  Lung cancer or lung disease.  Heart disease.  Stroke.  Heart attack.  Not being able to have children (infertility).  Weak bones (osteoporosis) and broken bones (fractures).  If you have coughing, wheezing, and shortness of breath, those symptoms may get better when you quit. You may also get sick less often. If you are pregnant, quitting smoking can help to lower your chances of having a baby of low birth weight. What can I do to help me quit smoking? Talk with your doctor about what can help you quit smoking. Some things you can do (strategies) include:  Quitting smoking totally, instead of slowly cutting back how much you smoke over a period of time.  Going to in-person counseling. You are more likely to quit if you go to many counseling sessions.  Using resources and support systems, such as: ? Online chats with a counselor. ? Phone quitlines. ? Printed self-help materials. ? Support groups or group counseling. ? Text messaging programs. ? Mobile phone apps or applications.  Taking medicines. Some of these medicines may have nicotine in them. If you are pregnant or breastfeeding, do not take any medicines to quit smoking unless your doctor says it is okay. Talk with your doctor about counseling or other things that can help you.  Talk with your doctor about using more than one strategy at the same time, such as taking medicines while you are also going to in-person counseling. This can help make  quitting easier. What things can I do to make it easier to quit? Quitting smoking might feel very hard at first, but there is a lot that you can do to make it easier. Take these steps:  Talk to your family and friends. Ask them to support and encourage you.  Call phone quitlines, reach out to support groups, or work with a counselor.  Ask people who smoke to not smoke around you.  Avoid places that make you want (trigger) to smoke, such as: ? Bars. ? Parties. ? Smoke-break areas at work.  Spend time with people who do not smoke.  Lower the stress in your life. Stress can make you want to smoke. Try these things to help your stress: ? Getting regular exercise. ? Deep-breathing exercises. ? Yoga. ? Meditating. ? Doing a body scan. To do this, close your eyes, focus on one area of your body at a time from head to toe, and notice which parts of your body are tense. Try to relax the muscles in those areas.  Download or buy apps on your mobile phone or tablet that can help you stick to your quit plan. There are many free apps, such as QuitGuide from the CDC (Centers for Disease Control and Prevention). You can find more support from smokefree.gov and other websites.  This information is not intended to replace advice given to you by your health care provider. Make sure you discuss any questions you have with your health care provider. Document Released: 06/24/2009 Document   Revised: 04/25/2016 Document Reviewed: 01/12/2015 Elsevier Interactive Patient Education  2018 Elsevier Inc.  

## 2017-03-14 ENCOUNTER — Encounter (HOSPITAL_COMMUNITY): Payer: Self-pay | Admitting: *Deleted

## 2017-03-14 ENCOUNTER — Emergency Department (HOSPITAL_COMMUNITY)
Admission: EM | Admit: 2017-03-14 | Discharge: 2017-03-14 | Disposition: A | Discharge status: Home / Self Care | Payer: Medicare Other | Attending: Emergency Medicine | Admitting: Emergency Medicine

## 2017-03-14 DIAGNOSIS — G479 Sleep disorder, unspecified: Secondary | ICD-10-CM | POA: Diagnosis not present

## 2017-03-14 DIAGNOSIS — Z5321 Procedure and treatment not carried out due to patient leaving prior to being seen by health care provider: Secondary | ICD-10-CM | POA: Insufficient documentation

## 2017-03-14 DIAGNOSIS — R0602 Shortness of breath: Secondary | ICD-10-CM | POA: Insufficient documentation

## 2017-03-14 NOTE — ED Triage Notes (Signed)
Pt states she does not have a ride and her ride today is ready to go and not willing to wait.  Pt states that she will come back tomorrow.

## 2017-03-14 NOTE — ED Triage Notes (Signed)
C/o inability to sleep for a couple of weeks, also c/o shortness of breath

## 2017-03-15 ENCOUNTER — Ambulatory Visit: Payer: Medicare Other | Admitting: Orthopaedic Surgery

## 2017-03-16 ENCOUNTER — Other Ambulatory Visit: Payer: Self-pay | Admitting: *Deleted

## 2017-03-16 ENCOUNTER — Other Ambulatory Visit: Payer: Self-pay | Admitting: Orthopaedic Surgery

## 2017-03-16 MED ORDER — HYDROCODONE-ACETAMINOPHEN 5-325 MG PO TABS: 1.0000 | ORAL_TABLET | Freq: Four times a day (QID) | ORAL | 0 refills | Status: DC | PRN

## 2017-03-19 ENCOUNTER — Ambulatory Visit (HOSPITAL_COMMUNITY): Payer: Medicare Other | Attending: Orthopaedic Surgery | Admitting: Physical Therapy

## 2017-03-19 DIAGNOSIS — R2681 Unsteadiness on feet: Secondary | ICD-10-CM | POA: Insufficient documentation

## 2017-03-19 DIAGNOSIS — M25561 Pain in right knee: Secondary | ICD-10-CM | POA: Insufficient documentation

## 2017-03-19 DIAGNOSIS — M6281 Muscle weakness (generalized): Secondary | ICD-10-CM | POA: Insufficient documentation

## 2017-03-19 DIAGNOSIS — G8929 Other chronic pain: Secondary | ICD-10-CM | POA: Insufficient documentation

## 2017-03-20 ENCOUNTER — Ambulatory Visit: Payer: Medicare Other | Admitting: Orthopaedic Surgery

## 2017-03-29 ENCOUNTER — Ambulatory Visit (HOSPITAL_COMMUNITY): Payer: Medicare Other | Admitting: Physical Therapy

## 2017-04-05 ENCOUNTER — Encounter: Payer: Self-pay | Admitting: Orthopaedic Surgery

## 2017-04-05 ENCOUNTER — Ambulatory Visit (INDEPENDENT_AMBULATORY_CARE_PROVIDER_SITE_OTHER): Payer: Medicare Other | Admitting: Orthopaedic Surgery

## 2017-04-05 VITALS — BP 122/81 | HR 64 | Temp 97.4°F | Ht 64.0 in | Wt 182.0 lb

## 2017-04-05 DIAGNOSIS — G8929 Other chronic pain: Secondary | ICD-10-CM

## 2017-04-05 DIAGNOSIS — M25562 Pain in left knee: Secondary | ICD-10-CM

## 2017-04-05 DIAGNOSIS — F17210 Nicotine dependence, cigarettes, uncomplicated: Secondary | ICD-10-CM

## 2017-04-05 DIAGNOSIS — M25561 Pain in right knee: Secondary | ICD-10-CM | POA: Diagnosis not present

## 2017-04-05 NOTE — Patient Instructions (Signed)
Steps to Quit Smoking Smoking tobacco can be bad for your health. It can also affect almost every organ in your body. Smoking puts you and people around you at risk for many serious long-lasting (chronic) diseases. Quitting smoking is hard, but it is one of the best things that you can do for your health. It is never too late to quit. What are the benefits of quitting smoking? When you quit smoking, you lower your risk for getting serious diseases and conditions. They can include:  Lung cancer or lung disease.  Heart disease.  Stroke.  Heart attack.  Not being able to have children (infertility).  Weak bones (osteoporosis) and broken bones (fractures).  If you have coughing, wheezing, and shortness of breath, those symptoms may get better when you quit. You may also get sick less often. If you are pregnant, quitting smoking can help to lower your chances of having a baby of low birth weight. What can I do to help me quit smoking? Talk with your doctor about what can help you quit smoking. Some things you can do (strategies) include:  Quitting smoking totally, instead of slowly cutting back how much you smoke over a period of time.  Going to in-person counseling. You are more likely to quit if you go to many counseling sessions.  Using resources and support systems, such as: ? Online chats with a counselor. ? Phone quitlines. ? Printed self-help materials. ? Support groups or group counseling. ? Text messaging programs. ? Mobile phone apps or applications.  Taking medicines. Some of these medicines may have nicotine in them. If you are pregnant or breastfeeding, do not take any medicines to quit smoking unless your doctor says it is okay. Talk with your doctor about counseling or other things that can help you.  Talk with your doctor about using more than one strategy at the same time, such as taking medicines while you are also going to in-person counseling. This can help make  quitting easier. What things can I do to make it easier to quit? Quitting smoking might feel very hard at first, but there is a lot that you can do to make it easier. Take these steps:  Talk to your family and friends. Ask them to support and encourage you.  Call phone quitlines, reach out to support groups, or work with a counselor.  Ask people who smoke to not smoke around you.  Avoid places that make you want (trigger) to smoke, such as: ? Bars. ? Parties. ? Smoke-break areas at work.  Spend time with people who do not smoke.  Lower the stress in your life. Stress can make you want to smoke. Try these things to help your stress: ? Getting regular exercise. ? Deep-breathing exercises. ? Yoga. ? Meditating. ? Doing a body scan. To do this, close your eyes, focus on one area of your body at a time from head to toe, and notice which parts of your body are tense. Try to relax the muscles in those areas.  Download or buy apps on your mobile phone or tablet that can help you stick to your quit plan. There are many free apps, such as QuitGuide from the CDC (Centers for Disease Control and Prevention). You can find more support from smokefree.gov and other websites.  This information is not intended to replace advice given to you by your health care provider. Make sure you discuss any questions you have with your health care provider. Document Released: 06/24/2009 Document   Revised: 04/25/2016 Document Reviewed: 01/12/2015 Elsevier Interactive Patient Education  2018 Elsevier Inc.  

## 2017-04-05 NOTE — Progress Notes (Signed)
Patient Lynn Logan, female DOB:[birth year], 46 y.o. JME:[id number]  Chief Complaint  Patient presents with  . Follow-up    knee pain    HPI  Lynn Logan is a 46 y.o. female who has chronic pain in both knees, more on the right.  She was to have gone to PT but they could not see her until next Monday.  She has swelling and popping but no giving way.  She has no new trauma.  She continues to smoke but has cut back to about a half a pack a day. HPI  Body mass index is 31.24 kg/m.  ROS  Review of Systems  Constitutional: Positive for fatigue.       She has no diabetes She has no hypertension She has Asthma She smokes   HENT: Negative for congestion.   Respiratory: Positive for shortness of breath and wheezing.   Cardiovascular: Negative for chest pain.  Endocrine: Positive for cold intolerance.  Musculoskeletal: Positive for arthralgias, back pain, gait problem, joint swelling and myalgias.  Allergic/Immunologic: Positive for environmental allergies.  Neurological: Negative for numbness.  Psychiatric/Behavioral: The patient is nervous/anxious.     Past Medical History:  Diagnosis Date  . Anxiety   . Arthritis    Rheumatoid  . Asthma   . Breast cancer ([place])   . CHF (congestive heart failure) ([place])   . Chronic back pain   . Colon cancer ([place])   . COPD (chronic obstructive pulmonary disease) ([place])   . Depression   . Fibromyalgia   . Gout   . Heart attack ([place]) 2017  . Renal disorder   . Right lumbar radiculopathy   . Vitiligo    face    Past Surgical History:  Procedure Laterality Date  . CESAREAN SECTION    . DILITATION & CURRETTAGE/HYSTROSCOPY WITH NOVASURE ABLATION N/A 07/07/2015   Procedure: HYSTEROSCOPY, UTERINE CURETTAGE, ENDOMETRIAL  ABLATION Uterine Cavity Length=6.5cm Uterine Cavity Width=4.5cm Power=161 Watts Time=1 minute 19 seconds;  Surgeon: Florian Buff, MD;  Location: [facility];  Service: Gynecology;  Laterality: N/A;  . POLYPECTOMY   07/07/2015   Procedure: POLYPECTOMY;  Surgeon: Florian Buff, MD;  Location: [facility];  Service: Gynecology;;  . SALPINGOOPHORECTOMY Bilateral 10/13/2015   Procedure: BILATERAL SALPINGO OOPHORECTOMY;  Surgeon: Florian Buff, MD;  Location: [facility];  Service: Gynecology;  Laterality: Bilateral;  . TUBAL LIGATION    . VAGINAL HYSTERECTOMY N/A 10/13/2015   Procedure: HYSTERECTOMY VAGINAL;  Surgeon: Florian Buff, MD;  Location: [facility];  Service: Gynecology;  Laterality: N/A;    Family History  Problem Relation Age of Onset  . Diabetes Mother   . Congestive Heart Failure Mother   . Depression Father   . Hypertension Father   . Cancer Father   . Heart disease Maternal Uncle     Social History Social History  Substance Use Topics  . Smoking status: Heavy Tobacco Smoker    Packs/day: 1.00    Years: 30.00    Types: Cigarettes  . Smokeless tobacco: Former Systems developer  . Alcohol use No    Allergies  Allergen Reactions  . Toradol [Ketorolac Tromethamine] Itching  . Buspirone Nausea And Vomiting  . Lyrica [Pregabalin] Nausea And Vomiting  . Gabapentin Nausea And Vomiting  . Tramadol Nausea And Vomiting  . Zofran [Ondansetron Hcl] Nausea And Vomiting  . Penicillins Rash    Has patient had a PCN reaction causing immediate rash, facial/tongue/throat swelling, SOB or lightheadedness with hypotension: No Has patient had a  PCN reaction causing severe rash involving mucus membranes or skin necrosis: No Has patient had a PCN reaction that required hospitalization No Has patient had a PCN reaction occurring within the last 10 years: yes If all of the above answers are "NO", then may proceed with Cephalosporin use.     Current Outpatient Prescriptions  Medication Sig Dispense Refill  . albuterol (PROVENTIL HFA;VENTOLIN HFA) 108 (90 Base) MCG/ACT inhaler Inhale 2 puffs into the lungs every 6 (six) hours as needed.    Marland Kitchen albuterol (PROVENTIL) (2.5 MG/3ML) 0.083% nebulizer solution Take 3 mLs (2.5 mg  total) by nebulization every 6 (six) hours as needed for wheezing or shortness of breath. 150 mL 1  . budesonide-formoterol (SYMBICORT) 160-4.5 MCG/ACT inhaler Inhale 2 puffs into the lungs 2 (two) times daily.    . citalopram (CELEXA) 20 MG tablet Take 1 tablet by mouth daily.    . clonazePAM (KLONOPIN) 0.5 MG tablet Take 1 tablet by mouth 3 (three) times daily.    . cyclobenzaprine (FLEXERIL) 10 MG tablet Take 1 tablet by mouth at bedtime.    . diclofenac sodium (VOLTAREN) 1 % GEL Apply 2 g topically 4 (four) times daily. (Patient not taking: Reported on 02/28/2017) 100 g 2  . DULoxetine (CYMBALTA) 60 MG capsule TAKE ONE CAPSULE BY MOUTH ONCE DAILY 30 capsule 5  . ergocalciferol (VITAMIN D2) 50000 units capsule Take 1 capsule by mouth once a week.    . estradiol (ESTRACE) 2 MG tablet Take 1 tablet (2 mg total) by mouth daily. 30 tablet 11  . ferrous sulfate 324 (65 Fe) MG TBEC Take 1 tablet (325 mg total) by mouth daily. 30 tablet 3  . HYDROcodone-acetaminophen (NORCO/VICODIN) 5-325 MG tablet Take 1 tablet by mouth every 6 (six) hours as needed for moderate pain (Must last 30 days.Do not take and drive a car or use machinery.). 50 tablet 0  . hydrOXYzine (ATARAX/VISTARIL) 25 MG tablet Take 1 tablet by mouth at bedtime.    . megestrol (MEGACE) 40 MG tablet Take 1 tablet (40 mg total) by mouth daily. 90 tablet 3  . naproxen (NAPROSYN) 500 MG tablet Take 1 tablet (500 mg total) by mouth 2 (two) times daily with a meal. 60 tablet 5  . piroxicam (FELDENE) 20 MG capsule Take 1 capsule (20 mg total) by mouth daily. 30 capsule 2  . polyethylene glycol powder (GLYCOLAX/MIRALAX) powder Take 17 g by mouth 2 (two) times daily as needed. 3350 g 1  . potassium chloride SA (K-DUR,KLOR-CON) 20 MEQ tablet Take 2 tablets (40 mEq total) by mouth 2 (two) times daily. 28 tablet 0  . pregabalin (LYRICA) 300 MG capsule Take 1 capsule (300 mg total) by mouth 2 (two) times daily. 60 capsule 1  . promethazine (PHENERGAN)  25 MG tablet Take 1 tablet (25 mg total) by mouth every 6 (six) hours as needed. 12 tablet 1  . RaNITidine HCl (ZANTAC PO) Take 75 mg by mouth daily as needed (acid reflux).     Marland Kitchen rOPINIRole (REQUIP) 1 MG tablet Take 1 tablet by mouth at bedtime.    . traZODone (DESYREL) 150 MG tablet Take 1 tablet by mouth at bedtime.  2   No current facility-administered medications for this visit.      Physical Exam  Blood pressure 122/81, pulse 64, temperature (!) 97.4 F (36.3 C), height 5\' 4"  (1.626 m), weight 182 lb (82.6 kg), last menstrual period 09/12/2015.  Constitutional: overall normal hygiene, normal nutrition, well developed, normal grooming, normal  body habitus. Assistive device:none  Musculoskeletal: gait and station Limp right, muscle tone and strength are normal, no tremors or atrophy is present.  .  Neurological: coordination overall normal.  Deep tendon reflex/nerve stretch intact.  Sensation normal.  Cranial nerves II-XII intact.   Skin:   Normal overall no scars, lesions, ulcers or rashes. No psoriasis.  Psychiatric: Alert and oriented x 3.  Recent memory intact, remote memory unclear.  Normal mood and affect. Well groomed.  Good eye contact.  Cardiovascular: overall no swelling, no varicosities, no edema bilaterally, normal temperatures of the legs and arms, no clubbing, cyanosis and good capillary refill.  Lymphatic: palpation is normal.  The right lower extremity is examined:  Inspection:  Thigh:  Non-tender and no defects  Knee has swelling 1+ effusion.                        Joint tenderness is not present                        Patient is tender over the medial joint line  Lower Leg:  Has normal appearance and no tenderness or defects  Ankle:  Non-tender and no defects  Foot:  Non-tender and no defects Range of Motion:  Knee:  Range of motion is: 0-105                        Crepitus is  present  Ankle:  Range of motion is normal. Strength and Tone:  The right  lower extremity has normal strength and tone. Stability:  Knee:  The knee is stable.  Ankle:  The ankle is stable.    The patient has been educated about the nature of the problem(s) and counseled on treatment options.  The patient appeared to understand what I have discussed and is in agreement with it.  Encounter Diagnoses  Name Primary?  . Chronic pain of right knee Yes  . Chronic pain of left knee   . Cigarette nicotine dependence without complication     PLAN Call if any problems.  Precautions discussed.  Continue current medications.   Return to clinic 3 weeks   Begin PT.  Electronically Signed Sanjuana Kava, MD 7/26/20189:44 AM

## 2017-04-10 ENCOUNTER — Ambulatory Visit (HOSPITAL_COMMUNITY): Payer: Medicare Other | Admitting: Physical Therapy

## 2017-04-10 ENCOUNTER — Encounter (HOSPITAL_COMMUNITY): Payer: Self-pay | Admitting: Physical Therapy

## 2017-04-10 ENCOUNTER — Other Ambulatory Visit (HOSPITAL_COMMUNITY): Payer: Self-pay | Admitting: Respiratory Therapy

## 2017-04-10 DIAGNOSIS — M25561 Pain in right knee: Secondary | ICD-10-CM | POA: Diagnosis present

## 2017-04-10 DIAGNOSIS — M6281 Muscle weakness (generalized): Secondary | ICD-10-CM

## 2017-04-10 DIAGNOSIS — R2681 Unsteadiness on feet: Secondary | ICD-10-CM

## 2017-04-10 DIAGNOSIS — G8929 Other chronic pain: Secondary | ICD-10-CM | POA: Diagnosis present

## 2017-04-10 DIAGNOSIS — J441 Chronic obstructive pulmonary disease with (acute) exacerbation: Secondary | ICD-10-CM

## 2017-04-10 NOTE — Therapy (Signed)
[facility] [place], [place], [address] Phone: [phone]   Fax:  [phone]  Physical Therapy Evaluation  Patient Details  Name: Lynn Logan MRN: [id number] Date of Birth: [birth year] Referring Provider: Sanjuana Kava  Encounter Date: 04/10/2017      PT End of Session - 04/10/17 1629    Visit Number 1   Number of Visits 7   Date for PT Re-Evaluation 05/01/17   Authorization Type [employer] Medicare    Authorization Time Period 04/10/17 to 05/01/17   Authorization - Visit Number 1   Authorization - Number of Visits 10   PT Start Time 3244   PT Stop Time 1343   PT Time Calculation (min) 38 min   Activity Tolerance Patient tolerated treatment well   Behavior During Therapy [facility] for tasks assessed/performed      Past Medical History:  Diagnosis Date  . Anxiety   . Arthritis    Rheumatoid  . Asthma   . Breast cancer ([place])   . CHF (congestive heart failure) ([place])   . Chronic back pain   . Colon cancer ([place])   . COPD (chronic obstructive pulmonary disease) ([place])   . Depression   . Fibromyalgia   . Gout   . Heart attack ([place]) 2017  . Renal disorder   . Right lumbar radiculopathy   . Vitiligo    face    Past Surgical History:  Procedure Laterality Date  . CESAREAN SECTION    . DILITATION & CURRETTAGE/HYSTROSCOPY WITH NOVASURE ABLATION N/A 07/07/2015   Procedure: HYSTEROSCOPY, UTERINE CURETTAGE, ENDOMETRIAL  ABLATION Uterine Cavity Length=6.5cm Uterine Cavity Width=4.5cm Power=161 Watts Time=1 minute 19 seconds;  Surgeon: Florian Buff, MD;  Location: [facility];  Service: Gynecology;  Laterality: N/A;  . POLYPECTOMY  07/07/2015   Procedure: POLYPECTOMY;  Surgeon: Florian Buff, MD;  Location: [facility];  Service: Gynecology;;  . SALPINGOOPHORECTOMY Bilateral 10/13/2015   Procedure: BILATERAL SALPINGO OOPHORECTOMY;  Surgeon: Florian Buff, MD;  Location: [facility];  Service: Gynecology;  Laterality: Bilateral;  . TUBAL  LIGATION    . VAGINAL HYSTERECTOMY N/A 10/13/2015   Procedure: HYSTERECTOMY VAGINAL;  Surgeon: Florian Buff, MD;  Location: [facility];  Service: Gynecology;  Laterality: N/A;    There were no vitals filed for this visit.       Subjective Assessment - 04/10/17 1306    Subjective Patient arrives stating that her knee has been hurting due to arthritis; she is supposed to have surgery next month, but they won't do her knee surgery until they do surgery for her pacemaker. She has not slept but 8 hours in 2 months. She falls all the time.    Pertinent History CHF, COPD, neuropathy, fibromyalgia, breast and colon cancer, chronic back pain, gout, history of MI    How long can you sit comfortably? 5 minutes    How long can you stand comfortably? 2 seconds    How long can you walk comfortably? 10 minutes    Patient Stated Goals reduce pain    Currently in Pain? Yes   Pain Score 5    Pain Location Knee   Pain Orientation Right   Pain Descriptors / Indicators Sharp   Pain Type Chronic pain   Pain Radiating Towards none    Pain Onset More than a month ago   Pain Frequency Constant   Aggravating Factors  standing too long    Pain Relieving Factors nothing    Effect  of Pain on Daily Activities moderate impact             [facility] PT Assessment - 04/10/17 0001      Assessment   Medical Diagnosis R knee pain    Referring Provider Sanjuana Kava   Onset Date/Surgical Date --  chronic    Next MD Visit Dr. Luna Glasgow on the 8th     Prior Therapy none      Precautions   Precautions Fall;Other (comment)   Precaution Comments active cancer     Balance Screen   Has the patient fallen in the past 6 months Yes   How many times? 20   Has the patient had a decrease in activity level because of a fear of falling?  Yes   Is the patient reluctant to leave their home because of a fear of falling?  No     Prior Function   Level of Independence Independent;Independent with gait;Independent with  transfers;Independent with basic ADLs   Vocation On disability   Leisure play with dog      AROM   Right Knee Extension -6   Right Knee Flexion 118     Strength   Right Hip Flexion 4/5   Right Hip Extension 2/5   Right Hip ABduction 3/5   Left Hip Flexion 3/5   Left Hip Extension 2-/5   Left Hip ABduction 2+/5   Right Knee Flexion 3/5   Right Knee Extension 4/5   Left Knee Flexion 2+/5   Left Knee Extension 3/5   Right Ankle Dorsiflexion 4/5   Left Ankle Dorsiflexion 3/5     Transfers   Five time sit to stand comments  17.93  uanble to come to full stand     Ambulation/Gait   Gait Comments noted lack of antalgic pattern; significatn difficulty with stairs including step to pattern, eccentric weakness      High Level Balance   High Level Balance Comments TUG 11.73 no device             Objective measurements completed on examination: See above findings.                  PT Education - 04/10/17 1628    Education provided Yes   Education Details extensive education on large HEP with correction for form, POC moving forward; due to ongoing medical issues, please call us if she needs schedule adjusted    Person(s) Educated Patient   Methods Explanation;Demonstration;Handout   Comprehension Verbalized understanding;Returned demonstration;Need further instruction          PT Short Term Goals - 04/10/17 1634      PT SHORT TERM GOAL #1   Title Patient to be able to do 5x sit to stand with no UEs and no antalgic movement, equal weight bearing B LEs, in order to show improved functional mobility    Time 3   Period Weeks   Status New   Target Date 05/01/17     PT SHORT TERM GOAL #2   Title Patient to be able to identify 5/5 ways to prevent a fall/reduce fall risk in order to improve safety with mobiltiy    Time 3   Period Weeks   Status New     PT SHORT TERM GOAL #3   Title Patient to show strength as having improved by 1 MMT grade in all tested  groups to assist in reducing knee pain and improving mobility    Time 3  Period Weeks   Status New     PT SHORT TERM GOAL #4   Title Patient to experience no more than 2/10 R knee in order to show improvement of overall condition    Time 3   Period Weeks   Status New     PT SHORT TERM GOAL #5   Title Patient to be independent in correct performance of HEP, to be updated PRN    Time 1   Period Weeks   Status New   Target Date 04/17/17           PT Long Term Goals - 04/10/17 1637      PT LONG TERM GOAL #1   Title No LTGs appropriate at time of initial eval; may update if patient shows good improvement at time of re-assessment on 8/21                 Plan - 04/10/17 1630    Clinical Impression Statement Prior to evaluation, observed patient ambulating across the street and sitting down/getting up from low curb without difficulty or signs of antalgia R knee; general functional screen appears to reveal generally WFL movement pattern with observation from a distance. Patient arrives reporting that her R knee has given her trouble for some time, she also reports severe difficulty due to scoliosis and multiple sites of active cancer. Evaluation/examination initially shows significant functional weakness,  chronic compensation patterns, and difficulty in tolerating functional tasks. However note that patient also reveals discrepancies in measures such as antalgic 5 time sit to stand test (no antalgia noted with sit to stand from low curb); initially very weak quad and hamstring strength but reveals ability to perform knee exercises with resistance of therband; and lack of antalgic pattern that would be consistent with knee pain to extent and chronicity as described by patient. Recommend trial of skilled PT services to attempt functional deficits before hard DC to functional independent exercise program.    History and Personal Factors relevant to plan of care: complex medical and  psychosocial history, chronicity of impairments    Clinical Presentation Stable   Clinical Presentation due to: chronic deconditioning and complex medical/psychosocial history    Rehab Potential Fair   Clinical Impairments Affecting Rehab Potential (+) high PLOF; (-) complex PMH and psychosocial history    PT Frequency 2x / week   PT Duration 3 weeks   PT Treatment/Interventions ADLs/Self Care Home Management;Cryotherapy;Moist Heat;DME Instruction;Gait training;Stair training;Functional mobility training;Therapeutic activities;Therapeutic exercise;Balance training;Neuromuscular re-education;Patient/family education;Manual techniques   PT Next Visit Plan review initial eval and goals: focus on functional strength and balance, HEP updates as appropraite    PT Home Exercise Plan Eval: bridge, SLR, supine hip ABD, supine clams TB, TB LAQs and HS curls, tandem stance    Consulted and Agree with Plan of Care Patient      Patient will benefit from skilled therapeutic intervention in order to improve the following deficits and impairments:  Pain, Decreased coordination, Decreased activity tolerance, Decreased range of motion, Decreased strength, Decreased balance, Difficulty walking  Visit Diagnosis: Chronic pain of right knee - Plan: PT plan of care cert/re-cert  Muscle weakness (generalized) - Plan: PT plan of care cert/re-cert  Unsteadiness on feet - Plan: PT plan of care cert/re-cert      G-Codes - 41/28/78 1638    Functional Assessment Tool Used (Outpatient Only) Based on skilled PT services strength, ROM, balance, fall risk    Functional Limitation Mobility: Walking and moving around  Mobility: Walking and Moving Around Current Status [phone]) At least 40 percent but less than 60 percent impaired, limited or restricted   Mobility: Walking and Moving Around Goal Status [phone]) At least 20 percent but less than 40 percent impaired, limited or restricted       Problem List Patient  Active Problem List   Diagnosis Date Noted  . Hypersomnia 05/11/2016  . COPD (chronic obstructive pulmonary disease) ([place]) 05/11/2016  . Tobacco user 05/11/2016  . Diastolic CHF ([place]) 75/91/6384  . History of orthopnea 04/06/2016  . Gall bladder polyp 03/09/2016  . Reactive airway disease 02/21/2016  . Healthcare maintenance 02/10/2016  . Arthritis of knee 01/24/2016  . Microcytic anemia 12/31/2015  . Obesity 11/26/2015  . Fibromyalgia 11/26/2015  . Stress incontinence 11/26/2015  . Right knee pain 11/16/2015  . Left knee pain 11/16/2015  . Knee pain 11/09/2015  . S/P vaginal hysterectomy 10/13/2015  . Right arm pain 06/24/2015  . Right leg pain 06/24/2015  . Back pain 06/11/2015  . Menorrhagia 05/21/2015  . Mallory-Weiss tear 05/21/2015  . Peripheral neuropathy 05/21/2015  . Depression 05/21/2015    Deniece Ree PT, DPT [phone]  [facility] [address] [place], [place], [address] Phone: [phone]   Fax:  [phone]  Name: Lynn Logan MRN: [id number] Date of Birth: Jan 01, 1971

## 2017-04-10 NOTE — Patient Instructions (Signed)
   BRIDGING  While lying on your back, tighten your lower abdominals, squeeze your buttocks and then raise your buttocks off the floor/bed as creating a "Bridge" with your body.   Lift your hips as high as you can.  Repeat 10 times, twice a day.    STRAIGHT LEG RAISE - SLR  While lying on your back, raise up your leg with a straight knee.  Keep the opposite knee bent with the foot planted on the ground.  Repeat 10 times each leg, twice a day.     ELASTIC BAND - SUPINE HIP ABDUCTION  While lying on your back and the red band around your ankles, slowly bring your leg out to the side. Keep  your knee straight the entire time.  Lead with your heel, your toes should stay facing the ceiling.  Repeat 10 times each leg, twice a day.     SUPINE HIP ABDUCTION - ELASTIC BAND CLAMS  Lie down on your back with your knees bent. Place an elastic band around your knees and then draw your knees apart.  Hold for 2 seconds.  Repeat 10 times, twice a day.     Seated LAQ with band  Start sitting with band under arch of unaffected leg and around ankle of affected leg. Straighten your leg and slowly return to starting position keeping knees tight together.   Repeat 10 times each side, twice a day.    ELASTIC BAND - HAMSTRING CURL  While seated and an elastic band attched to your ankle, bend your knee and draw back your foot.  Repeat 10 times each side, twice a day.     TANDEM STANCE BALANCE (AT KITCHEN COUNTER)  Stand and balnace in tandem stance. Hold this position for 10-15 seconds then switch your feet.  Repeat 2 times each way, twice a day.

## 2017-04-11 ENCOUNTER — Telehealth: Payer: Self-pay | Admitting: Orthopaedic Surgery

## 2017-04-11 NOTE — Telephone Encounter (Signed)
Lynn Logan called and stated that she fell on her steps this morning and hurt her head.  She states that she passed out the other day and she fell yesterday while at PT.  I asked her if she was going to the ER and she said she was as soon as her son could come and get her which would be around noon.    She just wanted [name] to know.

## 2017-04-12 ENCOUNTER — Ambulatory Visit (HOSPITAL_COMMUNITY): Payer: Medicare Other | Admitting: Physical Therapy

## 2017-04-12 ENCOUNTER — Telehealth (HOSPITAL_COMMUNITY): Payer: Self-pay | Admitting: Nurse Practitioner

## 2017-04-12 NOTE — Telephone Encounter (Signed)
04/12/17  [place] caller said she wouldn't be here today, no reason was given

## 2017-04-13 ENCOUNTER — Ambulatory Visit (HOSPITAL_COMMUNITY)
Admission: RE | Admit: 2017-04-13 | Discharge: 2017-04-13 | Disposition: A | Discharge status: Home / Self Care | Payer: Medicare Other | Source: Ambulatory Visit | Attending: Pulmonary Disease | Admitting: Pulmonary Disease

## 2017-04-13 DIAGNOSIS — J441 Chronic obstructive pulmonary disease with (acute) exacerbation: Secondary | ICD-10-CM | POA: Insufficient documentation

## 2017-04-13 MED ORDER — ALBUTEROL SULFATE (2.5 MG/3ML) 0.083% IN NEBU
2.5000 mg | INHALATION_SOLUTION | Freq: Once | RESPIRATORY_TRACT | Status: AC
Start: 2017-04-13 — End: 2017-04-13
  Administered 2017-04-13: 2.5 mg via RESPIRATORY_TRACT

## 2017-04-16 ENCOUNTER — Encounter: Payer: Self-pay | Admitting: Cardiovascular Disease

## 2017-04-16 ENCOUNTER — Ambulatory Visit: Payer: Medicare Other | Admitting: Cardiovascular Disease

## 2017-04-17 ENCOUNTER — Other Ambulatory Visit: Payer: Self-pay | Admitting: Orthopaedic Surgery

## 2017-04-17 ENCOUNTER — Ambulatory Visit (HOSPITAL_COMMUNITY): Payer: Medicare Other | Attending: Orthopaedic Surgery

## 2017-04-17 ENCOUNTER — Telehealth (HOSPITAL_COMMUNITY): Payer: Self-pay | Admitting: Physical Therapy

## 2017-04-17 LAB — PULMONARY FUNCTION TEST
FEF 25-75 PRE: 1.88 L/s
FEF2575-%Pred-Pre: 65 %
FEV1-%CHANGE-POST: -15 %
FEV1-%PRED-POST: 57 %
FEV1-%PRED-PRE: 68 %
FEV1-POST: 1.67 L
FEV1-Pre: 1.97 L
FEV1FVC-%Change-Post: 13 %
FEV1FVC-%Pred-Pre: 108 %
FEV6-%Change-Post: -25 %
FEV6-%Pred-Post: 47 %
FEV6-%Pred-Pre: 63 %
FEV6-POST: 1.67 L
FEV6-Pre: 2.23 L
FEV6FVC-%PRED-POST: 102 %
FEV6FVC-%Pred-Pre: 102 %
FVC-%Change-Post: -25 %
FVC-%PRED-PRE: 62 %
FVC-%Pred-Post: 46 %
FVC-POST: 1.67 L
FVC-PRE: 2.23 L
POST FEV1/FVC RATIO: 100 %
PRE FEV1/FVC RATIO: 88 %
PRE FEV6/FVC RATIO: 100 %
Post FEV6/FVC ratio: 100 %
RV % PRED: 135 %
RV: 2.33 L
TLC % pred: 97 %
TLC: 4.92 L

## 2017-04-17 NOTE — Telephone Encounter (Signed)
Patient requests refill (states MyChart access not going through):  HYDROcodone-acetaminophen (NORCO/VICODIN) 5-325 MG tablet 50 tablet

## 2017-04-17 NOTE — Telephone Encounter (Signed)
Needs to wait. She had narcotic filled on 04-12-17.  Wait a week.

## 2017-04-17 NOTE — Telephone Encounter (Signed)
No show, called and left message concerning missed apt today.  Reminded next apt date and time with contact information included.  Included the 2 no show policy in message as well.    [address] [place]; CBIS [phone]

## 2017-04-17 NOTE — Telephone Encounter (Signed)
Called Dr. Brooke Bonito office to F/U with [place] regarding patient's report that she had fallen at PT. Dicussed that patient did not fall at PT as well as multiple inconsistencies found during PT evaluation, MD office reports they have noticed similar inconsistencies on their end. Plan to monitor patient and F/U with MD office in event of any major changes or occurrences.  Deniece Ree PT, DPT [phone]

## 2017-04-18 NOTE — Telephone Encounter (Signed)
Closing note, as unable to reach patient at either phone# to relay message per Dr. Luna Glasgow.

## 2017-04-19 ENCOUNTER — Ambulatory Visit (HOSPITAL_COMMUNITY): Payer: Medicare Other

## 2017-04-23 ENCOUNTER — Telehealth: Payer: Self-pay | Admitting: Orthopedic Surgery

## 2017-04-24 ENCOUNTER — Ambulatory Visit (HOSPITAL_COMMUNITY): Payer: Medicare Other

## 2017-04-24 MED ORDER — HYDROCODONE-ACETAMINOPHEN 5-325 MG PO TABS: 1.0000 | ORAL_TABLET | Freq: Four times a day (QID) | ORAL | 0 refills | Status: DC | PRN

## 2017-04-24 NOTE — Telephone Encounter (Signed)
No show, called and spoke to pt who stated she missed apt due to receiving oxygen in her home prior CPAP for sleep assistance.  Reminded next apt date and time and reviewed no show policy, pt stated she plans to make it.    [address], [place]; CBIS [phone]'

## 2017-04-26 ENCOUNTER — Telehealth (HOSPITAL_COMMUNITY): Payer: Self-pay | Admitting: Nurse Practitioner

## 2017-04-26 ENCOUNTER — Encounter: Payer: Self-pay | Admitting: Orthopedic Surgery

## 2017-04-26 ENCOUNTER — Ambulatory Visit (HOSPITAL_COMMUNITY): Payer: Medicare Other

## 2017-04-26 ENCOUNTER — Ambulatory Visit: Payer: Medicare Other | Admitting: Orthopaedic Surgery

## 2017-04-26 ENCOUNTER — Encounter: Payer: Self-pay | Admitting: Orthopaedic Surgery

## 2017-04-26 NOTE — Telephone Encounter (Signed)
816/18  Pt cx because she had 3 teeth pulled today

## 2017-05-01 ENCOUNTER — Telehealth (HOSPITAL_COMMUNITY): Payer: Self-pay

## 2017-05-01 ENCOUNTER — Ambulatory Visit (HOSPITAL_COMMUNITY): Payer: Medicare Other

## 2017-05-01 NOTE — Telephone Encounter (Signed)
No show, called and left message about missed apt today.  Included next apt date and time with contact informatin given.    [address], [place]; CBIS [phone]

## 2017-05-01 NOTE — Telephone Encounter (Signed)
Pt called 05/01/17 at 17:43 stating she missed PT apt today due to just being released from hospital due to heart attack.  Reports need to cancel next weeks apt.s due to open heart surgery scheduled for next week.  Stated she will be at next apt scheduled for 8/23 at 4.    [address], [place]; CBIS [phone]

## 2017-05-01 NOTE — Telephone Encounter (Signed)
No show, called and left message about missed apt.  Included next apt date and time with contact information given.  [address], [place]; CBIS [phone]

## 2017-05-02 ENCOUNTER — Telehealth (HOSPITAL_COMMUNITY): Payer: Self-pay

## 2017-05-02 ENCOUNTER — Encounter (HOSPITAL_COMMUNITY): Payer: Self-pay | Admitting: Physical Therapy

## 2017-05-02 NOTE — Telephone Encounter (Signed)
Following discussion of recent heart attack per pt. contacted MD who advised to cancel PT sessions.  Called and left message on answering machine informing DC from PT per MD.  [address], [place]; CBIS [phone]

## 2017-05-02 NOTE — Therapy (Signed)
[facility] [place], [place], [address] Phone: [phone]   Fax:  [phone]  Patient Details  Name: Lynn Logan MRN: [id number] Date of Birth: [birth year] Referring Provider:  No ref. provider found  Encounter Date: 05/02/2017   PHYSICAL THERAPY DISCHARGE SUMMARY  Visits from Start of Care: 1  Current functional level related to goals / functional outcomes: Patient has not attended skilled PT services since the time of evaluation; she recently called and reported she had a heart attack a couple of days ago, PT staff unable to confirm this in chart and unable to verify this as well as other patient reported health/medical problems. Contacted referring MD regarding patient status, who advises to DC patient from skilled PT services at this point regardless of accuracy of patient statements.    Remaining deficits: Unable to assess    Education / Equipment: None  Plan: Patient agrees to discharge.  Patient goals were not met. Patient is being discharged due to the physician's request.  ?????       Deniece Ree PT, DPT [place], [place], [address] Phone: [phone]   Fax:  [phone]

## 2017-05-03 ENCOUNTER — Ambulatory Visit (HOSPITAL_COMMUNITY): Payer: Medicare Other | Admitting: Physical Therapy

## 2017-05-08 ENCOUNTER — Ambulatory Visit (HOSPITAL_COMMUNITY): Payer: Medicare Other | Admitting: Physical Therapy

## 2017-05-10 ENCOUNTER — Encounter (HOSPITAL_COMMUNITY): Payer: Medicare Other | Admitting: Physical Therapy

## 2017-05-15 ENCOUNTER — Encounter (HOSPITAL_COMMUNITY): Payer: Medicare Other | Admitting: Physical Therapy

## 2017-05-23 ENCOUNTER — Ambulatory Visit (INDEPENDENT_AMBULATORY_CARE_PROVIDER_SITE_OTHER): Payer: Medicare Other | Admitting: Orthopaedic Surgery

## 2017-05-23 VITALS — BP 121/86 | HR 66 | Temp 97.7°F | Ht 64.0 in | Wt 175.0 lb

## 2017-05-23 DIAGNOSIS — F17210 Nicotine dependence, cigarettes, uncomplicated: Secondary | ICD-10-CM

## 2017-05-23 DIAGNOSIS — M25561 Pain in right knee: Secondary | ICD-10-CM

## 2017-05-23 DIAGNOSIS — M25562 Pain in left knee: Secondary | ICD-10-CM | POA: Diagnosis not present

## 2017-05-23 DIAGNOSIS — G8929 Other chronic pain: Secondary | ICD-10-CM

## 2017-05-23 MED ORDER — HYDROCODONE-ACETAMINOPHEN 5-325 MG PO TABS: 1.0000 | ORAL_TABLET | Freq: Four times a day (QID) | ORAL | 0 refills | Status: DC | PRN

## 2017-05-23 NOTE — Patient Instructions (Signed)
Steps to Quit Smoking Smoking tobacco can be bad for your health. It can also affect almost every organ in your body. Smoking puts you and people around you at risk for many serious Autum Benfer-lasting (chronic) diseases. Quitting smoking is hard, but it is one of the best things that you can do for your health. It is never too late to quit. What are the benefits of quitting smoking? When you quit smoking, you lower your risk for getting serious diseases and conditions. They can include:  Lung cancer or lung disease.  Heart disease.  Stroke.  Heart attack.  Not being able to have children (infertility).  Weak bones (osteoporosis) and broken bones (fractures).  If you have coughing, wheezing, and shortness of breath, those symptoms may get better when you quit. You may also get sick less often. If you are pregnant, quitting smoking can help to lower your chances of having a baby of low birth weight. What can I do to help me quit smoking? Talk with your doctor about what can help you quit smoking. Some things you can do (strategies) include:  Quitting smoking totally, instead of slowly cutting back how much you smoke over a period of time.  Going to in-person counseling. You are more likely to quit if you go to many counseling sessions.  Using resources and support systems, such as: ? Online chats with a counselor. ? Phone quitlines. ? Printed self-help materials. ? Support groups or group counseling. ? Text messaging programs. ? Mobile phone apps or applications.  Taking medicines. Some of these medicines may have nicotine in them. If you are pregnant or breastfeeding, do not take any medicines to quit smoking unless your doctor says it is okay. Talk with your doctor about counseling or other things that can help you.  Talk with your doctor about using more than one strategy at the same time, such as taking medicines while you are also going to in-person counseling. This can help make  quitting easier. What things can I do to make it easier to quit? Quitting smoking might feel very hard at first, but there is a lot that you can do to make it easier. Take these steps:  Talk to your family and friends. Ask them to support and encourage you.  Call phone quitlines, reach out to support groups, or work with a counselor.  Ask people who smoke to not smoke around you.  Avoid places that make you want (trigger) to smoke, such as: ? Bars. ? Parties. ? Smoke-break areas at work.  Spend time with people who do not smoke.  Lower the stress in your life. Stress can make you want to smoke. Try these things to help your stress: ? Getting regular exercise. ? Deep-breathing exercises. ? Yoga. ? Meditating. ? Doing a body scan. To do this, close your eyes, focus on one area of your body at a time from head to toe, and notice which parts of your body are tense. Try to relax the muscles in those areas.  Download or buy apps on your mobile phone or tablet that can help you stick to your quit plan. There are many free apps, such as QuitGuide from the CDC (Centers for Disease Control and Prevention). You can find more support from smokefree.gov and other websites.  This information is not intended to replace advice given to you by your health care provider. Make sure you discuss any questions you have with your health care provider. Document Released: 06/24/2009 Document   Revised: 04/25/2016 Document Reviewed: 01/12/2015 Elsevier Interactive Patient Education  2018 Elsevier Inc.  

## 2017-05-23 NOTE — Progress Notes (Signed)
Patient Lynn Logan, female DOB:1970/12/13, 46 y.o. EGB:[id number]  Chief Complaint  Patient presents with  . Follow-up    chronic pain right knee    HPI  Lynn Logan is a 46 y.o. female who has bilateral knee pain.  She has no new trauma. She has no giving way. She has had dizziness spells and is seeing her neurologist for this.  She is to have breast reduction surgery next month.  She recently had dental work and got additional pain medicine because of that. HPI  Body mass index is 30.04 kg/m.  ROS  Review of Systems  Constitutional: Positive for fatigue.       She has no diabetes She has no hypertension She has Asthma She smokes   HENT: Negative for congestion.   Respiratory: Positive for shortness of breath and wheezing.   Cardiovascular: Negative for chest pain.  Endocrine: Positive for cold intolerance.  Musculoskeletal: Positive for arthralgias, back pain, gait problem, joint swelling and myalgias.  Allergic/Immunologic: Positive for environmental allergies.  Neurological: Negative for numbness.  Psychiatric/Behavioral: The patient is nervous/anxious.     Past Medical History:  Diagnosis Date  . Anxiety   . Arthritis    Rheumatoid  . Asthma   . Breast cancer ([place])   . CHF (congestive heart failure) ([place])   . Chronic back pain   . Colon cancer ([place])   . COPD (chronic obstructive pulmonary disease) ([place])   . Depression   . Fibromyalgia   . Gout   . Heart attack ([place]) 2017  . Renal disorder   . Right lumbar radiculopathy   . Vitiligo    face    Past Surgical History:  Procedure Laterality Date  . CESAREAN SECTION    . DILITATION & CURRETTAGE/HYSTROSCOPY WITH NOVASURE ABLATION N/A 07/07/2015   Procedure: HYSTEROSCOPY, UTERINE CURETTAGE, ENDOMETRIAL  ABLATION Uterine Cavity Length=6.5cm Uterine Cavity Width=4.5cm Power=161 Watts Time=1 minute 19 seconds;  Surgeon: Florian Buff, MD;  Location: [facility];  Service: Gynecology;  Laterality: N/A;  .  POLYPECTOMY  07/07/2015   Procedure: POLYPECTOMY;  Surgeon: Florian Buff, MD;  Location: [facility];  Service: Gynecology;;  . SALPINGOOPHORECTOMY Bilateral 10/13/2015   Procedure: BILATERAL SALPINGO OOPHORECTOMY;  Surgeon: Florian Buff, MD;  Location: [facility];  Service: Gynecology;  Laterality: Bilateral;  . TUBAL LIGATION    . VAGINAL HYSTERECTOMY N/A 10/13/2015   Procedure: HYSTERECTOMY VAGINAL;  Surgeon: Florian Buff, MD;  Location: [facility];  Service: Gynecology;  Laterality: N/A;    Family History  Problem Relation Age of Onset  . Diabetes Mother   . Congestive Heart Failure Mother   . Depression Father   . Hypertension Father   . Cancer Father   . Heart disease Maternal Uncle     Social History Social History  Substance Use Topics  . Smoking status: Heavy Tobacco Smoker    Packs/day: 1.00    Years: 30.00    Types: Cigarettes  . Smokeless tobacco: Former Systems developer  . Alcohol use No    Allergies  Allergen Reactions  . Toradol [Ketorolac Tromethamine] Itching  . Buspirone Nausea And Vomiting  . Lyrica [Pregabalin] Nausea And Vomiting  . Gabapentin Nausea And Vomiting  . Tramadol Nausea And Vomiting  . Zofran [Ondansetron Hcl] Nausea And Vomiting  . Penicillins Rash    Has patient had a PCN reaction causing immediate rash, facial/tongue/throat swelling, SOB or lightheadedness with hypotension: No Has patient had a PCN reaction causing severe rash involving mucus  membranes or skin necrosis: No Has patient had a PCN reaction that required hospitalization No Has patient had a PCN reaction occurring within the last 10 years: yes If all of the above answers are "NO", then may proceed with Cephalosporin use.     Current Outpatient Prescriptions  Medication Sig Dispense Refill  . albuterol (PROVENTIL HFA;VENTOLIN HFA) 108 (90 Base) MCG/ACT inhaler Inhale 2 puffs into the lungs every 6 (six) hours as needed.    Marland Kitchen albuterol (PROVENTIL) (2.5 MG/3ML) 0.083% nebulizer solution Take 3 mLs  (2.5 mg total) by nebulization every 6 (six) hours as needed for wheezing or shortness of breath. 150 mL 1  . budesonide-formoterol (SYMBICORT) 160-4.5 MCG/ACT inhaler Inhale 2 puffs into the lungs 2 (two) times daily.    . citalopram (CELEXA) 20 MG tablet Take 1 tablet by mouth daily.    . clonazePAM (KLONOPIN) 0.5 MG tablet Take 1 tablet by mouth 3 (three) times daily.    . cyclobenzaprine (FLEXERIL) 10 MG tablet Take 1 tablet by mouth at bedtime.    . diclofenac sodium (VOLTAREN) 1 % GEL Apply 2 g topically 4 (four) times daily. (Patient not taking: Reported on 02/28/2017) 100 g 2  . DULoxetine (CYMBALTA) 60 MG capsule TAKE ONE CAPSULE BY MOUTH ONCE DAILY 30 capsule 5  . estradiol (ESTRACE) 2 MG tablet Take 1 tablet (2 mg total) by mouth daily. 30 tablet 11  . ferrous sulfate 324 (65 Fe) MG TBEC Take 1 tablet (325 mg total) by mouth daily. 30 tablet 3  . HYDROcodone-acetaminophen (NORCO/VICODIN) 5-325 MG tablet Take 1 tablet by mouth every 6 (six) hours as needed for moderate pain (Must last 30 days.Do not take and drive a car or use machinery.). 30 tablet 0  . hydrOXYzine (ATARAX/VISTARIL) 25 MG tablet Take 1 tablet by mouth at bedtime.    . megestrol (MEGACE) 40 MG tablet Take 1 tablet (40 mg total) by mouth daily. 90 tablet 3  . naproxen (NAPROSYN) 500 MG tablet Take 1 tablet (500 mg total) by mouth 2 (two) times daily with a meal. 60 tablet 5  . piroxicam (FELDENE) 20 MG capsule Take 1 capsule (20 mg total) by mouth daily. 30 capsule 2  . polyethylene glycol powder (GLYCOLAX/MIRALAX) powder Take 17 g by mouth 2 (two) times daily as needed. 3350 g 1  . potassium chloride SA (K-DUR,KLOR-CON) 20 MEQ tablet Take 2 tablets (40 mEq total) by mouth 2 (two) times daily. 28 tablet 0  . pregabalin (LYRICA) 300 MG capsule Take 1 capsule (300 mg total) by mouth 2 (two) times daily. 60 capsule 1  . promethazine (PHENERGAN) 25 MG tablet Take 1 tablet (25 mg total) by mouth every 6 (six) hours as needed.  12 tablet 1  . RaNITidine HCl (ZANTAC PO) Take 75 mg by mouth daily as needed (acid reflux).     Marland Kitchen rOPINIRole (REQUIP) 1 MG tablet Take 1 tablet by mouth at bedtime.    . traZODone (DESYREL) 150 MG tablet Take 1 tablet by mouth at bedtime.  2   No current facility-administered medications for this visit.      Physical Exam  Blood pressure 121/86, pulse 66, temperature 97.7 F (36.5 C), height 5\' 4"  (1.626 m), weight 175 lb (79.4 kg), last menstrual period 09/12/2015.  Constitutional: overall normal hygiene, normal nutrition, well developed, normal grooming, normal body habitus. Assistive device:none  Musculoskeletal: gait and station Limp left, muscle tone and strength are normal, no tremors or atrophy is present.  Marland Kitchen  Neurological: coordination overall normal.  Deep tendon reflex/nerve stretch intact.  Sensation normal.  Cranial nerves II-XII intact.   Skin:   Normal overall no scars, lesions, ulcers or rashes. No psoriasis.  Psychiatric: Alert and oriented x 3.  Recent memory intact, remote memory unclear.  Normal mood and affect. Well groomed.  Good eye contact.  Cardiovascular: overall no swelling, no varicosities, no edema bilaterally, normal temperatures of the legs and arms, no clubbing, cyanosis and good capillary refill.  Lymphatic: palpation is normal.  All other systems reviewed and are negative  The bilateral lower extremity is examined:  Inspection:  Thigh:  Non-tender and no defects  Knee has swelling 1+ effusion.                        Joint tenderness is present                        Patient is tender over the medial joint line  Lower Leg:  Has normal appearance and no tenderness or defects  Ankle:  Non-tender and no defects  Foot:  Non-tender and no defects Range of Motion:  Knee:  Range of motion is: 0-105 left, 0 to 110 right                        Crepitus is  present  Ankle:  Range of motion is normal. Strength and Tone:  The bilateral lower extremity  has normal strength and tone. Stability:  Knee:  The knee is stable.  Ankle:  The ankle is stable.   The patient has been educated about the nature of the problem(s) and counseled on treatment options.  The patient appeared to understand what I have discussed and is in agreement with it.  Encounter Diagnoses  Name Primary?  . Chronic pain of right knee Yes  . Chronic pain of left knee   . Cigarette nicotine dependence without complication     PLAN Call if any problems.  Precautions discussed.  Continue current medications.   Return to clinic 6 weeks   I have reviewed the [facility] web site prior to prescribing narcotic medicine for this patient.  She continues to smoke and has tried to cut back but has not been successful.  Electronically Signed Sanjuana Kava, MD 9/12/20189:10 AM

## 2017-05-30 ENCOUNTER — Telehealth: Payer: Self-pay | Admitting: *Deleted

## 2017-05-30 NOTE — Telephone Encounter (Signed)
ok 

## 2017-05-30 NOTE — Telephone Encounter (Signed)
Patient came in stating she wants a referral to a hand spealist. Please advise

## 2017-05-30 NOTE — Telephone Encounter (Signed)
Routing to Dr. Luna Glasgow to advise

## 2017-05-30 NOTE — Telephone Encounter (Signed)
Called patient, but no answer. Patient will need to get primary care doctor to refer her. We have no notes regardind a hand problem.

## 2017-06-11 NOTE — Progress Notes (Deleted)
Cardiology Office Note    Date:  06/11/2017   ID:  Lynn Logan, DOB Jan 09, 1971, MRN [id number]  PCP:  Barry Dienes, NP  Cardiologist:   Jenkins Rouge, MD   No chief complaint on file.   History of Present Illness:  Lynn Logan is a 46 y.o. female f/u for chest pain Initially seen June 2017  She is on disability for fibromyalgia She smokes 2-3 ppd.  She describes being admitted to [facility] 2016 for CHF. She is sedentary with poor Diet. On oxygen for COPD Chest pain atypical not related to exertion has had for months [employer] Can wake her from sleep not related to food. Norco helps.    Personally reviewed myovue 03/09/16 EF 65% no ischemia small basal lateral defect  Echo 03/09/16 reviewed: EF 55-60% mild LAE grade 2 diastolic dysfunction   ***  Past Medical History:  Diagnosis Date  . Anxiety   . Arthritis    Rheumatoid  . Asthma   . Breast cancer ([place])   . CHF (congestive heart failure) ([place])   . Chronic back pain   . Colon cancer ([place])   . COPD (chronic obstructive pulmonary disease) ([place])   . Depression   . Fibromyalgia   . Gout   . Heart attack ([place]) 2017  . Renal disorder   . Right lumbar radiculopathy   . Vitiligo    face    Past Surgical History:  Procedure Laterality Date  . CESAREAN SECTION    . DILITATION & CURRETTAGE/HYSTROSCOPY WITH NOVASURE ABLATION N/A 07/07/2015   Procedure: HYSTEROSCOPY, UTERINE CURETTAGE, ENDOMETRIAL  ABLATION Uterine Cavity Length=6.5cm Uterine Cavity Width=4.5cm Power=161 Watts Time=1 minute 19 seconds;  Surgeon: Florian Buff, MD;  Location: [facility];  Service: Gynecology;  Laterality: N/A;  . POLYPECTOMY  07/07/2015   Procedure: POLYPECTOMY;  Surgeon: Florian Buff, MD;  Location: [facility];  Service: Gynecology;;  . SALPINGOOPHORECTOMY Bilateral 10/13/2015   Procedure: BILATERAL SALPINGO OOPHORECTOMY;  Surgeon: Florian Buff, MD;  Location: [facility];  Service: Gynecology;  Laterality: Bilateral;  . TUBAL LIGATION    . VAGINAL  HYSTERECTOMY N/A 10/13/2015   Procedure: HYSTERECTOMY VAGINAL;  Surgeon: Florian Buff, MD;  Location: [facility];  Service: Gynecology;  Laterality: N/A;    Current Medications: Outpatient Medications Prior to Visit  Medication Sig Dispense Refill  . albuterol (PROVENTIL HFA;VENTOLIN HFA) 108 (90 Base) MCG/ACT inhaler Inhale 2 puffs into the lungs every 6 (six) hours as needed.    Marland Kitchen albuterol (PROVENTIL) (2.5 MG/3ML) 0.083% nebulizer solution Take 3 mLs (2.5 mg total) by nebulization every 6 (six) hours as needed for wheezing or shortness of breath. 150 mL 1  . budesonide-formoterol (SYMBICORT) 160-4.5 MCG/ACT inhaler Inhale 2 puffs into the lungs 2 (two) times daily.    . citalopram (CELEXA) 20 MG tablet Take 1 tablet by mouth daily.    . clonazePAM (KLONOPIN) 0.5 MG tablet Take 1 tablet by mouth 3 (three) times daily.    . cyclobenzaprine (FLEXERIL) 10 MG tablet Take 1 tablet by mouth at bedtime.    . diclofenac sodium (VOLTAREN) 1 % GEL Apply 2 g topically 4 (four) times daily. (Patient not taking: Reported on 02/28/2017) 100 g 2  . DULoxetine (CYMBALTA) 60 MG capsule TAKE ONE CAPSULE BY MOUTH ONCE DAILY 30 capsule 5  . estradiol (ESTRACE) 2 MG tablet Take 1 tablet (2 mg total) by mouth daily. 30 tablet 11  . ferrous sulfate 324 (65 Fe) MG TBEC Take 1 tablet (  325 mg total) by mouth daily. 30 tablet 3  . HYDROcodone-acetaminophen (NORCO/VICODIN) 5-325 MG tablet Take 1 tablet by mouth every 6 (six) hours as needed for moderate pain (Must last 30 days.Do not take and drive a car or use machinery.). 30 tablet 0  . hydrOXYzine (ATARAX/VISTARIL) 25 MG tablet Take 1 tablet by mouth at bedtime.    . megestrol (MEGACE) 40 MG tablet Take 1 tablet (40 mg total) by mouth daily. 90 tablet 3  . naproxen (NAPROSYN) 500 MG tablet Take 1 tablet (500 mg total) by mouth 2 (two) times daily with a meal. 60 tablet 5  . piroxicam (FELDENE) 20 MG capsule Take 1 capsule (20 mg total) by mouth daily. 30 capsule 2  .  polyethylene glycol powder (GLYCOLAX/MIRALAX) powder Take 17 g by mouth 2 (two) times daily as needed. 3350 g 1  . potassium chloride SA (K-DUR,KLOR-CON) 20 MEQ tablet Take 2 tablets (40 mEq total) by mouth 2 (two) times daily. 28 tablet 0  . pregabalin (LYRICA) 300 MG capsule Take 1 capsule (300 mg total) by mouth 2 (two) times daily. 60 capsule 1  . promethazine (PHENERGAN) 25 MG tablet Take 1 tablet (25 mg total) by mouth every 6 (six) hours as needed. 12 tablet 1  . RaNITidine HCl (ZANTAC PO) Take 75 mg by mouth daily as needed (acid reflux).     Marland Kitchen rOPINIRole (REQUIP) 1 MG tablet Take 1 tablet by mouth at bedtime.    . traZODone (DESYREL) 150 MG tablet Take 1 tablet by mouth at bedtime.  2   No facility-administered medications prior to visit.      Allergies:   Toradol [ketorolac tromethamine]; Buspirone; Lyrica [pregabalin]; Gabapentin; Tramadol; Zofran [ondansetron hcl]; and Penicillins   Social History   Social History  . Marital status: Divorced    Spouse name: N/A  . Number of children: N/A  . Years of education: N/A   Social History Main Topics  . Smoking status: Heavy Tobacco Smoker    Packs/day: 1.00    Years: 30.00    Types: Cigarettes  . Smokeless tobacco: Former Systems developer  . Alcohol use No  . Drug use: No     Comment: Hx of marijuana use - None now  . Sexual activity: Not Currently    Birth control/ protection: Surgical   Other Topics Concern  . Not on file   Social History Narrative  . No narrative on file     Family History:  The patient's family history with cancer in parents mom currently alive with intestinal  Cancer dad died of colon cancer. Has handicapped son 66 living with her Domingo Mend' is in jail a lot Home situation seems bleek Occasional ETOH no drugs    ROS:   Please see the history of present illness.    ROS All other systems reviewed and are negative.   PHYSICAL EXAM:   LMP 09/12/2015 (Exact Date)   Wt Readings from Last 3 Encounters:    05/23/17 175 lb (79.4 kg)  04/05/17 182 lb (82.6 kg)  03/14/17 180 lb (81.6 kg)    Affect appropriate Healthy:  appears stated age 17: normal Neck supple with no adenopathy JVP normal no bruits no thyromegaly Lungs clear with no wheezing and good diaphragmatic motion Heart:  S1/S2 no murmur, no rub, gallop or click PMI normal Abdomen: benighn, BS positve, no tenderness, no AAA no bruit.  No HSM or HJR Distal pulses intact with no bruits No edema Neuro non-focal Skin warm and  dry No muscular weakness    Studies/Labs Reviewed:   EKG:  02/15/16  SR rate 57 normal ECG   Recent Labs: 03/07/2017: ALT 14; B Natriuretic Peptide 148.0; BUN 9; Creatinine, Ser 0.80; Hemoglobin 13.0; Platelets 253; Potassium 3.7; Sodium 135   Lipid Panel    Component Value Date/Time   CHOL 197 12/31/2015 0949   TRIG 123 12/31/2015 0949   HDL 48 12/31/2015 0949   CHOLHDL 4.1 12/31/2015 0949   LDLCALC 124 (H) 12/31/2015 0949    Additional studies/ records that were reviewed today include:  Notes [facility] DR Dettinger ECG and CXR Myovue and echo June 2017     ASSESSMENT:    Chest Pain   PLAN:  In order of problems listed above:  1. Chest Pain atypical likely related to fibromyalgia non ischemic myovue June 2017 2. Chol: continue diet Rx 3. HTN Well controlled.  Continue current medications and low sodium Dash type diet.   4. COPD continue nebulizers and oxygen.  CXR reviewed 03/07/17 NAD  5. Fibromyalgia:  On cymbalta, naproxen and flexeril f/u primary     Medication Adjustments/Labs and Tests Ordered: Current medicines are reviewed at length with the patient today.  Concerns regarding medicines are outlined above.  Medication changes, Labs and Tests ordered today are listed in the Patient Instructions below. There are no Patient Instructions on file for this visit.   Signed, Jenkins Rouge, MD  06/11/2017 5:13 PM    [facility] [address], [place], [place]   [address] Phone: [phone]; Fax: [phone]

## 2017-06-18 ENCOUNTER — Ambulatory Visit: Payer: Medicare Other | Admitting: Cardiovascular Disease

## 2017-06-18 ENCOUNTER — Encounter: Payer: Self-pay | Admitting: Cardiovascular Disease

## 2017-06-21 ENCOUNTER — Telehealth: Payer: Self-pay | Admitting: Orthopaedic Surgery

## 2017-06-21 MED ORDER — HYDROCODONE-ACETAMINOPHEN 5-325 MG PO TABS: 1.0000 | ORAL_TABLET | Freq: Four times a day (QID) | ORAL | 0 refills | Status: DC | PRN
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
## 2017-07-04 ENCOUNTER — Encounter: Payer: Self-pay | Admitting: Orthopaedic Surgery

## 2017-07-04 ENCOUNTER — Ambulatory Visit: Payer: Medicare Other | Admitting: Orthopaedic Surgery

## 2017-07-11 ENCOUNTER — Encounter: Payer: Self-pay | Admitting: Orthopaedic Surgery

## 2017-07-11 ENCOUNTER — Ambulatory Visit (INDEPENDENT_AMBULATORY_CARE_PROVIDER_SITE_OTHER): Payer: Self-pay | Admitting: Orthopaedic Surgery

## 2017-07-11 VITALS — BP 108/72 | HR 68 | Ht 64.0 in | Wt 172.0 lb

## 2017-07-11 DIAGNOSIS — G8929 Other chronic pain: Secondary | ICD-10-CM

## 2017-07-11 DIAGNOSIS — M25561 Pain in right knee: Secondary | ICD-10-CM

## 2017-07-11 NOTE — Patient Instructions (Signed)

## 2017-07-11 NOTE — Progress Notes (Signed)
CC:  I have pain of my right knee. I would like an injection.  The patient has chronic pain of the right knee.  There is no recent trauma.  There is no redness.  Injections in the past have helped.  The knee has no redness, has an effusion and crepitus present.  ROM of the right knee is 0-110.  Impression:  Chronic knee pain right  Return: 6 weeks  PROCEDURE NOTE:  The patient requests injections of the right knee , verbal consent was obtained.  The right knee was prepped appropriately after time out was performed.   Sterile technique was observed and injection of 1 cc of Depo-Medrol 40 mg with several cc's of plain xylocaine. Anesthesia was provided by ethyl chloride and a 20-gauge needle was used to inject the knee area. The injection was tolerated well.  A band aid dressing was applied.  The patient was advised to apply ice later today and tomorrow to the injection sight as needed.  Electronically Signed Sanjuana Kava, MD 10/31/20188:25 AM

## 2017-07-19 ENCOUNTER — Telehealth: Payer: Self-pay | Admitting: Orthopedic Surgery

## 2017-07-19 ENCOUNTER — Other Ambulatory Visit: Payer: Self-pay | Admitting: Orthopaedic Surgery

## 2017-07-19 NOTE — Telephone Encounter (Signed)
Refill request on Hydrocodone/Acetaminophen 5-325  Mgs.   Qty  30  Sig: Take 1 tablet by mouth every 6 (six) hours as needed for moderate pain (Must last 30 days. Do not take and drive a car or use machinery.).

## 2017-07-23 MED ORDER — HYDROCODONE-ACETAMINOPHEN 5-325 MG PO TABS: 1.0000 | ORAL_TABLET | Freq: Four times a day (QID) | ORAL | 0 refills | Status: DC | PRN

## 2017-08-22 ENCOUNTER — Ambulatory Visit: Payer: Medicare Other | Admitting: Urology

## 2017-08-22 ENCOUNTER — Ambulatory Visit (INDEPENDENT_AMBULATORY_CARE_PROVIDER_SITE_OTHER): Payer: Medicare Other | Admitting: Orthopaedic Surgery

## 2017-08-22 ENCOUNTER — Encounter: Payer: Self-pay | Admitting: Orthopaedic Surgery

## 2017-08-22 DIAGNOSIS — G8929 Other chronic pain: Secondary | ICD-10-CM | POA: Diagnosis not present

## 2017-08-22 DIAGNOSIS — F17210 Nicotine dependence, cigarettes, uncomplicated: Secondary | ICD-10-CM

## 2017-08-22 DIAGNOSIS — M25561 Pain in right knee: Secondary | ICD-10-CM

## 2017-08-22 MED ORDER — HYDROCODONE-ACETAMINOPHEN 5-325 MG PO TABS: 1.0000 | ORAL_TABLET | Freq: Four times a day (QID) | ORAL | 0 refills | Status: DC | PRN

## 2017-08-22 NOTE — Progress Notes (Signed)
CC:  I have pain of my right knee. I would like an injection.  The patient has chronic pain of the right knee.  There is no recent trauma.  There is no redness.  Injections in the past have helped.  The knee has no redness, has an effusion and crepitus present.  ROM of the right knee is 0-110.  Impression:  Chronic knee pain right  Return: 6 weeks  PROCEDURE NOTE:  The patient requests injections of the right knee , verbal consent was obtained.  The right knee was prepped appropriately after time out was performed.   Sterile technique was observed and injection of 1 cc of Depo-Medrol 40 mg with several cc's of plain xylocaine. Anesthesia was provided by ethyl chloride and a 20-gauge needle was used to inject the knee area. The injection was tolerated well.  A band aid dressing was applied.  The patient was advised to apply ice later today and tomorrow to the injection sight as needed.  I have reviewed the [place] web site prior to prescribing narcotic medicine for this patient.  The patient was counseled about smoking and smoking cessation.  I spent about three to five minutes in doing this.  I, of course, determined the patient does smoke and frequency.  I have advised the patient of problems medically that can occur with continued smoking including poor wound and bone healing as well as the obvious respiratory problems.  I have assessed the patient's willingness to cut back and quit smoking.  The patient has stated she will stop.  She is down to only five cigarettes a day now and plans to try to stop by the end of this year.  I have told the patient to discuss with their family doctor also about smoking cessation.  I am willing to assist my patient in arranging this and to get additional help.I have suggested the patient have a set date to try to begin cutting back.  I have printed out a smoking cessation form about how to begin cutting back  and suggestions.  I will discuss this further when I see the patient in the future.  I have stressed that although this will be very difficult to accomplish, the benefits are very substantial and will give long term health benefits from now on.  Electronically Signed Sanjuana Kava, MD 12/12/20188:37 AM

## 2017-08-22 NOTE — Patient Instructions (Signed)
Steps to Quit Smoking Smoking tobacco can be bad for your health. It can also affect almost every organ in your body. Smoking puts you and people around you at risk for many serious long-lasting (chronic) diseases. Quitting smoking is hard, but it is one of the best things that you can do for your health. It is never too late to quit. What are the benefits of quitting smoking? When you quit smoking, you lower your risk for getting serious diseases and conditions. They can include:  Lung cancer or lung disease.  Heart disease.  Stroke.  Heart attack.  Not being able to have children (infertility).  Weak bones (osteoporosis) and broken bones (fractures).  If you have coughing, wheezing, and shortness of breath, those symptoms may get better when you quit. You may also get sick less often. If you are pregnant, quitting smoking can help to lower your chances of having a baby of low birth weight. What can I do to help me quit smoking? Talk with your doctor about what can help you quit smoking. Some things you can do (strategies) include:  Quitting smoking totally, instead of slowly cutting back how much you smoke over a period of time.  Going to in-person counseling. You are more likely to quit if you go to many counseling sessions.  Using resources and support systems, such as: ? Online chats with a counselor. ? Phone quitlines. ? Printed self-help materials. ? Support groups or group counseling. ? Text messaging programs. ? Mobile phone apps or applications.  Taking medicines. Some of these medicines may have nicotine in them. If you are pregnant or breastfeeding, do not take any medicines to quit smoking unless your doctor says it is okay. Talk with your doctor about counseling or other things that can help you.  Talk with your doctor about using more than one strategy at the same time, such as taking medicines while you are also going to in-person counseling. This can help make  quitting easier. What things can I do to make it easier to quit? Quitting smoking might feel very hard at first, but there is a lot that you can do to make it easier. Take these steps:  Talk to your family and friends. Ask them to support and encourage you.  Call phone quitlines, reach out to support groups, or work with a counselor.  Ask people who smoke to not smoke around you.  Avoid places that make you want (trigger) to smoke, such as: ? Bars. ? Parties. ? Smoke-break areas at work.  Spend time with people who do not smoke.  Lower the stress in your life. Stress can make you want to smoke. Try these things to help your stress: ? Getting regular exercise. ? Deep-breathing exercises. ? Yoga. ? Meditating. ? Doing a body scan. To do this, close your eyes, focus on one area of your body at a time from head to toe, and notice which parts of your body are tense. Try to relax the muscles in those areas.  Download or buy apps on your mobile phone or tablet that can help you stick to your quit plan. There are many free apps, such as QuitGuide from the CDC (Centers for Disease Control and Prevention). You can find more support from smokefree.gov and other websites.  This information is not intended to replace advice given to you by your health care provider. Make sure you discuss any questions you have with your health care provider. Document Released: 06/24/2009 Document   Revised: 04/25/2016 Document Reviewed: 01/12/2015 Elsevier Interactive Patient Education  2018 Elsevier Inc.  

## 2017-08-26 IMAGING — DX DG CHEST 2V
2 series · 2 of 2 positions shown · non-contrast
Comparison: Prior radiograph from 12/30/2015.

CLINICAL DATA: Initial evaluation for acute chest pain.

EXAM:
CHEST  2 VIEW

[chest pa]
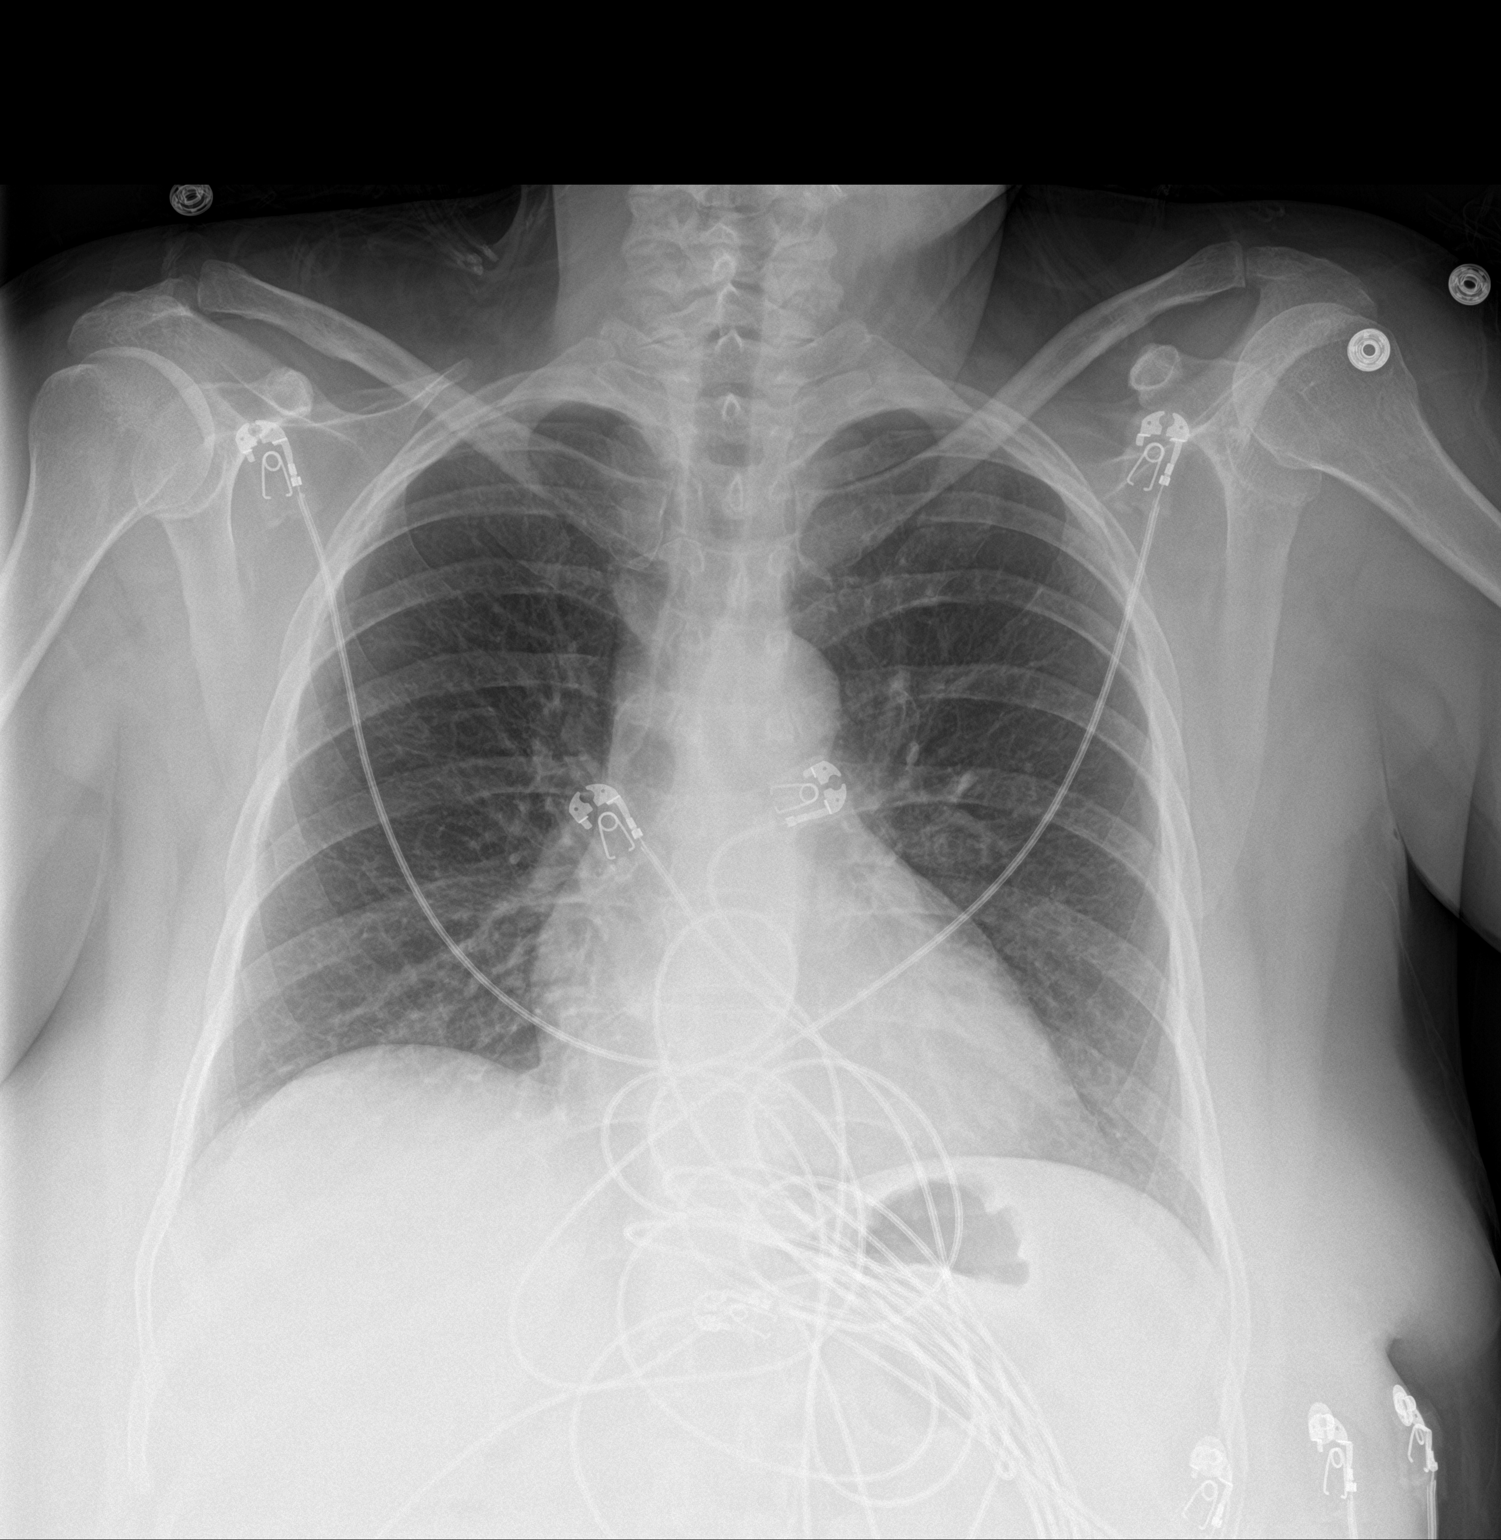

[chest lat]
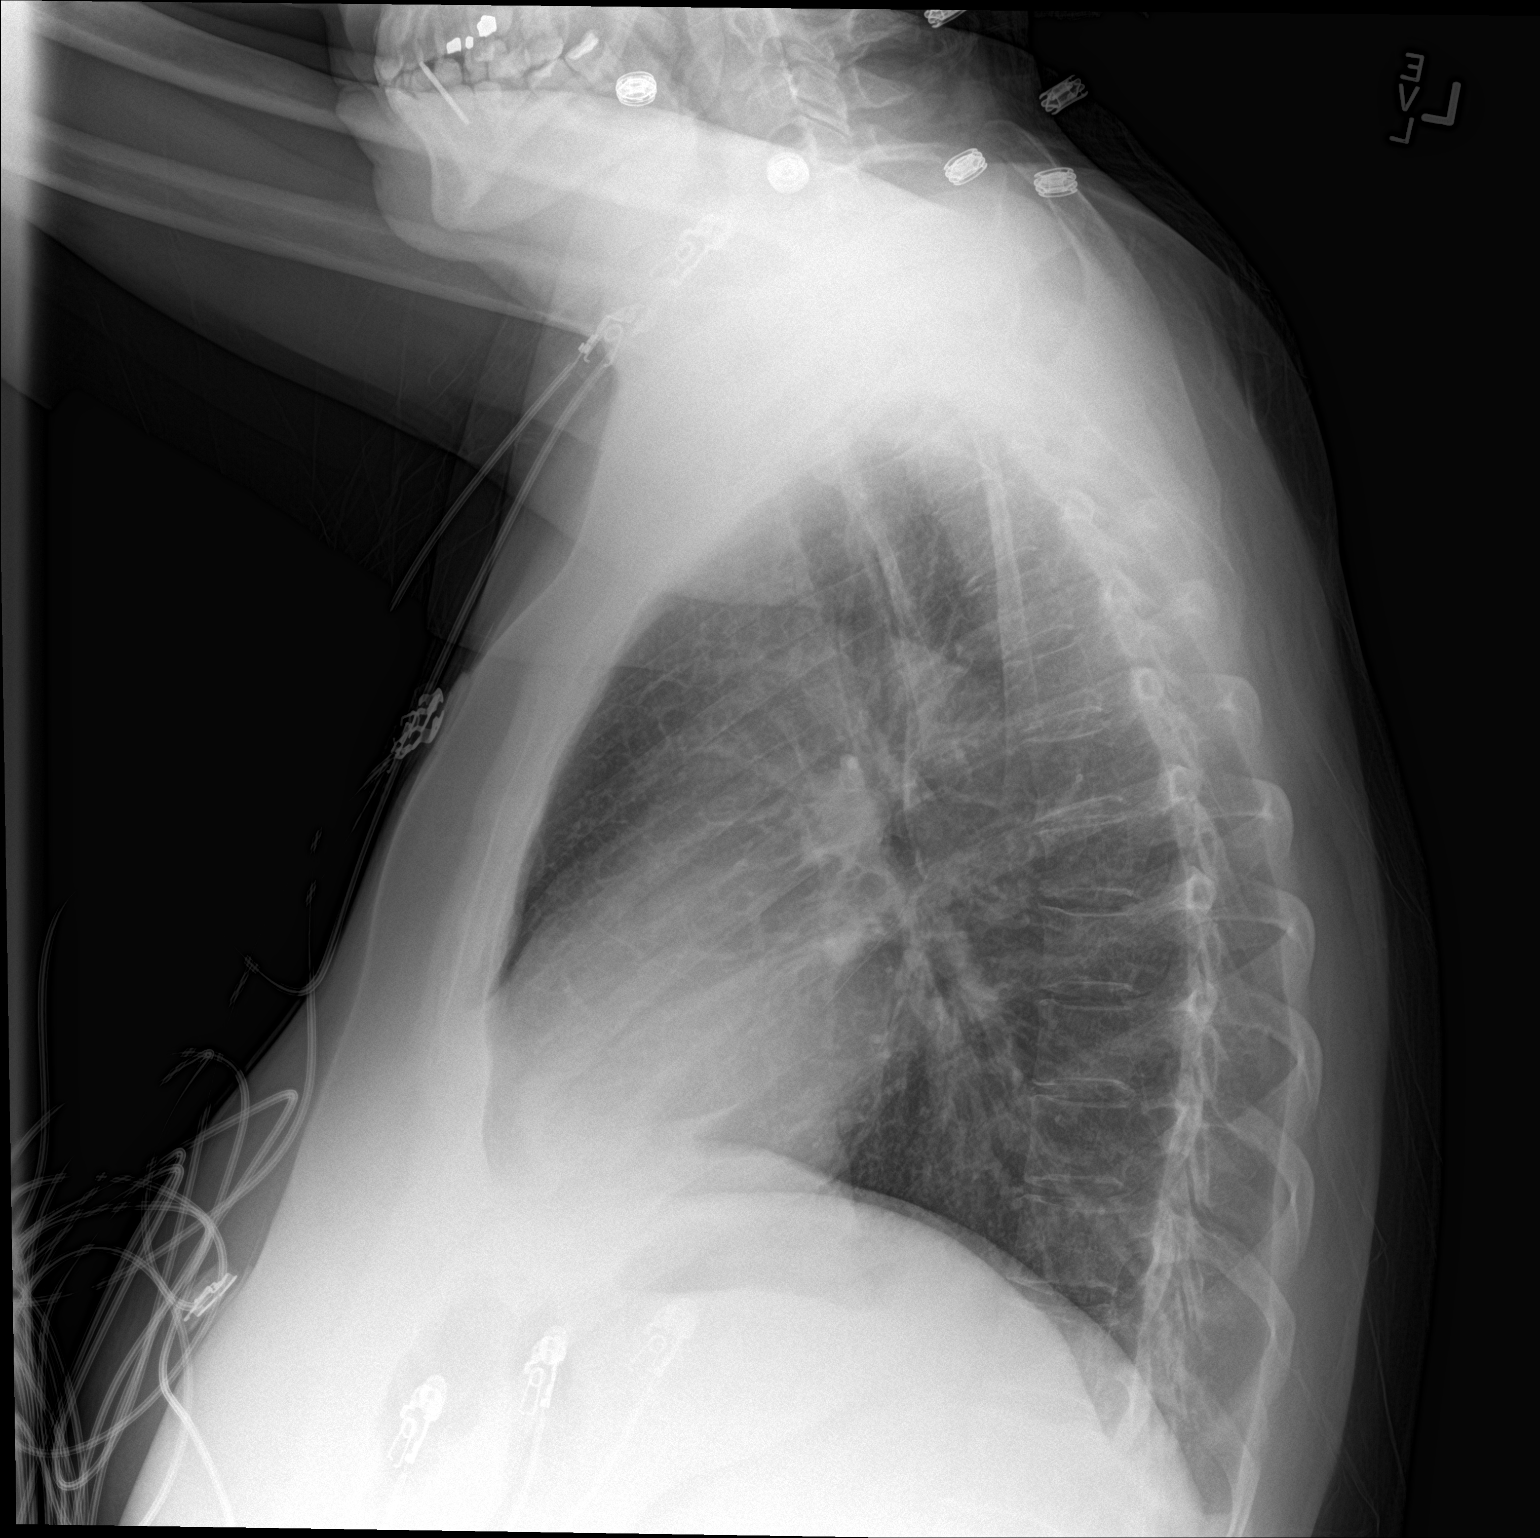

[2 of 2 positions shown; findings below may reference images not displayed]

FINDINGS: The cardiac and mediastinal silhouettes are stable in size and
contour, and remain within normal limits.

The lungs are normally inflated. No airspace consolidation, pleural
effusion, or pulmonary edema is identified. There is no
pneumothorax.

No acute osseous abnormality identified.
IMPRESSION: No active cardiopulmonary disease.

## 2017-08-27 IMAGING — CT CT ABD-PELV W/ CM
2 of 5 series · 16 of 46 positions shown, 18 images · IV contrast (iopamidol)
Comparison: Abdominal ultrasound dated 03/09/2016

CLINICAL DATA: 44-year-old female with vomiting. Evaluate for
obstruction.

EXAM:
CT ABDOMEN AND PELVIS WITH CONTRAST
TECHNIQUE: Multidetector CT imaging of the abdomen and pelvis was performed
using the standard protocol following bolus administration of
intravenous contrast.
CONTRAST:  100mL JLCI0P-FHH IOPAMIDOL (JLCI0P-FHH) INJECTION 61%

[Series 2: routine abd pel with · axial · 0.77mm/px · z∈[+320,+745]mm · 13 of 97 slices shown, 15 images]
[im 6/97  soft-tissue]
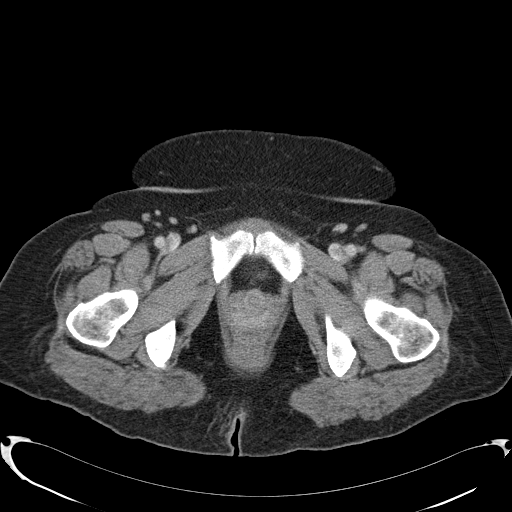
[im 6/97  bone]
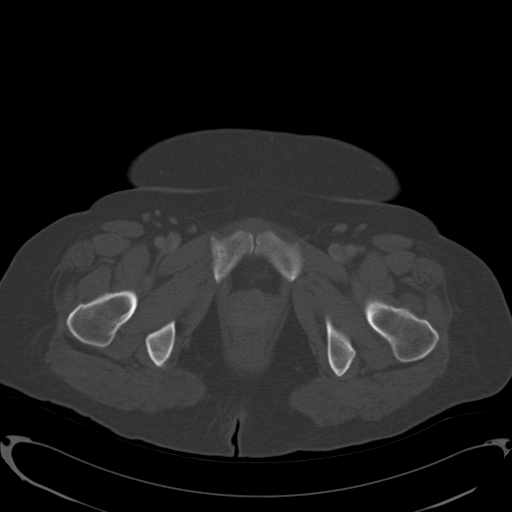
[im 12/97  soft-tissue]
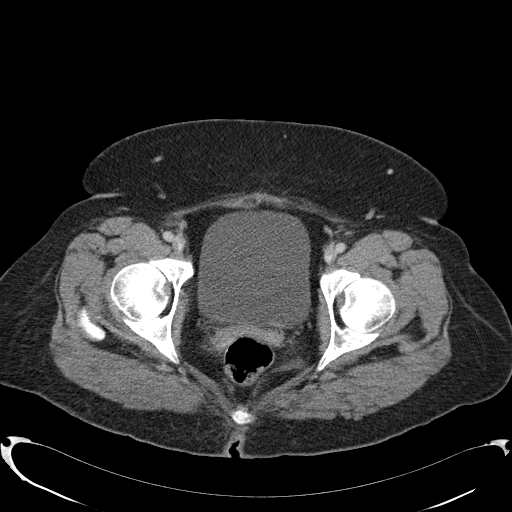
[im 23/97  soft-tissue]
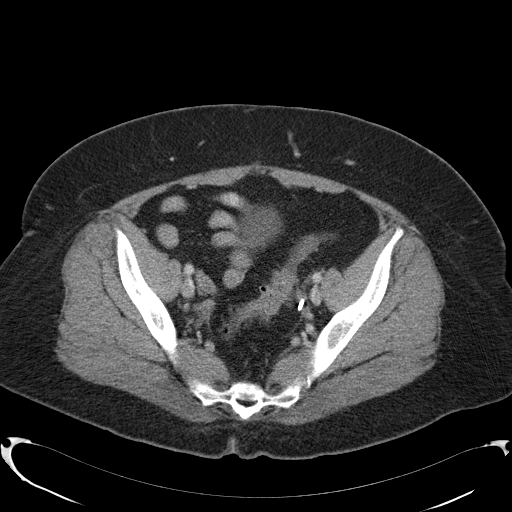
[im 29/97  soft-tissue]
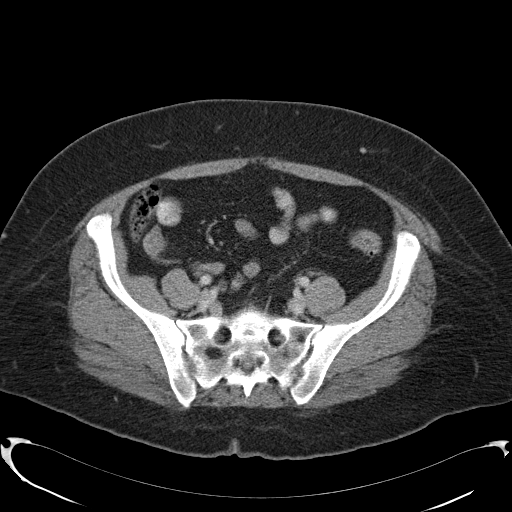
[im 34/97  soft-tissue]
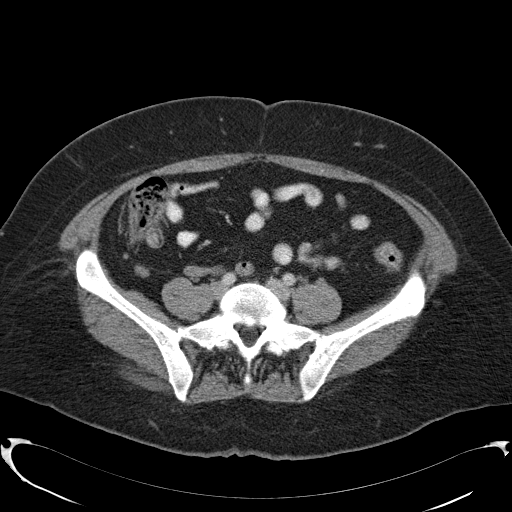
[im 40/97  soft-tissue]
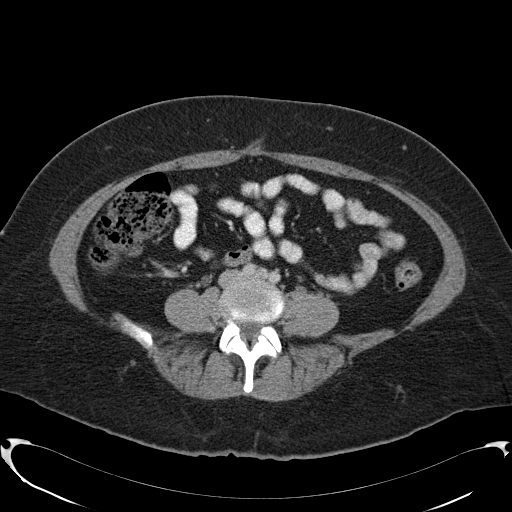
[im 51/97  soft-tissue]
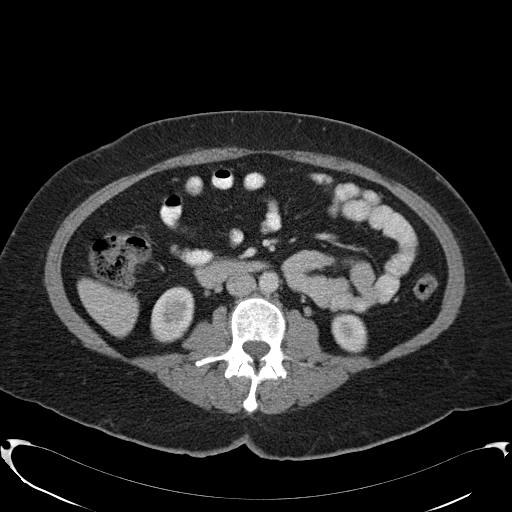
[im 57/97  soft-tissue]
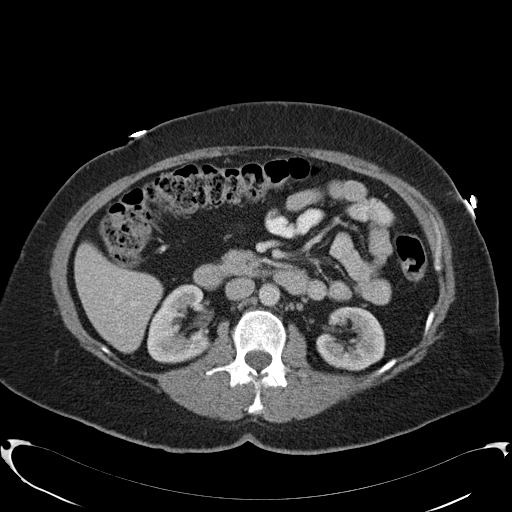
[im 63/97  soft-tissue]
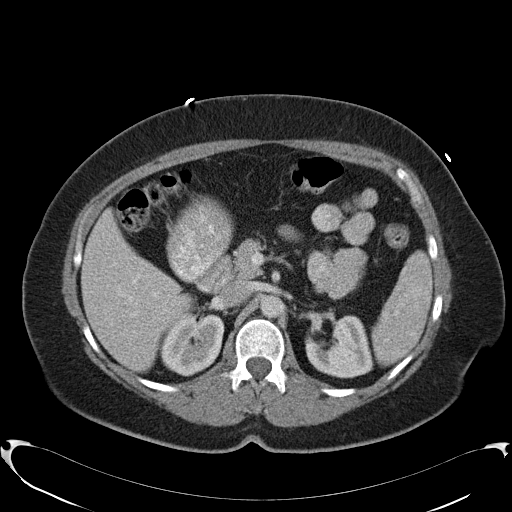
[im 63/97  bone]
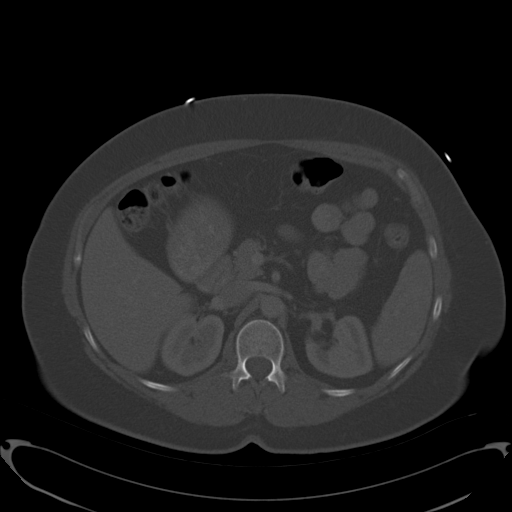
[im 68/97  soft-tissue]
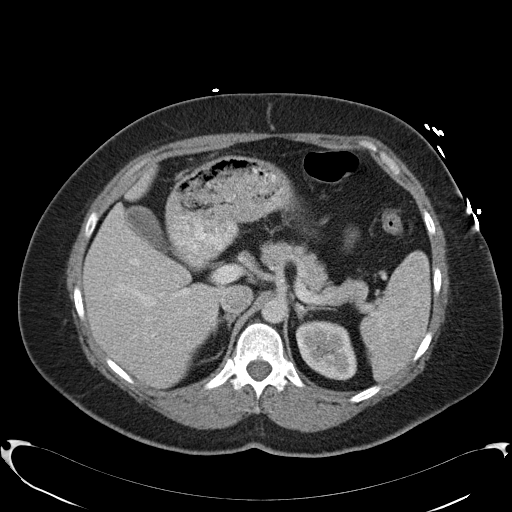
[im 74/97  soft-tissue]
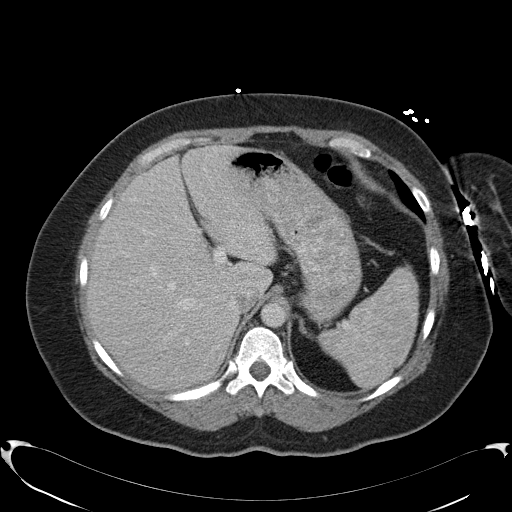
[im 85/97  soft-tissue]
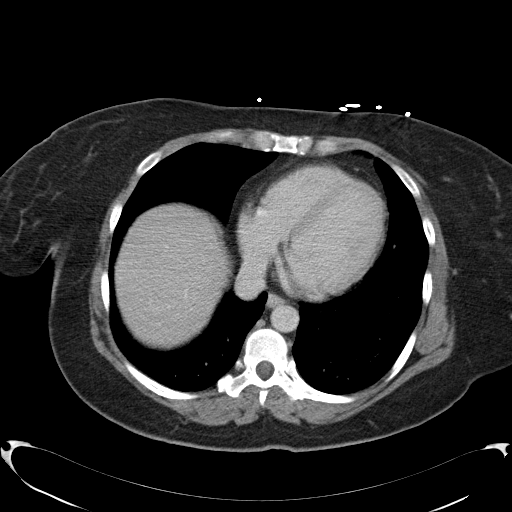
[im 91/97  soft-tissue]
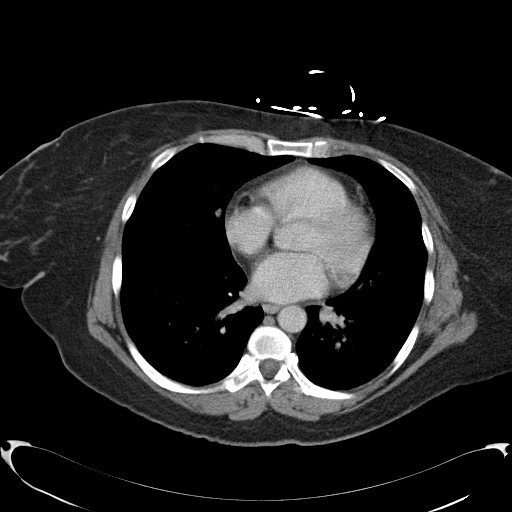

[Series 3: coronal · coronal · 0.80mm/px · 3 of 148 slices shown]
[im 50/148  soft-tissue]
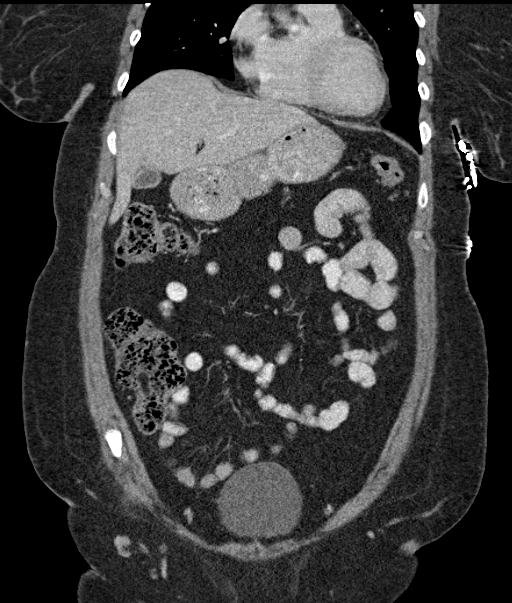
[im 66/148  soft-tissue]
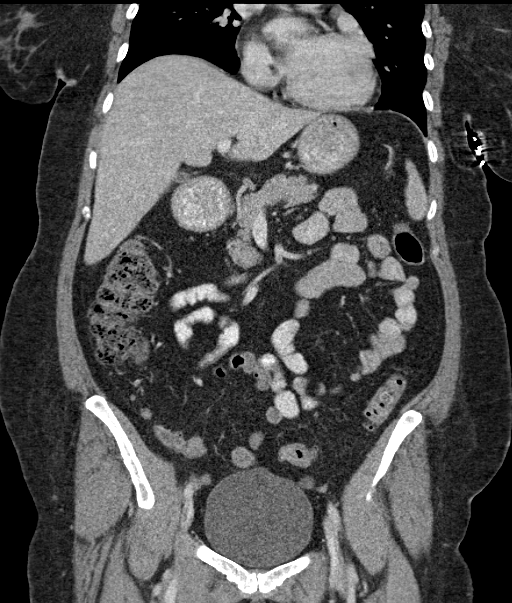
[im 82/148  soft-tissue]
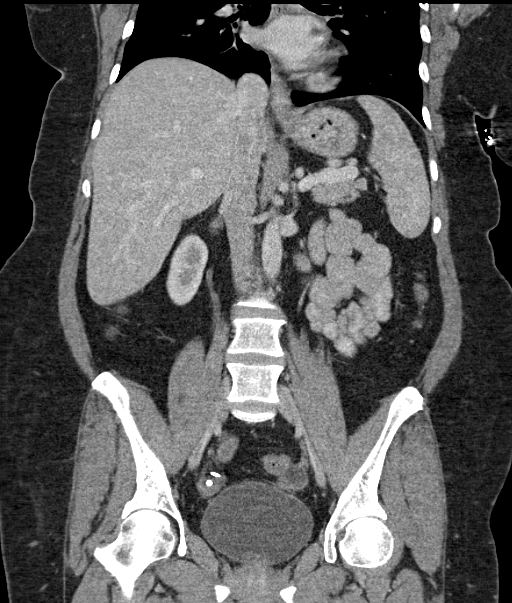

[16 of 46 positions shown; findings below may reference images not displayed]

FINDINGS: The visualized lung bases are clear. No intra-abdominal free air or
free fluid.

The liver, gallbladder, pancreas, spleen, adrenal glands, kidneys,
visualized ureters, and urinary bladder appear unremarkable.
Hysterectomy. The partially visualized 2.6 x 1.3 cm hypodense lesion
in the left vaginal wall is not well characterized but may represent
a Bartholin's gland cyst. Bilateral ovarian hypodensities may
represent normal ovarian tissue versus follicle/cyst. Multiple
surgical clips noted in the region of the ovaries. Ultrasound may
provide better evaluation of the pelvic structures if clinically
indicated.

There is sigmoid diverticulosis without active inflammatory changes.
There is no evidence of bowel obstruction or active inflammation.
Normal appendix.

The abdominal aorta and IVC appear unremarkable. No portal venous
gas identified. There is no adenopathy. The abdominal wall soft
tissues appear unremarkable. The osseous structures are intact.
IMPRESSION: No acute intra-abdominal pelvic pathology. No evidence of bowel
obstruction or active inflammation. Normal appendix.

Diverticulosis.

## 2017-09-20 ENCOUNTER — Telehealth: Payer: Self-pay | Admitting: Orthopaedic Surgery

## 2017-09-20 MED ORDER — HYDROCODONE-ACETAMINOPHEN 5-325 MG PO TABS: 1.0000 | ORAL_TABLET | Freq: Four times a day (QID) | ORAL | 0 refills | Status: DC | PRN

## 2017-09-20 NOTE — Telephone Encounter (Signed)
Patient requests refill on Hydrocodone/Acetaminophen 5-325 mgs  Qty  30  Sig: Take 1 tablet by mouth every 6 (six) hours as needed for moderate pain (Must last 30 days.Do not take and drive a car or use machinery.).  She uses [facility] in [place].

## 2017-10-03 ENCOUNTER — Ambulatory Visit: Payer: Medicare Other | Admitting: Orthopaedic Surgery

## 2017-10-17 ENCOUNTER — Ambulatory Visit: Payer: Medicare Other | Admitting: Urology

## 2017-10-23 ENCOUNTER — Ambulatory Visit (INDEPENDENT_AMBULATORY_CARE_PROVIDER_SITE_OTHER): Payer: Medicare Other | Admitting: Orthopaedic Surgery

## 2017-10-23 ENCOUNTER — Encounter: Payer: Self-pay | Admitting: Orthopaedic Surgery

## 2017-10-23 DIAGNOSIS — G8929 Other chronic pain: Secondary | ICD-10-CM | POA: Diagnosis not present

## 2017-10-23 DIAGNOSIS — M25561 Pain in right knee: Secondary | ICD-10-CM

## 2017-10-23 MED ORDER — HYDROCODONE-ACETAMINOPHEN 5-325 MG PO TABS: 1.0000 | ORAL_TABLET | Freq: Four times a day (QID) | ORAL | 0 refills | Status: DC | PRN

## 2017-10-23 NOTE — Progress Notes (Signed)
CC:  I have pain of my right knee. I would like an injection.  The patient has chronic pain of the right knee.  There is no recent trauma.  There is no redness.  Injections in the past have helped.  The knee has no redness, has an effusion and crepitus present.  ROM of the right knee is 0-110.  Impression:  Chronic knee pain right  Return: 3 months  PROCEDURE NOTE:  The patient requests injections of the right knee , verbal consent was obtained.  The right knee was prepped appropriately after time out was performed.   Sterile technique was observed and injection of 1 cc of Depo-Medrol 40 mg with several cc's of plain xylocaine. Anesthesia was provided by ethyl chloride and a 20-gauge needle was used to inject the knee area. The injection was tolerated well.  A band aid dressing was applied.  The patient was advised to apply ice later today and tomorrow to the injection sight as needed.  I have reviewed the [place] web site prior to prescribing narcotic medicine for this patient.  Electronically Signed Sanjuana Kava, MD 2/12/20199:31 AM

## 2017-10-23 NOTE — Patient Instructions (Signed)
Steps to Quit Smoking Smoking tobacco can be bad for your health. It can also affect almost every organ in your body. Smoking puts you and people around you at risk for many serious long-lasting (chronic) diseases. Quitting smoking is hard, but it is one of the best things that you can do for your health. It is never too late to quit. What are the benefits of quitting smoking? When you quit smoking, you lower your risk for getting serious diseases and conditions. They can include:  Lung cancer or lung disease.  Heart disease.  Stroke.  Heart attack.  Not being able to have children (infertility).  Weak bones (osteoporosis) and broken bones (fractures).  If you have coughing, wheezing, and shortness of breath, those symptoms may get better when you quit. You may also get sick less often. If you are pregnant, quitting smoking can help to lower your chances of having a baby of low birth weight. What can I do to help me quit smoking? Talk with your doctor about what can help you quit smoking. Some things you can do (strategies) include:  Quitting smoking totally, instead of slowly cutting back how much you smoke over a period of time.  Going to in-person counseling. You are more likely to quit if you go to many counseling sessions.  Using resources and support systems, such as: ? Online chats with a counselor. ? Phone quitlines. ? Printed self-help materials. ? Support groups or group counseling. ? Text messaging programs. ? Mobile phone apps or applications.  Taking medicines. Some of these medicines may have nicotine in them. If you are pregnant or breastfeeding, do not take any medicines to quit smoking unless your doctor says it is okay. Talk with your doctor about counseling or other things that can help you.  Talk with your doctor about using more than one strategy at the same time, such as taking medicines while you are also going to in-person counseling. This can help make  quitting easier. What things can I do to make it easier to quit? Quitting smoking might feel very hard at first, but there is a lot that you can do to make it easier. Take these steps:  Talk to your family and friends. Ask them to support and encourage you.  Call phone quitlines, reach out to support groups, or work with a counselor.  Ask people who smoke to not smoke around you.  Avoid places that make you want (trigger) to smoke, such as: ? Bars. ? Parties. ? Smoke-break areas at work.  Spend time with people who do not smoke.  Lower the stress in your life. Stress can make you want to smoke. Try these things to help your stress: ? Getting regular exercise. ? Deep-breathing exercises. ? Yoga. ? Meditating. ? Doing a body scan. To do this, close your eyes, focus on one area of your body at a time from head to toe, and notice which parts of your body are tense. Try to relax the muscles in those areas.  Download or buy apps on your mobile phone or tablet that can help you stick to your quit plan. There are many free apps, such as QuitGuide from the CDC (Centers for Disease Control and Prevention). You can find more support from smokefree.gov and other websites.  This information is not intended to replace advice given to you by your health care provider. Make sure you discuss any questions you have with your health care provider. Document Released: 06/24/2009 Document   Revised: 04/25/2016 Document Reviewed: 01/12/2015 Elsevier Interactive Patient Education  2018 Elsevier Inc.  

## 2017-11-18 ENCOUNTER — Other Ambulatory Visit: Payer: Self-pay | Admitting: Orthopaedic Surgery

## 2017-11-19 MED ORDER — HYDROCODONE-ACETAMINOPHEN 5-325 MG PO TABS: 1.0000 | ORAL_TABLET | Freq: Four times a day (QID) | ORAL | 0 refills | Status: DC | PRN

## 2017-12-19 ENCOUNTER — Other Ambulatory Visit: Payer: Self-pay | Admitting: Orthopedic Surgery

## 2017-12-19 MED ORDER — HYDROCODONE-ACETAMINOPHEN 5-325 MG PO TABS: 1.0000 | ORAL_TABLET | Freq: Four times a day (QID) | ORAL | 0 refills | Status: DC | PRN

## 2018-01-22 ENCOUNTER — Encounter: Payer: Self-pay | Admitting: Orthopaedic Surgery

## 2018-01-22 ENCOUNTER — Ambulatory Visit (INDEPENDENT_AMBULATORY_CARE_PROVIDER_SITE_OTHER): Payer: Medicare Other | Admitting: Orthopaedic Surgery

## 2018-01-22 VITALS — BP 102/72 | HR 67 | Ht 64.0 in | Wt 172.0 lb

## 2018-01-22 DIAGNOSIS — F17210 Nicotine dependence, cigarettes, uncomplicated: Secondary | ICD-10-CM

## 2018-01-22 DIAGNOSIS — M25561 Pain in right knee: Secondary | ICD-10-CM

## 2018-01-22 DIAGNOSIS — G8929 Other chronic pain: Secondary | ICD-10-CM | POA: Diagnosis not present

## 2018-01-22 MED ORDER — HYDROCODONE-ACETAMINOPHEN 5-325 MG PO TABS: 1.0000 | ORAL_TABLET | Freq: Four times a day (QID) | ORAL | 0 refills | Status: DC | PRN

## 2018-01-22 NOTE — Progress Notes (Signed)
Patient UK:Lynn Logan, female DOB:[birth year], 47 y.o. WCB:[id number]  Chief Complaint  Patient presents with  . Follow-up    Chronic pain right knee    HPI  Lynn Logan is a 47 y.o. female who has chronic right knee pain.  She has popping and swelling but no giving way.  She has no new trauma.  She has more pain as the day goes on.  She is taking her medicine.   HPI  Body mass index is 29.52 kg/m.  ROS  Review of Systems  Constitutional: Positive for fatigue.       She has no diabetes She has no hypertension She has Asthma She smokes   HENT: Negative for congestion.   Respiratory: Positive for shortness of breath and wheezing.   Cardiovascular: Negative for chest pain.  Endocrine: Positive for cold intolerance.  Musculoskeletal: Positive for arthralgias, back pain, gait problem, joint swelling and myalgias.  Allergic/Immunologic: Positive for environmental allergies.  Neurological: Negative for numbness.  Psychiatric/Behavioral: The patient is nervous/anxious.   All other systems reviewed and are negative.   Past Medical History:  Diagnosis Date  . Anxiety   . Arthritis    Rheumatoid  . Asthma   . Breast cancer ([place])   . CHF (congestive heart failure) ([place])   . Chronic back pain   . Colon cancer ([place])   . COPD (chronic obstructive pulmonary disease) ([place])   . Depression   . Fibromyalgia   . Gout   . Heart attack ([place]) 2017  . Renal disorder   . Right lumbar radiculopathy   . Vitiligo    face    Past Surgical History:  Procedure Laterality Date  . CESAREAN SECTION    . DILITATION & CURRETTAGE/HYSTROSCOPY WITH NOVASURE ABLATION N/A 07/07/2015   Procedure: HYSTEROSCOPY, UTERINE CURETTAGE, ENDOMETRIAL  ABLATION Uterine Cavity Length=6.5cm Uterine Cavity Width=4.5cm Power=161 Watts Time=1 minute 19 seconds;  Surgeon: Florian Buff, MD;  Location: [facility];  Service: Gynecology;  Laterality: N/A;  . POLYPECTOMY  07/07/2015   Procedure: POLYPECTOMY;   Surgeon: Florian Buff, MD;  Location: [facility];  Service: Gynecology;;  . SALPINGOOPHORECTOMY Bilateral 10/13/2015   Procedure: BILATERAL SALPINGO OOPHORECTOMY;  Surgeon: Florian Buff, MD;  Location: [facility];  Service: Gynecology;  Laterality: Bilateral;  . TUBAL LIGATION    . VAGINAL HYSTERECTOMY N/A 10/13/2015   Procedure: HYSTERECTOMY VAGINAL;  Surgeon: Florian Buff, MD;  Location: [facility];  Service: Gynecology;  Laterality: N/A;    Family History  Problem Relation Age of Onset  . Diabetes Mother   . Congestive Heart Failure Mother   . Depression Father   . Hypertension Father   . Cancer Father   . Heart disease Maternal Uncle     Social History Social History   Tobacco Use  . Smoking status: Heavy Tobacco Smoker    Packs/day: 1.00    Years: 30.00    Pack years: 30.00    Types: Cigarettes  . Smokeless tobacco: Former Network engineer Use Topics  . Alcohol use: No    Alcohol/week: 0.0 oz  . Drug use: No    Comment: Hx of marijuana use - None now    Allergies  Allergen Reactions  . Toradol [Ketorolac Tromethamine] Itching  . Buspirone Nausea And Vomiting  . Lyrica [Pregabalin] Nausea And Vomiting  . Gabapentin Nausea And Vomiting  . Tramadol Nausea And Vomiting  . Zofran [Ondansetron Hcl] Nausea And Vomiting  . Penicillins Rash    Has  patient had a PCN reaction causing immediate rash, facial/tongue/throat swelling, SOB or lightheadedness with hypotension: No Has patient had a PCN reaction causing severe rash involving mucus membranes or skin necrosis: No Has patient had a PCN reaction that required hospitalization No Has patient had a PCN reaction occurring within the last 10 years: yes If all of the above answers are "NO", then may proceed with Cephalosporin use.     Current Outpatient Medications  Medication Sig Dispense Refill  . albuterol (PROVENTIL HFA;VENTOLIN HFA) 108 (90 Base) MCG/ACT inhaler Inhale 2 puffs into the lungs every 6 (six) hours as needed.    Marland Kitchen  albuterol (PROVENTIL) (2.5 MG/3ML) 0.083% nebulizer solution Take 3 mLs (2.5 mg total) by nebulization every 6 (six) hours as needed for wheezing or shortness of breath. 150 mL 1  . budesonide-formoterol (SYMBICORT) 160-4.5 MCG/ACT inhaler Inhale 2 puffs into the lungs 2 (two) times daily.    . citalopram (CELEXA) 20 MG tablet Take 1 tablet by mouth daily.    . clonazePAM (KLONOPIN) 0.5 MG tablet Take 1 tablet by mouth 3 (three) times daily.    . cyclobenzaprine (FLEXERIL) 10 MG tablet Take 1 tablet by mouth at bedtime.    . DULoxetine (CYMBALTA) 60 MG capsule TAKE ONE CAPSULE BY MOUTH ONCE DAILY 30 capsule 5  . estradiol (ESTRACE) 2 MG tablet Take 1 tablet (2 mg total) by mouth daily. 30 tablet 11  . ferrous sulfate 324 (65 Fe) MG TBEC Take 1 tablet (325 mg total) by mouth daily. 30 tablet 3  . HYDROcodone-acetaminophen (NORCO/VICODIN) 5-325 MG tablet Take 1 tablet by mouth every 6 (six) hours as needed for moderate pain (Must last 30 days). 30 tablet 0  . hydrOXYzine (ATARAX/VISTARIL) 25 MG tablet Take 1 tablet by mouth at bedtime.    . megestrol (MEGACE) 40 MG tablet Take 1 tablet (40 mg total) by mouth daily. 90 tablet 3  . naproxen (NAPROSYN) 500 MG tablet TAKE ONE TABLET BY MOUTH TWICE DAILY. TAKE WITH FOOD. 60 tablet 5  . polyethylene glycol powder (GLYCOLAX/MIRALAX) powder Take 17 g by mouth 2 (two) times daily as needed. 3350 g 1  . potassium chloride SA (K-DUR,KLOR-CON) 20 MEQ tablet Take 2 tablets (40 mEq total) by mouth 2 (two) times daily. 28 tablet 0  . pregabalin (LYRICA) 300 MG capsule Take 1 capsule (300 mg total) by mouth 2 (two) times daily. 60 capsule 1  . promethazine (PHENERGAN) 25 MG tablet Take 1 tablet (25 mg total) by mouth every 6 (six) hours as needed. 12 tablet 1  . RaNITidine HCl (ZANTAC PO) Take 75 mg by mouth daily as needed (acid reflux).     Marland Kitchen rOPINIRole (REQUIP) 1 MG tablet Take 1 tablet by mouth at bedtime.    . traZODone (DESYREL) 150 MG tablet Take 1 tablet by  mouth at bedtime.  2   No current facility-administered medications for this visit.      Physical Exam  Blood pressure 102/72, pulse 67, height 5\' 4"  (1.626 m), weight 172 lb (78 kg), last menstrual period 09/12/2015.  Constitutional: overall normal hygiene, normal nutrition, well developed, normal grooming, normal body habitus. Assistive device:none  Musculoskeletal: gait and station Limp right, muscle tone and strength are normal, no tremors or atrophy is present.  .  Neurological: coordination overall normal.  Deep tendon reflex/nerve stretch intact.  Sensation normal.  Cranial nerves II-XII intact.   Skin:   Normal overall no scars, lesions, ulcers or rashes. No psoriasis.  Psychiatric: Alert  and oriented x 3.  Recent memory intact, remote memory unclear.  Normal mood and affect. Well groomed.  Good eye contact.  Cardiovascular: overall no swelling, no varicosities, no edema bilaterally, normal temperatures of the legs and arms, no clubbing, cyanosis and good capillary refill.  Lymphatic: palpation is normal.  The right lower extremity is examined:  Inspection:  Thigh:  Non-tender and no defects  Knee has swelling 1+ effusion.                        Joint tenderness is present                        Patient is tender over the medial joint line  Lower Leg:  Has normal appearance and no tenderness or defects  Ankle:  Non-tender and no defects  Foot:  Non-tender and no defects Range of Motion:  Knee:  Range of motion is: 0-105                        Crepitus is  present  Ankle:  Range of motion is normal. Strength and Tone:  The right lower extremity has normal strength and tone. Stability:  Knee:  The knee is stable.  Ankle:  The ankle is stable.   All other systems reviewed and are negative   The patient has been educated about the nature of the problem(s) and counseled on treatment options.  The patient appeared to understand what I have discussed and is in  agreement with it.  Encounter Diagnoses  Name Primary?  . Chronic pain of right knee Yes  . Cigarette nicotine dependence without complication     PLAN Call if any problems.  Precautions discussed.  Continue current medications.   Return to clinic 3 months   I have reviewed the [place] web site prior to prescribing narcotic medicine for this patient.  Electronically Signed Sanjuana Kava, MD 5/14/201911:19 AM

## 2018-02-18 ENCOUNTER — Other Ambulatory Visit: Payer: Self-pay | Admitting: Orthopaedic Surgery

## 2018-02-19 MED ORDER — HYDROCODONE-ACETAMINOPHEN 5-325 MG PO TABS: 1.0000 | ORAL_TABLET | Freq: Four times a day (QID) | ORAL | 0 refills | Status: DC | PRN

## 2018-03-22 ENCOUNTER — Other Ambulatory Visit: Payer: Self-pay | Admitting: Orthopaedic Surgery

## 2018-03-25 MED ORDER — HYDROCODONE-ACETAMINOPHEN 5-325 MG PO TABS: 1.0000 | ORAL_TABLET | Freq: Four times a day (QID) | ORAL | 0 refills | Status: DC | PRN

## 2018-04-24 ENCOUNTER — Encounter: Payer: Self-pay | Admitting: Orthopaedic Surgery

## 2018-04-24 ENCOUNTER — Ambulatory Visit (INDEPENDENT_AMBULATORY_CARE_PROVIDER_SITE_OTHER): Payer: Medicare Other | Admitting: Orthopaedic Surgery

## 2018-04-24 VITALS — BP 112/73 | HR 71 | Ht 62.0 in | Wt 126.0 lb

## 2018-04-24 DIAGNOSIS — M25562 Pain in left knee: Secondary | ICD-10-CM

## 2018-04-24 DIAGNOSIS — G8929 Other chronic pain: Secondary | ICD-10-CM | POA: Diagnosis not present

## 2018-04-24 DIAGNOSIS — F17210 Nicotine dependence, cigarettes, uncomplicated: Secondary | ICD-10-CM

## 2018-04-24 MED ORDER — NAPROXEN 500 MG PO TABS: ORAL_TABLET | ORAL | 5 refills | Status: DC

## 2018-04-24 MED ORDER — HYDROCODONE-ACETAMINOPHEN 5-325 MG PO TABS: 1.0000 | ORAL_TABLET | Freq: Four times a day (QID) | ORAL | 0 refills | Status: DC | PRN

## 2018-04-24 NOTE — Progress Notes (Signed)
CC:  I have pain of my left knee. I would like an injection.  The patient has chronic pain of the left knee.  There is no recent trauma.  There is no redness.  Injections in the past have helped.  The knee has no redness, has an effusion and crepitus present.  ROM of the left knee is 0-105.  Impression:  Chronic knee pain left  Return: 3 months  PROCEDURE NOTE:  The patient requests injections of the left knee, verbal consent was obtained.  The left knee was prepped appropriately after time out was performed.   Sterile technique was observed and injection of 1 cc of Depo-Medrol 40 mg with several cc's of plain xylocaine. Anesthesia was provided by ethyl chloride and a 20-gauge needle was used to inject the knee area. The injection was tolerated well.  A band aid dressing was applied.  The patient was advised to apply ice later today and tomorrow to the injection sight as needed.  I have reviewed the [place] web site prior to prescribing narcotic medicine for this patient.  The patient has read and signed an Opioid Treatment Agreement which has been scanned and added to the medical record.  The patient understands the agreement and agrees to abide with it.  The patient has chronic pain that is being treated with an opioid which relieves the pain.  The patient understands potential complications with chronic opioid treatment.  Electronically Signed Sanjuana Kava, MD 8/14/20191:47 PM

## 2018-05-23 ENCOUNTER — Other Ambulatory Visit: Payer: Self-pay

## 2018-05-27 ENCOUNTER — Other Ambulatory Visit: Payer: Self-pay

## 2018-05-27 MED ORDER — HYDROCODONE-ACETAMINOPHEN 5-325 MG PO TABS: 1.0000 | ORAL_TABLET | Freq: Four times a day (QID) | ORAL | 0 refills | Status: DC | PRN

## 2018-05-28 MED ORDER — HYDROCODONE-ACETAMINOPHEN 5-325 MG PO TABS: 1.0000 | ORAL_TABLET | Freq: Four times a day (QID) | ORAL | 0 refills | Status: DC | PRN

## 2018-06-23 ENCOUNTER — Other Ambulatory Visit: Payer: Self-pay

## 2018-06-25 MED ORDER — HYDROCODONE-ACETAMINOPHEN 5-325 MG PO TABS: 1.0000 | ORAL_TABLET | Freq: Four times a day (QID) | ORAL | 0 refills | Status: DC | PRN

## 2018-07-24 ENCOUNTER — Ambulatory Visit: Payer: Medicare Other | Admitting: Orthopaedic Surgery

## 2018-07-25 ENCOUNTER — Ambulatory Visit (INDEPENDENT_AMBULATORY_CARE_PROVIDER_SITE_OTHER): Payer: Medicare Other | Admitting: Orthopaedic Surgery

## 2018-07-25 ENCOUNTER — Encounter: Payer: Self-pay | Admitting: Orthopaedic Surgery

## 2018-07-25 VITALS — BP 101/74 | HR 77 | Ht 62.0 in | Wt 118.0 lb

## 2018-07-25 DIAGNOSIS — G8929 Other chronic pain: Secondary | ICD-10-CM

## 2018-07-25 DIAGNOSIS — F17210 Nicotine dependence, cigarettes, uncomplicated: Secondary | ICD-10-CM

## 2018-07-25 DIAGNOSIS — M25562 Pain in left knee: Secondary | ICD-10-CM | POA: Diagnosis not present

## 2018-07-25 MED ORDER — HYDROCODONE-ACETAMINOPHEN 5-325 MG PO TABS: 1.0000 | ORAL_TABLET | Freq: Four times a day (QID) | ORAL | 0 refills | Status: DC | PRN

## 2018-07-25 NOTE — Progress Notes (Signed)
CC:  I have pain of my left knee. I would like an injection.  The patient has chronic pain of the left knee.  There is no recent trauma.  There is no redness.  Injections in the past have helped.  The knee has no redness, has an effusion and crepitus present.  ROM of the left knee is 0-105.  Impression:  Chronic knee pain left  Return: 3 months  PROCEDURE NOTE:  The patient requests injections of the left knee, verbal consent was obtained.  The left knee was prepped appropriately after time out was performed.   Sterile technique was observed and injection of 1 cc of Depo-Medrol 40 mg with several cc's of plain xylocaine. Anesthesia was provided by ethyl chloride and a 20-gauge needle was used to inject the knee area. The injection was tolerated well.  A band aid dressing was applied.  The patient was advised to apply ice later today and tomorrow to the injection sight as needed.  I have reviewed the [place] web site prior to prescribing narcotic medicine for this patient.    Electronically Signed Sanjuana Kava, MD 11/14/20192:17 PM

## 2018-08-26 ENCOUNTER — Other Ambulatory Visit: Payer: Self-pay

## 2018-08-27 MED ORDER — HYDROCODONE-ACETAMINOPHEN 5-325 MG PO TABS: 1.0000 | ORAL_TABLET | Freq: Four times a day (QID) | ORAL | 0 refills | Status: DC | PRN

## 2018-09-23 ENCOUNTER — Other Ambulatory Visit: Payer: Self-pay

## 2018-09-24 MED ORDER — HYDROCODONE-ACETAMINOPHEN 5-325 MG PO TABS: 1.0000 | ORAL_TABLET | Freq: Four times a day (QID) | ORAL | 0 refills | Status: DC | PRN

## 2018-10-23 ENCOUNTER — Encounter: Payer: Self-pay | Admitting: Orthopaedic Surgery

## 2018-10-23 ENCOUNTER — Ambulatory Visit (INDEPENDENT_AMBULATORY_CARE_PROVIDER_SITE_OTHER): Payer: Medicare Other | Admitting: Orthopaedic Surgery

## 2018-10-23 VITALS — BP 115/79 | HR 79 | Ht 62.0 in | Wt 122.0 lb

## 2018-10-23 DIAGNOSIS — F17210 Nicotine dependence, cigarettes, uncomplicated: Secondary | ICD-10-CM

## 2018-10-23 DIAGNOSIS — G8929 Other chronic pain: Secondary | ICD-10-CM | POA: Diagnosis not present

## 2018-10-23 DIAGNOSIS — M25562 Pain in left knee: Secondary | ICD-10-CM

## 2018-10-23 MED ORDER — HYDROCODONE-ACETAMINOPHEN 5-325 MG PO TABS: 1.0000 | ORAL_TABLET | Freq: Four times a day (QID) | ORAL | 0 refills | Status: DC | PRN

## 2018-10-23 NOTE — Progress Notes (Signed)
Patient Lynn Logan, female DOB:[birth year], 48 y.o. MHD:[id number]  Chief Complaint  Patient presents with  . Knee Pain    left    HPI  Lynn Logan is a 48 y.o. female who has left knee pain. She has swelling, popping but no giving way.  She has had more pain recently with the cold rainy weather. She has no new trauma.  She does not want to consider surgery.   Body mass index is 22.31 kg/m.  ROS  Review of Systems  Constitutional: Positive for fatigue.       She has no diabetes She has no hypertension She has Asthma She smokes   HENT: Negative for congestion.   Respiratory: Positive for shortness of breath and wheezing.   Cardiovascular: Negative for chest pain.  Endocrine: Positive for cold intolerance.  Musculoskeletal: Positive for arthralgias, back pain, gait problem, joint swelling and myalgias.  Allergic/Immunologic: Positive for environmental allergies.  Neurological: Negative for numbness.  Psychiatric/Behavioral: The patient is nervous/anxious.   All other systems reviewed and are negative.   All other systems reviewed and are negative.  The following is a summary of the past history medically, past history surgically, known current medicines, social history and family history.  This information is gathered electronically by the computer from prior information and documentation.  I review this each visit and have found including this information at this point in the chart is beneficial and informative.    Past Medical History:  Diagnosis Date  . Anxiety   . Arthritis    Rheumatoid  . Asthma   . Breast cancer ([place])   . CHF (congestive heart failure) ([place])   . Chronic back pain   . Colon cancer ([place])   . COPD (chronic obstructive pulmonary disease) ([place])   . Depression   . Fibromyalgia   . Gout   . Heart attack ([place]) 2017  . Renal disorder   . Right lumbar radiculopathy   . Vitiligo    face    Past Surgical History:  Procedure  Laterality Date  . CESAREAN SECTION    . DILITATION & CURRETTAGE/HYSTROSCOPY WITH NOVASURE ABLATION N/A 07/07/2015   Procedure: HYSTEROSCOPY, UTERINE CURETTAGE, ENDOMETRIAL  ABLATION Uterine Cavity Length=6.5cm Uterine Cavity Width=4.5cm Power=161 Watts Time=1 minute 19 seconds;  Surgeon: Florian Buff, MD;  Location: [facility];  Service: Gynecology;  Laterality: N/A;  . POLYPECTOMY  07/07/2015   Procedure: POLYPECTOMY;  Surgeon: Florian Buff, MD;  Location: [facility];  Service: Gynecology;;  . SALPINGOOPHORECTOMY Bilateral 10/13/2015   Procedure: BILATERAL SALPINGO OOPHORECTOMY;  Surgeon: Florian Buff, MD;  Location: [facility];  Service: Gynecology;  Laterality: Bilateral;  . TUBAL LIGATION    . VAGINAL HYSTERECTOMY N/A 10/13/2015   Procedure: HYSTERECTOMY VAGINAL;  Surgeon: Florian Buff, MD;  Location: [facility];  Service: Gynecology;  Laterality: N/A;    Family History  Problem Relation Age of Onset  . Diabetes Mother   . Congestive Heart Failure Mother   . Depression Father   . Hypertension Father   . Cancer Father   . Heart disease Maternal Uncle     Social History Social History   Tobacco Use  . Smoking status: Heavy Tobacco Smoker    Packs/day: 1.00    Years: 30.00    Pack years: 30.00    Types: Cigarettes  . Smokeless tobacco: Former Network engineer Use Topics  . Alcohol use: No    Alcohol/week: 0.0 standard drinks  . Drug use: No  Comment: Hx of marijuana use - None now    Allergies  Allergen Reactions  . Toradol [Ketorolac Tromethamine] Itching  . Buspirone Nausea And Vomiting  . Lyrica [Pregabalin] Nausea And Vomiting  . Gabapentin Nausea And Vomiting  . Tramadol Nausea And Vomiting  . Zofran [Ondansetron Hcl] Nausea And Vomiting  . Penicillins Rash    Has patient had a PCN reaction causing immediate rash, facial/tongue/throat swelling, SOB or lightheadedness with hypotension: No Has patient had a PCN reaction causing severe rash involving mucus membranes or skin  necrosis: No Has patient had a PCN reaction that required hospitalization No Has patient had a PCN reaction occurring within the last 10 years: yes If all of the above answers are "NO", then may proceed with Cephalosporin use.     Current Outpatient Medications  Medication Sig Dispense Refill  . albuterol (PROVENTIL HFA;VENTOLIN HFA) 108 (90 Base) MCG/ACT inhaler Inhale 2 puffs into the lungs every 6 (six) hours as needed.    Marland Kitchen albuterol (PROVENTIL) (2.5 MG/3ML) 0.083% nebulizer solution Take 3 mLs (2.5 mg total) by nebulization every 6 (six) hours as needed for wheezing or shortness of breath. 150 mL 1  . budesonide-formoterol (SYMBICORT) 160-4.5 MCG/ACT inhaler Inhale 2 puffs into the lungs 2 (two) times daily.    . citalopram (CELEXA) 20 MG tablet Take 1 tablet by mouth daily.    . clonazePAM (KLONOPIN) 0.5 MG tablet Take 1 tablet by mouth 3 (three) times daily.    . cyclobenzaprine (FLEXERIL) 10 MG tablet Take 1 tablet by mouth at bedtime.    . DULoxetine (CYMBALTA) 60 MG capsule TAKE ONE CAPSULE BY MOUTH ONCE DAILY 30 capsule 5  . estradiol (ESTRACE) 2 MG tablet Take 1 tablet (2 mg total) by mouth daily. 30 tablet 11  . ferrous sulfate 324 (65 Fe) MG TBEC Take 1 tablet (325 mg total) by mouth daily. 30 tablet 3  . HYDROcodone-acetaminophen (NORCO/VICODIN) 5-325 MG tablet Take 1 tablet by mouth every 6 (six) hours as needed for moderate pain (Must last 30 days). 45 tablet 0  . hydrOXYzine (ATARAX/VISTARIL) 25 MG tablet Take 1 tablet by mouth at bedtime.    . megestrol (MEGACE) 40 MG tablet Take 1 tablet (40 mg total) by mouth daily. 90 tablet 3  . naproxen (NAPROSYN) 500 MG tablet TAKE ONE TABLET BY MOUTH TWICE DAILY. TAKE WITH FOOD. 60 tablet 5  . polyethylene glycol powder (GLYCOLAX/MIRALAX) powder Take 17 g by mouth 2 (two) times daily as needed. 3350 g 1  . potassium chloride SA (K-DUR,KLOR-CON) 20 MEQ tablet Take 2 tablets (40 mEq total) by mouth 2 (two) times daily. 28 tablet 0  .  pregabalin (LYRICA) 300 MG capsule Take 1 capsule (300 mg total) by mouth 2 (two) times daily. 60 capsule 1  . promethazine (PHENERGAN) 25 MG tablet Take 1 tablet (25 mg total) by mouth every 6 (six) hours as needed. 12 tablet 1  . RaNITidine HCl (ZANTAC PO) Take 75 mg by mouth daily as needed (acid reflux).     Marland Kitchen rOPINIRole (REQUIP) 1 MG tablet Take 1 tablet by mouth at bedtime.    . traZODone (DESYREL) 150 MG tablet Take 1 tablet by mouth at bedtime.  2   No current facility-administered medications for this visit.      Physical Exam  Blood pressure 115/79, pulse 79, height 5\' 2"  (1.575 m), weight 122 lb (55.3 kg), last menstrual period 09/12/2015.  Constitutional: overall normal hygiene, normal nutrition, well developed, normal grooming, normal  body habitus. Assistive device:none  Musculoskeletal: gait and station Limp left, muscle tone and strength are normal, no tremors or atrophy is present.  .  Neurological: coordination overall normal.  Deep tendon reflex/nerve stretch intact.  Sensation normal.  Cranial nerves II-XII intact.   Skin:   Normal overall no scars, lesions, ulcers or rashes. No psoriasis.  Psychiatric: Alert and oriented x 3.  Recent memory intact, remote memory unclear.  Normal mood and affect. Well groomed.  Good eye contact.  Cardiovascular: overall no swelling, no varicosities, no edema bilaterally, normal temperatures of the legs and arms, no clubbing, cyanosis and good capillary refill.  Lymphatic: palpation is normal.  The left lower extremity is examined:  Inspection:  Thigh:  Non-tender and no defects  Knee has swelling 1+ effusion.                        Joint tenderness is present                        Patient is tender over the medial joint line  Lower Leg:  Has normal appearance and no tenderness or defects  Ankle:  Non-tender and no defects  Foot:  Non-tender and no defects Range of Motion:  Knee:  Range of motion is: 0-110                         Crepitus is  present  Ankle:  Range of motion is normal. Strength and Tone:  The left lower extremity has normal strength and tone. Stability:  Knee:  The knee is stable.  Ankle:  The ankle is stable.   All other systems reviewed and are negative   The patient has been educated about the nature of the problem(s) and counseled on treatment options.  The patient appeared to understand what I have discussed and is in agreement with it.  Encounter Diagnoses  Name Primary?  . Chronic pain of left knee Yes  . Cigarette nicotine dependence without complication     PLAN Call if any problems.  Precautions discussed.  Continue current medications.   Return to clinic 3 months   I have reviewed the [place] web site prior to prescribing narcotic medicine for this patient.   Electronically Signed Sanjuana Kava, MD 2/12/20202:34 PM

## 2018-11-21 ENCOUNTER — Telehealth: Payer: Self-pay | Admitting: Orthopaedic Surgery

## 2018-11-21 NOTE — Telephone Encounter (Signed)
Hydrocodone-Acetaminophen 5/325 MG   Qty 45 Tablets  PATIENT USES [facility] IN [place]

## 2018-11-26 MED ORDER — HYDROCODONE-ACETAMINOPHEN 5-325 MG PO TABS: 1.0000 | ORAL_TABLET | Freq: Four times a day (QID) | ORAL | 0 refills | Status: DC | PRN
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
## 2018-12-06 ENCOUNTER — Encounter (HOSPITAL_COMMUNITY): Payer: Self-pay | Admitting: Psychiatry

## 2018-12-06 ENCOUNTER — Ambulatory Visit (INDEPENDENT_AMBULATORY_CARE_PROVIDER_SITE_OTHER): Payer: Medicare Other | Admitting: Psychiatry

## 2018-12-06 ENCOUNTER — Other Ambulatory Visit: Payer: Self-pay

## 2018-12-06 VITALS — BP 103/67 | HR 60 | Temp 98.1°F | Ht 62.0 in | Wt 125.0 lb

## 2018-12-06 DIAGNOSIS — F321 Major depressive disorder, single episode, moderate: Secondary | ICD-10-CM | POA: Diagnosis not present

## 2018-12-06 DIAGNOSIS — F4310 Post-traumatic stress disorder, unspecified: Secondary | ICD-10-CM

## 2018-12-06 MED ORDER — SERTRALINE HCL 50 MG PO TABS: ORAL_TABLET | ORAL | 0 refills | Status: DC

## 2018-12-06 NOTE — Patient Instructions (Signed)
1. Start sertraline 25 mg at night for one week, then 50 mg at night  2. Check TSH 3. Return to clinic in one month for 30 mins 4. Referral to thearpy

## 2018-12-07 LAB — TSH: TSH: 3.74 mIU/L

## 2018-12-24 ENCOUNTER — Telehealth: Payer: Self-pay | Admitting: Orthopaedic Surgery

## 2018-12-24 MED ORDER — HYDROCODONE-ACETAMINOPHEN 5-325 MG PO TABS: 1.0000 | ORAL_TABLET | Freq: Four times a day (QID) | ORAL | 0 refills | Status: DC | PRN

## 2018-12-24 NOTE — Telephone Encounter (Signed)
Hydrocodone-Acetaminophen  5/325 mg  Qty 45 Tablets  PATIENT USES [facility]
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
## 2019-01-03 ENCOUNTER — Telehealth: Payer: Self-pay | Admitting: Orthopaedic Surgery

## 2019-01-03 NOTE — Telephone Encounter (Signed)
Patient called to relay her knees are hurting quite a bit; aware of her upcoming appointment in May; asking if physical therapy may help.  Has last had therapy done in 2018.  If approved, she would like to have it at [facility]) if facility is open for visits. Patient's [phone]

## 2019-01-06 NOTE — Telephone Encounter (Signed)
If PT is open, send there.  If not, an inperson appt.

## 2019-01-07 ENCOUNTER — Ambulatory Visit (INDEPENDENT_AMBULATORY_CARE_PROVIDER_SITE_OTHER): Payer: Medicare Other | Admitting: Psychiatry

## 2019-01-07 ENCOUNTER — Encounter (HOSPITAL_COMMUNITY): Payer: Self-pay | Admitting: Psychiatry

## 2019-01-07 ENCOUNTER — Other Ambulatory Visit: Payer: Self-pay

## 2019-01-07 ENCOUNTER — Telehealth (HOSPITAL_COMMUNITY): Payer: Self-pay | Admitting: *Deleted

## 2019-01-07 DIAGNOSIS — F4310 Post-traumatic stress disorder, unspecified: Secondary | ICD-10-CM | POA: Diagnosis not present

## 2019-01-07 DIAGNOSIS — F331 Major depressive disorder, recurrent, moderate: Secondary | ICD-10-CM | POA: Diagnosis not present

## 2019-01-07 MED ORDER — SERTRALINE HCL 100 MG PO TABS: 100.0000 mg | ORAL_TABLET | Freq: Every day | ORAL | 1 refills | Status: DC

## 2019-01-07 NOTE — Patient Instructions (Addendum)
1. Increase sertraline 100 mg daily  2. Next appointment: 5/27 at 9:50 for 20 mins, video visit 4. Referral to IOP

## 2019-01-07 NOTE — Telephone Encounter (Signed)
[facility] PT, they are not doing any patient care at this time. Please book appointment.

## 2019-01-07 NOTE — Telephone Encounter (Signed)
She has an appointment this afternoon, so will check in with her later. Also make sure that she is seen by her PCP at least for her physical condition.

## 2019-01-07 NOTE — Telephone Encounter (Signed)
Routing to nurse per response. If appointment is needed, re-route note for appointment scheduling.

## 2019-01-07 NOTE — Telephone Encounter (Signed)
Patient was seen in the E.R. & given Discharge instructions
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
## 2019-01-08 ENCOUNTER — Telehealth (HOSPITAL_COMMUNITY): Payer: Self-pay | Admitting: Professional

## 2019-01-09 ENCOUNTER — Other Ambulatory Visit (HOSPITAL_COMMUNITY): Payer: Medicare Other | Attending: Psychiatry

## 2019-01-09 ENCOUNTER — Other Ambulatory Visit: Payer: Self-pay

## 2019-01-09 ENCOUNTER — Telehealth (HOSPITAL_COMMUNITY): Payer: Self-pay | Admitting: Professional

## 2019-01-10 ENCOUNTER — Telehealth (HOSPITAL_COMMUNITY): Payer: Self-pay | Admitting: Professional

## 2019-01-10 NOTE — Telephone Encounter (Signed)
Called patient to offer appointment. States had a fall since we last spoke - states "broke both ankles" - said was treated at [facility] Emergency room. States has been referred to and scheduled with orthopaedics in [place] on 01/15/19. States also has been diagnosed with lung cancer "stage 4", and is treating with oncologist. Michela Pitcher will get back with Dr Luna Glasgow after treatments. Aware of refill date for pain medication prescribed by Dr Luna Glasgow. Advised to discuss with orthopaedic specialist in [facility] when she has the appointment. Voiced understanding.

## 2019-01-17 ENCOUNTER — Other Ambulatory Visit: Payer: Self-pay

## 2019-01-17 ENCOUNTER — Encounter (HOSPITAL_COMMUNITY): Payer: Self-pay | Admitting: Emergency Medicine

## 2019-01-17 ENCOUNTER — Emergency Department (HOSPITAL_COMMUNITY)
Admission: EM | Admit: 2019-01-17 | Discharge: 2019-01-18 | Disposition: A | Discharge status: Home / Self Care | Payer: Medicare Other | Attending: Emergency Medicine | Admitting: Emergency Medicine

## 2019-01-17 ENCOUNTER — Emergency Department (HOSPITAL_COMMUNITY): Payer: Medicare Other

## 2019-01-17 DIAGNOSIS — J45909 Unspecified asthma, uncomplicated: Secondary | ICD-10-CM | POA: Diagnosis not present

## 2019-01-17 DIAGNOSIS — J449 Chronic obstructive pulmonary disease, unspecified: Secondary | ICD-10-CM | POA: Diagnosis not present

## 2019-01-17 DIAGNOSIS — R0602 Shortness of breath: Secondary | ICD-10-CM | POA: Diagnosis not present

## 2019-01-17 DIAGNOSIS — Z79899 Other long term (current) drug therapy: Secondary | ICD-10-CM | POA: Diagnosis not present

## 2019-01-17 DIAGNOSIS — F17210 Nicotine dependence, cigarettes, uncomplicated: Secondary | ICD-10-CM | POA: Insufficient documentation

## 2019-01-17 DIAGNOSIS — I5030 Unspecified diastolic (congestive) heart failure: Secondary | ICD-10-CM | POA: Insufficient documentation

## 2019-01-17 DIAGNOSIS — I252 Old myocardial infarction: Secondary | ICD-10-CM | POA: Diagnosis not present

## 2019-01-17 DIAGNOSIS — Z853 Personal history of malignant neoplasm of breast: Secondary | ICD-10-CM | POA: Diagnosis not present

## 2019-01-17 DIAGNOSIS — R0789 Other chest pain: Secondary | ICD-10-CM | POA: Insufficient documentation

## 2019-01-17 NOTE — ED Triage Notes (Signed)
Pt brought in by [facility] for chest pain and shortness of breath. Pt just d/c from [place] this morning for same.

## 2019-01-18 ENCOUNTER — Emergency Department (HOSPITAL_COMMUNITY)
Admission: EM | Admit: 2019-01-18 | Discharge: 2019-01-18 | Payer: Medicare Other | Source: Home / Self Care | Attending: Emergency Medicine | Admitting: Emergency Medicine

## 2019-01-18 ENCOUNTER — Emergency Department (HOSPITAL_COMMUNITY)
Admission: EM | Admit: 2019-01-18 | Discharge: 2019-01-18 | Disposition: A | Discharge status: Home / Self Care | Payer: Medicare Other | Source: Home / Self Care | Attending: Emergency Medicine | Admitting: Emergency Medicine

## 2019-01-18 ENCOUNTER — Encounter (HOSPITAL_COMMUNITY): Payer: Self-pay | Admitting: Emergency Medicine

## 2019-01-18 ENCOUNTER — Other Ambulatory Visit: Payer: Self-pay

## 2019-01-18 DIAGNOSIS — Z853 Personal history of malignant neoplasm of breast: Secondary | ICD-10-CM | POA: Insufficient documentation

## 2019-01-18 DIAGNOSIS — R0789 Other chest pain: Secondary | ICD-10-CM

## 2019-01-18 DIAGNOSIS — I252 Old myocardial infarction: Secondary | ICD-10-CM | POA: Insufficient documentation

## 2019-01-18 DIAGNOSIS — I5030 Unspecified diastolic (congestive) heart failure: Secondary | ICD-10-CM | POA: Insufficient documentation

## 2019-01-18 DIAGNOSIS — M069 Rheumatoid arthritis, unspecified: Secondary | ICD-10-CM | POA: Insufficient documentation

## 2019-01-18 DIAGNOSIS — F17210 Nicotine dependence, cigarettes, uncomplicated: Secondary | ICD-10-CM | POA: Insufficient documentation

## 2019-01-18 DIAGNOSIS — Z79899 Other long term (current) drug therapy: Secondary | ICD-10-CM | POA: Insufficient documentation

## 2019-01-18 DIAGNOSIS — J45909 Unspecified asthma, uncomplicated: Secondary | ICD-10-CM | POA: Insufficient documentation

## 2019-01-18 DIAGNOSIS — J449 Chronic obstructive pulmonary disease, unspecified: Secondary | ICD-10-CM | POA: Insufficient documentation

## 2019-01-18 DIAGNOSIS — I509 Heart failure, unspecified: Secondary | ICD-10-CM | POA: Insufficient documentation

## 2019-01-18 LAB — CBC
HCT: 39 % (ref 36.0–46.0)
Hemoglobin: 12.2 g/dL (ref 12.0–15.0)
MCH: 27.2 pg (ref 26.0–34.0)
MCHC: 31.3 g/dL (ref 30.0–36.0)
MCV: 87.1 fL (ref 80.0–100.0)
Platelets: 278 10*3/uL (ref 150–400)
RBC: 4.48 MIL/uL (ref 3.87–5.11)
RDW: 13 % (ref 11.5–15.5)
WBC: 8.2 10*3/uL (ref 4.0–10.5)
nRBC: 0 % (ref 0.0–0.2)

## 2019-01-18 LAB — BASIC METABOLIC PANEL
Anion gap: 9 (ref 5–15)
BUN: 13 mg/dL (ref 6–20)
CO2: 23 mmol/L (ref 22–32)
Calcium: 8.4 mg/dL — ABNORMAL LOW (ref 8.9–10.3)
Chloride: 108 mmol/L (ref 98–111)
Creatinine, Ser: 0.69 mg/dL (ref 0.44–1.00)
GFR calc Af Amer: >60 mL/min (ref >60)
GFR calc non Af Amer: >60 mL/min (ref >60)
Glucose, Bld: 118 mg/dL — ABNORMAL HIGH (ref 70–99)
Potassium: 3.5 mmol/L (ref 3.5–5.1)
Sodium: 140 mmol/L (ref 135–145)

## 2019-01-18 LAB — BRAIN NATRIURETIC PEPTIDE: B Natriuretic Peptide: 87 pg/mL (ref 0.0–100.0)

## 2019-01-18 LAB — TROPONIN I: Troponin I: <0.03 ng/mL (ref <0.03)

## 2019-01-18 MED ORDER — ALBUTEROL SULFATE HFA 108 (90 BASE) MCG/ACT IN AERS
2.0000 | INHALATION_SPRAY | RESPIRATORY_TRACT | Status: DC | PRN
  Administered 2019-01-18: 2 via RESPIRATORY_TRACT
  Filled 2019-01-18: qty 6.7

## 2019-01-18 NOTE — Discharge Instructions (Addendum)
Follow up with your md

## 2019-01-18 NOTE — ED Provider Notes (Signed)
[facility] EMERGENCY DEPARTMENT Provider Note   CSN: [id number] Arrival date & time: 01/18/19  1943    History   Chief Complaint Chief Complaint  Patient presents with  . Fever    HPI Lynn Logan is a 48 y.o. female.     Pt presents to the ED today by the police for evaluation.  The pt was arrested for allegedly breaking and entering.  EMS did a temp check and it was mildly elevated, so the police brought her here.  Pt is afebrile here.  Pt was in the ED 4 times yesterday for atypical cp.  She was last seen here around 0400.  Pt denies any sob or known Covid exposures.     Past Medical History:  Diagnosis Date  . Anxiety   . Arthritis    Rheumatoid  . Asthma   . Breast cancer ([place])   . CHF (congestive heart failure) ([place])   . Chronic back pain   . Colon cancer ([place])   . COPD (chronic obstructive pulmonary disease) ([place])   . Depression   . Fibromyalgia   . Gout   . Heart attack ([place]) 2017  . Renal disorder   . Right lumbar radiculopathy   . Vitiligo    face    Patient Active Problem List   Diagnosis Date Noted  . Hypersomnia 05/11/2016  . COPD (chronic obstructive pulmonary disease) ([place]) 05/11/2016  . Tobacco user 05/11/2016  . Diastolic CHF ([place]) 25/95/6387  . History of orthopnea 04/06/2016  . Gall bladder polyp 03/09/2016  . Reactive airway disease 02/21/2016  . Healthcare maintenance 02/10/2016  . Arthritis of knee 01/24/2016  . Microcytic anemia 12/31/2015  . Obesity 11/26/2015  . Fibromyalgia 11/26/2015  . Stress incontinence 11/26/2015  . Right knee pain 11/16/2015  . Left knee pain 11/16/2015  . Knee pain 11/09/2015  . S/P vaginal hysterectomy 10/13/2015  . Right arm pain 06/24/2015  . Right leg pain 06/24/2015  . Back pain 06/11/2015  . Menorrhagia 05/21/2015  . Mallory-Weiss tear 05/21/2015  . Peripheral neuropathy 05/21/2015  . Depression 05/21/2015    Past Surgical History:  Procedure Laterality Date  . CESAREAN SECTION     . DILITATION & CURRETTAGE/HYSTROSCOPY WITH NOVASURE ABLATION N/A 07/07/2015   Procedure: HYSTEROSCOPY, UTERINE CURETTAGE, ENDOMETRIAL  ABLATION Uterine Cavity Length=6.5cm Uterine Cavity Width=4.5cm Power=161 Watts Time=1 minute 19 seconds;  Surgeon: Florian Buff, MD;  Location: [facility];  Service: Gynecology;  Laterality: N/A;  . POLYPECTOMY  07/07/2015   Procedure: POLYPECTOMY;  Surgeon: Florian Buff, MD;  Location: [facility];  Service: Gynecology;;  . SALPINGOOPHORECTOMY Bilateral 10/13/2015   Procedure: BILATERAL SALPINGO OOPHORECTOMY;  Surgeon: Florian Buff, MD;  Location: [facility];  Service: Gynecology;  Laterality: Bilateral;  . TUBAL LIGATION    . VAGINAL HYSTERECTOMY N/A 10/13/2015   Procedure: HYSTERECTOMY VAGINAL;  Surgeon: Florian Buff, MD;  Location: [facility];  Service: Gynecology;  Laterality: N/A;     OB History    Gravida  3   Para  3   Term      Preterm      AB      Living        SAB      TAB      Ectopic      Multiple      Live Births               Home Medications    Prior to Admission medications  Medication Sig Start Date End Date Taking? Authorizing Provider  albuterol (PROVENTIL HFA;VENTOLIN HFA) 108 (90 Base) MCG/ACT inhaler Inhale 2 puffs into the lungs every 6 (six) hours as needed. 05/08/16 05/08/17  [provider]  albuterol (PROVENTIL) (2.5 MG/3ML) 0.083% nebulizer solution Take 3 mLs (2.5 mg total) by nebulization every 6 (six) hours as needed for wheezing or shortness of breath. 03/29/16   Eustaquio Maize, MD  budesonide-formoterol [facility]) 160-4.5 MCG/ACT inhaler Inhale 2 puffs into the lungs 2 (two) times daily. 05/08/16 05/08/17  [provider]  cyclobenzaprine (FLEXERIL) 10 MG tablet Take 1 tablet by mouth at bedtime. 02/12/17   [provider]  estradiol (ESTRACE) 2 MG tablet Take 1 tablet (2 mg total) by mouth daily. Patient not taking: Reported on 12/06/2018 01/30/17   Florian Buff, MD  ferrous sulfate 324 (65  Fe) MG TBEC Take 1 tablet (325 mg total) by mouth daily. 01/06/16   Timmothy Euler, MD  HYDROcodone-acetaminophen (NORCO/VICODIN) 5-325 MG tablet Take 1 tablet by mouth every 6 (six) hours as needed for moderate pain (Must last 30 days). 12/24/18   Sanjuana Kava, MD  hydrOXYzine (ATARAX/VISTARIL) 25 MG tablet Take 1 tablet by mouth at bedtime.    [provider]  megestrol (MEGACE) 40 MG tablet Take 1 tablet (40 mg total) by mouth daily. Patient not taking: Reported on 12/06/2018 03/08/16   Dettinger, Fransisca Kaufmann, MD  naproxen (NAPROSYN) 500 MG tablet TAKE ONE TABLET BY MOUTH TWICE DAILY. TAKE WITH FOOD. Patient not taking: Reported on 12/06/2018 04/24/18   Sanjuana Kava, MD  polyethylene glycol powder (GLYCOLAX/MIRALAX) powder Take 17 g by mouth 2 (two) times daily as needed. Patient not taking: Reported on 12/06/2018 03/29/16   Eustaquio Maize, MD  potassium chloride SA (K-DUR,KLOR-CON) 20 MEQ tablet Take 2 tablets (40 mEq total) by mouth 2 (two) times daily. 03/25/16   Mesner, Corene Cornea, MD  pregabalin (LYRICA) 300 MG capsule Take 1 capsule (300 mg total) by mouth 2 (two) times daily. Patient not taking: Reported on 12/06/2018 01/30/17   Florian Buff, MD  promethazine (PHENERGAN) 25 MG tablet Take 1 tablet (25 mg total) by mouth every 6 (six) hours as needed. Patient not taking: Reported on 12/06/2018 03/07/17   Fredia Sorrow, MD  RaNITidine HCl (ZANTAC PO) Take 75 mg by mouth daily as needed (acid reflux).     [provider]  sertraline (ZOLOFT) 100 MG tablet Take 1 tablet (100 mg total) by mouth daily. 01/07/19   Norman Clay, MD    Family History Family History  Problem Relation Age of Onset  . Diabetes Mother   . Congestive Heart Failure Mother   . Depression Father   . Hypertension Father   . Cancer Father   . Heart disease Maternal Uncle   . Depression Maternal Grandmother     Social History Social History   Tobacco Use  . Smoking status: Heavy Tobacco Smoker     Packs/day: 1.00    Years: 30.00    Pack years: 30.00    Types: Cigarettes  . Smokeless tobacco: Former Network engineer Use Topics  . Alcohol use: No    Alcohol/week: 0.0 standard drinks  . Drug use: No    Comment: Hx of marijuana use - None now     Allergies   Toradol [ketorolac tromethamine]; Buspirone; Lyrica [pregabalin]; Gabapentin; Tramadol; Zofran [ondansetron hcl]; and Penicillins   Review of Systems Review of Systems  All other systems reviewed and are negative.  Physical Exam Updated Vital Signs BP 118/75 (BP Location: Left Arm)   Pulse 92   Temp (!) 97.3 F (36.3 C) (Oral)   Resp 18   Ht 5\' 4"  (1.626 m)   Wt 64.8 kg   LMP 09/12/2015 (Exact Date)   SpO2 100%   BMI 24.52 kg/m   Physical Exam Vitals signs and nursing note reviewed.  Constitutional:      Appearance: Normal appearance.  HENT:     Head: Normocephalic and atraumatic.     Right Ear: External ear normal.     Left Ear: External ear normal.     Nose: Nose normal.     Mouth/Throat:     Mouth: Mucous membranes are moist.     Pharynx: Oropharynx is clear.  Eyes:     Extraocular Movements: Extraocular movements intact.     Conjunctiva/sclera: Conjunctivae normal.     Pupils: Pupils are equal, round, and reactive to light.  Neck:     Musculoskeletal: Normal range of motion and neck supple.  Cardiovascular:     Rate and Rhythm: Normal rate and regular rhythm.     Pulses: Normal pulses.     Heart sounds: Normal heart sounds.  Pulmonary:     Effort: Pulmonary effort is normal.     Breath sounds: Wheezing present.  Abdominal:     General: Abdomen is flat. Bowel sounds are normal.     Palpations: Abdomen is soft.  Musculoskeletal: Normal range of motion.  Skin:    General: Skin is warm.     Capillary Refill: Capillary refill takes less than 2 seconds.  Neurological:     General: No focal deficit present.     Mental Status: She is alert and oriented to person, place, and time.   Psychiatric:        Mood and Affect: Mood normal.        Behavior: Behavior normal.      ED Treatments / Results  Labs (all labs ordered are listed, but only abnormal results are displayed) Labs Reviewed - No data to display  EKG None  Radiology Dg Chest 2 View  Result Date: 01/18/2019 CLINICAL DATA:  48 year old female with chest pain and shortness of breath EXAM: CHEST - 2 VIEW COMPARISON:  Chest radiograph dated 03/07/2017 FINDINGS: The heart size and mediastinal contours are within normal limits. Both lungs are clear. The visualized skeletal structures are unremarkable. IMPRESSION: No active cardiopulmonary disease. Electronically Signed   By: Anner Crete M.D.   On: 01/18/2019 00:02    Procedures Procedures (including critical care time)  Medications Ordered in ED Medications - No data to display   Initial Impression / Assessment and Plan / ED Course  I have reviewed the triage vital signs and the nursing notes.  Pertinent labs & imaging results that were available during my care of the patient were reviewed by me and considered in my medical decision making (see chart for details).        Pt is afebrile here.  She has just had several work ups in the last day.  Vitals nl.  No need for further eval.  She is stable to go to jail.  Final Clinical Impressions(s) / ED Diagnoses   Final diagnoses:  Atypical chest pain    ED Discharge Orders    None       Isla Pence, MD 01/18/19 2026

## 2019-01-18 NOTE — ED Provider Notes (Signed)
[facility] EMERGENCY DEPARTMENT Provider Note   CSN: [id number] Arrival date & time: 01/18/19  0626    History   Chief Complaint Chief Complaint  Patient presents with   Chest Pain    HPI Lynn Logan is a 48 y.o. female.     Patient return for the fourth time to an emergency department in the last 24 hours or chest pain.  Patient states now it hurts on the right side of her chest.  The history is provided by the patient.  Chest Pain  Pain location:  R chest Pain quality: aching   Pain radiates to:  Does not radiate Pain severity:  Mild Onset quality:  Sudden Timing:  Constant Progression:  Worsening Chronicity:  Recurrent Context: not breathing   Associated symptoms: no abdominal pain, no back pain, no cough, no fatigue and no headache     Past Medical History:  Diagnosis Date   Anxiety    Arthritis    Rheumatoid   Asthma    Breast cancer ([place])    CHF (congestive heart failure) ([facility])    Chronic back pain    Colon cancer ([facility])    COPD (chronic obstructive pulmonary disease) ([facility])    Depression    Fibromyalgia    Gout    Heart attack ([place]) 2017   Renal disorder    Right lumbar radiculopathy    Vitiligo    face    Patient Active Problem List   Diagnosis Date Noted   Hypersomnia 05/11/2016   COPD (chronic obstructive pulmonary disease) ([place]) 05/11/2016   Tobacco user 94/85/4627   Diastolic CHF ([place]) 03/50/0938   History of orthopnea 04/06/2016   Gall bladder polyp 03/09/2016   Reactive airway disease 02/21/2016   Healthcare maintenance 02/10/2016   Arthritis of knee 01/24/2016   Microcytic anemia 12/31/2015   Obesity 11/26/2015   Fibromyalgia 11/26/2015   Stress incontinence 11/26/2015   Right knee pain 11/16/2015   Left knee pain 11/16/2015   Knee pain 11/09/2015   S/P vaginal hysterectomy 10/13/2015   Right arm pain 06/24/2015   Right leg pain 06/24/2015   Back pain 06/11/2015   Menorrhagia  05/21/2015   Mallory-Weiss tear 05/21/2015   Peripheral neuropathy 05/21/2015   Depression 05/21/2015    Past Surgical History:  Procedure Laterality Date   CESAREAN SECTION     DILITATION & CURRETTAGE/HYSTROSCOPY WITH NOVASURE ABLATION N/A 07/07/2015   Procedure: HYSTEROSCOPY, UTERINE CURETTAGE, ENDOMETRIAL  ABLATION Uterine Cavity Length=6.5cm Uterine Cavity Width=4.5cm Power=161 Watts Time=1 minute 19 seconds;  Surgeon: Florian Buff, MD;  Location: [facility];  Service: Gynecology;  Laterality: N/A;   POLYPECTOMY  07/07/2015   Procedure: POLYPECTOMY;  Surgeon: Florian Buff, MD;  Location: [facility];  Service: Gynecology;;   SALPINGOOPHORECTOMY Bilateral 10/13/2015   Procedure: BILATERAL SALPINGO OOPHORECTOMY;  Surgeon: Florian Buff, MD;  Location: [facility];  Service: Gynecology;  Laterality: Bilateral;   TUBAL LIGATION     VAGINAL HYSTERECTOMY N/A 10/13/2015   Procedure: HYSTERECTOMY VAGINAL;  Surgeon: Florian Buff, MD;  Location: [facility];  Service: Gynecology;  Laterality: N/A;     OB History    Gravida  3   Para  3   Term      Preterm      AB      Living        SAB      TAB      Ectopic      Multiple  Live Births               Home Medications    Prior to Admission medications   Medication Sig Start Date End Date Taking? Authorizing Provider  albuterol (PROVENTIL HFA;VENTOLIN HFA) 108 (90 Base) MCG/ACT inhaler Inhale 2 puffs into the lungs every 6 (six) hours as needed. 05/08/16 05/08/17  [provider]  albuterol (PROVENTIL) (2.5 MG/3ML) 0.083% nebulizer solution Take 3 mLs (2.5 mg total) by nebulization every 6 (six) hours as needed for wheezing or shortness of breath. 03/29/16   Eustaquio Maize, MD  budesonide-formoterol [facility]) 160-4.5 MCG/ACT inhaler Inhale 2 puffs into the lungs 2 (two) times daily. 05/08/16 05/08/17  [provider]  cyclobenzaprine (FLEXERIL) 10 MG tablet Take 1 tablet by mouth at bedtime. 02/12/17   [provider]  estradiol (ESTRACE) 2 MG tablet Take 1 tablet (2 mg total) by mouth daily. Patient not taking: Reported on 12/06/2018 01/30/17   Florian Buff, MD  ferrous sulfate 324 (65 Fe) MG TBEC Take 1 tablet (325 mg total) by mouth daily. 01/06/16   Timmothy Euler, MD  HYDROcodone-acetaminophen (NORCO/VICODIN) 5-325 MG tablet Take 1 tablet by mouth every 6 (six) hours as needed for moderate pain (Must last 30 days). 12/24/18   Sanjuana Kava, MD  hydrOXYzine (ATARAX/VISTARIL) 25 MG tablet Take 1 tablet by mouth at bedtime.    [provider]  megestrol (MEGACE) 40 MG tablet Take 1 tablet (40 mg total) by mouth daily. Patient not taking: Reported on 12/06/2018 03/08/16   Dettinger, Fransisca Kaufmann, MD  naproxen (NAPROSYN) 500 MG tablet TAKE ONE TABLET BY MOUTH TWICE DAILY. TAKE WITH FOOD. Patient not taking: Reported on 12/06/2018 04/24/18   Sanjuana Kava, MD  polyethylene glycol powder (GLYCOLAX/MIRALAX) powder Take 17 g by mouth 2 (two) times daily as needed. Patient not taking: Reported on 12/06/2018 03/29/16   Eustaquio Maize, MD  potassium chloride SA (K-DUR,KLOR-CON) 20 MEQ tablet Take 2 tablets (40 mEq total) by mouth 2 (two) times daily. 03/25/16   Mesner, Corene Cornea, MD  pregabalin (LYRICA) 300 MG capsule Take 1 capsule (300 mg total) by mouth 2 (two) times daily. Patient not taking: Reported on 12/06/2018 01/30/17   Florian Buff, MD  promethazine (PHENERGAN) 25 MG tablet Take 1 tablet (25 mg total) by mouth every 6 (six) hours as needed. Patient not taking: Reported on 12/06/2018 03/07/17   Fredia Sorrow, MD  RaNITidine HCl (ZANTAC PO) Take 75 mg by mouth daily as needed (acid reflux).     [provider]  sertraline (ZOLOFT) 100 MG tablet Take 1 tablet (100 mg total) by mouth daily. 01/07/19   Norman Clay, MD    Family History Family History  Problem Relation Age of Onset   Diabetes Mother    Congestive Heart Failure Mother    Depression Father    Hypertension  Father    Cancer Father    Heart disease Maternal Uncle    Depression Maternal Grandmother     Social History Social History   Tobacco Use   Smoking status: Heavy Tobacco Smoker    Packs/day: 1.00    Years: 30.00    Pack years: 30.00    Types: Cigarettes   Smokeless tobacco: Former Systems developer  Substance Use Topics   Alcohol use: No    Alcohol/week: 0.0 standard drinks   Drug use: No    Comment: Hx of marijuana use - None now     Allergies   Toradol Air cabin crew  tromethamine]; Buspirone; Lyrica [pregabalin]; Gabapentin; Tramadol; Zofran [ondansetron hcl]; and Penicillins   Review of Systems Review of Systems  Constitutional: Negative for appetite change and fatigue.  HENT: Negative for congestion, ear discharge and sinus pressure.   Eyes: Negative for discharge.  Respiratory: Negative for cough.   Cardiovascular: Positive for chest pain.  Gastrointestinal: Negative for abdominal pain and diarrhea.  Genitourinary: Negative for frequency and hematuria.  Musculoskeletal: Negative for back pain.  Skin: Negative for rash.  Neurological: Negative for seizures and headaches.  Psychiatric/Behavioral: Negative for hallucinations.     Physical Exam Updated Vital Signs BP 117/73 (BP Location: Left Arm)    Pulse 84    Temp 98.1 F (36.7 C) (Oral)    Resp 16    Ht 5\' 4"  (1.626 m)    Wt 64.8 kg    LMP 09/12/2015 (Exact Date)    SpO2 98%    BMI 24.52 kg/m   Physical Exam Vitals signs reviewed.  Constitutional:      Appearance: She is well-developed.  HENT:     Head: Normocephalic.     Nose: Nose normal.  Eyes:     General: No scleral icterus.    Conjunctiva/sclera: Conjunctivae normal.  Neck:     Musculoskeletal: Neck supple.     Thyroid: No thyromegaly.  Cardiovascular:     Rate and Rhythm: Normal rate and regular rhythm.     Heart sounds: No murmur. No friction rub. No gallop.      Comments: Right side chest tenderness Pulmonary:     Breath sounds: No stridor. No  wheezing or rales.  Chest:     Chest wall: No tenderness.  Abdominal:     General: There is no distension.     Tenderness: There is no abdominal tenderness. There is no rebound.  Musculoskeletal: Normal range of motion.  Lymphadenopathy:     Cervical: No cervical adenopathy.  Skin:    Findings: No erythema or rash.  Neurological:     Mental Status: She is oriented to person, place, and time.     Motor: No abnormal muscle tone.     Coordination: Coordination normal.  Psychiatric:        Behavior: Behavior normal.      ED Treatments / Results  Labs (all labs ordered are listed, but only abnormal results are displayed) Labs Reviewed - No data to display  EKG None  Radiology Dg Chest 2 View  Result Date: 01/18/2019 CLINICAL DATA:  48 year old female with chest pain and shortness of breath EXAM: CHEST - 2 VIEW COMPARISON:  Chest radiograph dated 03/07/2017 FINDINGS: The heart size and mediastinal contours are within normal limits. Both lungs are clear. The visualized skeletal structures are unremarkable. IMPRESSION: No active cardiopulmonary disease. Electronically Signed   By: Anner Crete M.D.   On: 01/18/2019 00:02    Procedures Procedures (including critical care time)  Medications Ordered in ED Medications - No data to display   Initial Impression / Assessment and Plan / ED Course  I have reviewed the triage vital signs and the nursing notes.  Pertinent labs & imaging results that were available during my care of the patient were reviewed by me and considered in my medical decision making (see chart for details).    EKG unremarkable.  Patient has chest wall pain.  Noncardiac causes of pain.  Patient will be discharged home  Final Clinical Impressions(s) / ED Diagnoses   Final diagnoses:  Chest wall pain    ED Discharge  Orders    None       Milton Ferguson, MD 01/18/19 [phone]

## 2019-01-18 NOTE — ED Notes (Signed)
Pt ambulatory in police custody to waiting room. Pt verbalized understanding of discharge instructions.

## 2019-01-18 NOTE — ED Triage Notes (Signed)
Pt was arrested and was suspected of having a fever so was brought in for medical clearance prior to going directly to jail. Pt afebrile here.

## 2019-01-18 NOTE — Discharge Instructions (Addendum)
Follow-up with your family doctor next week.  Use the albuterol inhaler for shortness of breath

## 2019-01-18 NOTE — ED Provider Notes (Signed)
[facility] EMERGENCY DEPARTMENT Provider Note   CSN: [id number] Arrival date & time: 01/17/19  2318    History   Chief Complaint Chief Complaint  Patient presents with  . Chest Pain    HPI Lynn Logan is a 48 y.o. female.     Patient complains of shortness of breath and chest pain.  She is been seen twice in the last 24 hours at the emergency department in the evening and was discharged home  The history is provided by the patient.  Chest Pain  Pain location:  Substernal area Pain quality: aching   Pain radiates to:  Does not radiate Pain severity:  Mild Onset quality:  Gradual Timing:  Intermittent Progression:  Waxing and waning Chronicity:  Recurrent Relieved by:  Nothing Worsened by:  Nothing Associated symptoms: shortness of breath   Associated symptoms: no abdominal pain, no back pain, no cough, no fatigue and no headache     Past Medical History:  Diagnosis Date  . Anxiety   . Arthritis    Rheumatoid  . Asthma   . Breast cancer ([place])   . CHF (congestive heart failure) ( [place])   . Chronic back pain   . Colon cancer ([place])   . COPD (chronic obstructive pulmonary disease) ([place])   . Depression   . Fibromyalgia   . Gout   . Heart attack ([place]) 2017  . Renal disorder   . Right lumbar radiculopathy   . Vitiligo    face    Patient Active Problem List   Diagnosis Date Noted  . Hypersomnia 05/11/2016  . COPD (chronic obstructive pulmonary disease) ([place]) 05/11/2016  . Tobacco user 05/11/2016  . Diastolic CHF ([place]) 09/32/6712  . History of orthopnea 04/06/2016  . Gall bladder polyp 03/09/2016  . Reactive airway disease 02/21/2016  . Healthcare maintenance 02/10/2016  . Arthritis of knee 01/24/2016  . Microcytic anemia 12/31/2015  . Obesity 11/26/2015  . Fibromyalgia 11/26/2015  . Stress incontinence 11/26/2015  . Right knee pain 11/16/2015  . Left knee pain 11/16/2015  . Knee pain 11/09/2015  . S/P vaginal hysterectomy 10/13/2015  . Right arm  pain 06/24/2015  . Right leg pain 06/24/2015  . Back pain 06/11/2015  . Menorrhagia 05/21/2015  . Mallory-Weiss tear 05/21/2015  . Peripheral neuropathy 05/21/2015  . Depression 05/21/2015    Past Surgical History:  Procedure Laterality Date  . CESAREAN SECTION    . DILITATION & CURRETTAGE/HYSTROSCOPY WITH NOVASURE ABLATION N/A 07/07/2015   Procedure: HYSTEROSCOPY, UTERINE CURETTAGE, ENDOMETRIAL  ABLATION Uterine Cavity Length=6.5cm Uterine Cavity Width=4.5cm Power=161 Watts Time=1 minute 19 seconds;  Surgeon: Florian Buff, MD;  Location: [facility];  Service: Gynecology;  Laterality: N/A;  . POLYPECTOMY  07/07/2015   Procedure: POLYPECTOMY;  Surgeon: Florian Buff, MD;  Location: [facility];  Service: Gynecology;;  . SALPINGOOPHORECTOMY Bilateral 10/13/2015   Procedure: BILATERAL SALPINGO OOPHORECTOMY;  Surgeon: Florian Buff, MD;  Location: [facility];  Service: Gynecology;  Laterality: Bilateral;  . TUBAL LIGATION    . VAGINAL HYSTERECTOMY N/A 10/13/2015   Procedure: HYSTERECTOMY VAGINAL;  Surgeon: Florian Buff, MD;  Location: [facility];  Service: Gynecology;  Laterality: N/A;     OB History    Gravida  3   Para  3   Term      Preterm      AB      Living        SAB      TAB  Ectopic      Multiple      Live Births               Home Medications    Prior to Admission medications   Medication Sig Start Date End Date Taking? Authorizing Provider  albuterol (PROVENTIL HFA;VENTOLIN HFA) 108 (90 Base) MCG/ACT inhaler Inhale 2 puffs into the lungs every 6 (six) hours as needed. 05/08/16 05/08/17  [provider]  albuterol (PROVENTIL) (2.5 MG/3ML) 0.083% nebulizer solution Take 3 mLs (2.5 mg total) by nebulization every 6 (six) hours as needed for wheezing or shortness of breath. 03/29/16   Eustaquio Maize, MD  budesonide-formoterol [facility]) 160-4.5 MCG/ACT inhaler Inhale 2 puffs into the lungs 2 (two) times daily. 05/08/16 05/08/17  [provider]   cyclobenzaprine (FLEXERIL) 10 MG tablet Take 1 tablet by mouth at bedtime. 02/12/17   [provider]  estradiol (ESTRACE) 2 MG tablet Take 1 tablet (2 mg total) by mouth daily. Patient not taking: Reported on 12/06/2018 01/30/17   Florian Buff, MD  ferrous sulfate 324 (65 Fe) MG TBEC Take 1 tablet (325 mg total) by mouth daily. 01/06/16   Timmothy Euler, MD  HYDROcodone-acetaminophen (NORCO/VICODIN) 5-325 MG tablet Take 1 tablet by mouth every 6 (six) hours as needed for moderate pain (Must last 30 days). 12/24/18   Sanjuana Kava, MD  hydrOXYzine (ATARAX/VISTARIL) 25 MG tablet Take 1 tablet by mouth at bedtime.    [provider]  megestrol (MEGACE) 40 MG tablet Take 1 tablet (40 mg total) by mouth daily. Patient not taking: Reported on 12/06/2018 03/08/16   Dettinger, Fransisca Kaufmann, MD  naproxen (NAPROSYN) 500 MG tablet TAKE ONE TABLET BY MOUTH TWICE DAILY. TAKE WITH FOOD. Patient not taking: Reported on 12/06/2018 04/24/18   Sanjuana Kava, MD  polyethylene glycol powder (GLYCOLAX/MIRALAX) powder Take 17 g by mouth 2 (two) times daily as needed. Patient not taking: Reported on 12/06/2018 03/29/16   Eustaquio Maize, MD  potassium chloride SA (K-DUR,KLOR-CON) 20 MEQ tablet Take 2 tablets (40 mEq total) by mouth 2 (two) times daily. 03/25/16   Mesner, Corene Cornea, MD  pregabalin (LYRICA) 300 MG capsule Take 1 capsule (300 mg total) by mouth 2 (two) times daily. Patient not taking: Reported on 12/06/2018 01/30/17   Florian Buff, MD  promethazine (PHENERGAN) 25 MG tablet Take 1 tablet (25 mg total) by mouth every 6 (six) hours as needed. Patient not taking: Reported on 12/06/2018 03/07/17   Fredia Sorrow, MD  RaNITidine HCl (ZANTAC PO) Take 75 mg by mouth daily as needed (acid reflux).     [provider]  sertraline (ZOLOFT) 100 MG tablet Take 1 tablet (100 mg total) by mouth daily. 01/07/19   Norman Clay, MD    Family History Family History  Problem Relation Age of Onset  .  Diabetes Mother   . Congestive Heart Failure Mother   . Depression Father   . Hypertension Father   . Cancer Father   . Heart disease Maternal Uncle   . Depression Maternal Grandmother     Social History Social History   Tobacco Use  . Smoking status: Heavy Tobacco Smoker    Packs/day: 1.00    Years: 30.00    Pack years: 30.00    Types: Cigarettes  . Smokeless tobacco: Former Network engineer Use Topics  . Alcohol use: No    Alcohol/week: 0.0 standard drinks  . Drug use: No    Comment: Hx of marijuana use -  None now     Allergies   Toradol [ketorolac tromethamine]; Buspirone; Lyrica [pregabalin]; Gabapentin; Tramadol; Zofran [ondansetron hcl]; and Penicillins   Review of Systems Review of Systems  Constitutional: Negative for appetite change and fatigue.  HENT: Negative for congestion, ear discharge and sinus pressure.   Eyes: Negative for discharge.  Respiratory: Positive for shortness of breath. Negative for cough.   Cardiovascular: Positive for chest pain.  Gastrointestinal: Negative for abdominal pain and diarrhea.  Genitourinary: Negative for frequency and hematuria.  Musculoskeletal: Negative for back pain.  Skin: Negative for rash.  Neurological: Negative for seizures and headaches.  Psychiatric/Behavioral: Negative for hallucinations.     Physical Exam Updated Vital Signs BP (!) 142/73   Pulse 81   Temp 97.9 F (36.6 C) (Oral)   Resp 18   Ht 5\' 4"  (1.626 m)   Wt 64.9 kg   LMP 09/12/2015 (Exact Date)   SpO2 98%   BMI 24.55 kg/m   Physical Exam Vitals signs and nursing note reviewed.  Constitutional:      Appearance: She is well-developed.  HENT:     Head: Normocephalic.     Nose: Nose normal.  Eyes:     General: No scleral icterus.    Conjunctiva/sclera: Conjunctivae normal.  Neck:     Musculoskeletal: Neck supple.     Thyroid: No thyromegaly.  Cardiovascular:     Rate and Rhythm: Normal rate and regular rhythm.     Heart sounds: No  murmur. No friction rub. No gallop.   Pulmonary:     Breath sounds: No stridor. Wheezing present. No rales.  Chest:     Chest wall: No tenderness.  Abdominal:     General: There is no distension.     Tenderness: There is no abdominal tenderness. There is no rebound.  Musculoskeletal: Normal range of motion.  Lymphadenopathy:     Cervical: No cervical adenopathy.  Skin:    Findings: No erythema or rash.  Neurological:     Mental Status: She is oriented to person, place, and time.     Motor: No abnormal muscle tone.     Coordination: Coordination normal.  Psychiatric:        Behavior: Behavior normal.      ED Treatments / Results  Labs (all labs ordered are listed, but only abnormal results are displayed) Labs Reviewed  BASIC METABOLIC PANEL - Abnormal; Notable for the following components:      Result Value   Glucose, Bld 118 (*)    Calcium 8.4 (*)    All other components within normal limits  CBC  TROPONIN I  BRAIN NATRIURETIC PEPTIDE    EKG None  Radiology Dg Chest 2 View  Result Date: 01/18/2019 CLINICAL DATA:  48 year old female with chest pain and shortness of breath EXAM: CHEST - 2 VIEW COMPARISON:  Chest radiograph dated 03/07/2017 FINDINGS: The heart size and mediastinal contours are within normal limits. Both lungs are clear. The visualized skeletal structures are unremarkable. IMPRESSION: No active cardiopulmonary disease. Electronically Signed   By: Anner Crete M.D.   On: 01/18/2019 00:02    Procedures Procedures (including critical care time)  Medications Ordered in ED Medications  albuterol (VENTOLIN HFA) 108 (90 Base) MCG/ACT inhaler 2 puff (has no administration in time range)     Initial Impression / Assessment and Plan / ED Course  I have reviewed the triage vital signs and the nursing notes.  Pertinent labs & imaging results that were available during my care of  the patient were reviewed by me and considered in my medical decision making  (see chart for details).        Labs unremarkable.  Patient having minimal wheezing that is helped by albuterol inhaler.  Chest pain noncardiac.  Patient improved prior to discharge.  She will follow-up with her doctor and is given an albuterol inhaler  Final Clinical Impressions(s) / ED Diagnoses   Final diagnoses:  Atypical chest pain    ED Discharge Orders    None       Milton Ferguson, MD 01/18/19 [phone]

## 2019-01-18 NOTE — ED Notes (Signed)
Cigarettes and lighter taken by security , Purse placed on counter.  Pt reminded this is a non-smoking facility and the dangers of smoking in the hospital .  EDP aware

## 2019-01-18 NOTE — ED Triage Notes (Signed)
Pt recently discharged for chest pain and shortness of breath. Returns via EMS after walking to [place] [employer] and calling EMS from lobby for mid-epigastric chest pains.
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
## 2019-01-22 ENCOUNTER — Ambulatory Visit: Payer: Self-pay | Admitting: Orthopaedic Surgery

## 2019-01-26 ENCOUNTER — Other Ambulatory Visit: Payer: Self-pay

## 2019-01-27 NOTE — Telephone Encounter (Signed)
She got Tylenol #3 from another provider since she got my narcotic.  No refill.
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
## 2019-02-05 ENCOUNTER — Ambulatory Visit (HOSPITAL_COMMUNITY): Payer: Medicare Other | Admitting: Psychiatry

## 2019-02-05 ENCOUNTER — Other Ambulatory Visit: Payer: Self-pay

## 2020-02-10 NOTE — Telephone Encounter (Signed)
Error

## 2020-12-10 DIAGNOSIS — Z299 Encounter for prophylactic measures, unspecified: Secondary | ICD-10-CM | POA: Diagnosis not present

## 2020-12-10 DIAGNOSIS — Z0000 Encounter for general adult medical examination without abnormal findings: Secondary | ICD-10-CM | POA: Diagnosis not present

## 2020-12-10 DIAGNOSIS — I10 Essential (primary) hypertension: Secondary | ICD-10-CM | POA: Diagnosis not present

## 2020-12-10 DIAGNOSIS — I509 Heart failure, unspecified: Secondary | ICD-10-CM | POA: Diagnosis not present

## 2020-12-10 DIAGNOSIS — Z7189 Other specified counseling: Secondary | ICD-10-CM | POA: Diagnosis not present

## 2020-12-13 DIAGNOSIS — E7800 Pure hypercholesterolemia, unspecified: Secondary | ICD-10-CM | POA: Diagnosis not present

## 2020-12-13 DIAGNOSIS — E559 Vitamin D deficiency, unspecified: Secondary | ICD-10-CM | POA: Diagnosis not present

## 2020-12-13 DIAGNOSIS — R5383 Other fatigue: Secondary | ICD-10-CM | POA: Diagnosis not present

## 2020-12-13 DIAGNOSIS — Z79899 Other long term (current) drug therapy: Secondary | ICD-10-CM | POA: Diagnosis not present

## 2021-02-09 DIAGNOSIS — Z299 Encounter for prophylactic measures, unspecified: Secondary | ICD-10-CM | POA: Diagnosis not present

## 2021-02-09 DIAGNOSIS — J449 Chronic obstructive pulmonary disease, unspecified: Secondary | ICD-10-CM | POA: Diagnosis not present

## 2021-02-09 DIAGNOSIS — I10 Essential (primary) hypertension: Secondary | ICD-10-CM | POA: Diagnosis not present

## 2021-02-09 DIAGNOSIS — G629 Polyneuropathy, unspecified: Secondary | ICD-10-CM | POA: Diagnosis not present

## 2021-02-09 DIAGNOSIS — I509 Heart failure, unspecified: Secondary | ICD-10-CM | POA: Diagnosis not present

## 2021-02-10 DIAGNOSIS — Z20822 Contact with and (suspected) exposure to covid-19: Secondary | ICD-10-CM | POA: Diagnosis not present

## 2021-02-14 ENCOUNTER — Other Ambulatory Visit: Payer: Self-pay | Admitting: Internal Medicine

## 2021-02-14 DIAGNOSIS — Z139 Encounter for screening, unspecified: Secondary | ICD-10-CM

## 2021-02-15 DIAGNOSIS — R9431 Abnormal electrocardiogram [ECG] [EKG]: Secondary | ICD-10-CM | POA: Diagnosis not present

## 2021-02-15 DIAGNOSIS — Z880 Allergy status to penicillin: Secondary | ICD-10-CM | POA: Diagnosis not present

## 2021-02-15 DIAGNOSIS — R0602 Shortness of breath: Secondary | ICD-10-CM | POA: Diagnosis not present

## 2021-02-15 DIAGNOSIS — U071 COVID-19: Secondary | ICD-10-CM | POA: Diagnosis not present

## 2021-02-15 DIAGNOSIS — R079 Chest pain, unspecified: Secondary | ICD-10-CM | POA: Diagnosis not present

## 2021-02-15 DIAGNOSIS — R0600 Dyspnea, unspecified: Secondary | ICD-10-CM | POA: Diagnosis not present

## 2021-03-01 DIAGNOSIS — J449 Chronic obstructive pulmonary disease, unspecified: Secondary | ICD-10-CM | POA: Diagnosis not present

## 2021-03-01 DIAGNOSIS — R2689 Other abnormalities of gait and mobility: Secondary | ICD-10-CM | POA: Diagnosis not present

## 2021-03-21 DIAGNOSIS — I10 Essential (primary) hypertension: Secondary | ICD-10-CM | POA: Diagnosis not present

## 2021-03-21 DIAGNOSIS — F17210 Nicotine dependence, cigarettes, uncomplicated: Secondary | ICD-10-CM | POA: Diagnosis not present

## 2021-03-21 DIAGNOSIS — M5440 Lumbago with sciatica, unspecified side: Secondary | ICD-10-CM | POA: Diagnosis not present

## 2021-03-21 DIAGNOSIS — L03114 Cellulitis of left upper limb: Secondary | ICD-10-CM | POA: Diagnosis not present

## 2021-03-21 DIAGNOSIS — Z299 Encounter for prophylactic measures, unspecified: Secondary | ICD-10-CM | POA: Diagnosis not present

## 2021-03-21 DIAGNOSIS — I509 Heart failure, unspecified: Secondary | ICD-10-CM | POA: Diagnosis not present

## 2021-04-15 DIAGNOSIS — R2689 Other abnormalities of gait and mobility: Secondary | ICD-10-CM | POA: Diagnosis not present

## 2021-04-15 DIAGNOSIS — M545 Low back pain, unspecified: Secondary | ICD-10-CM | POA: Diagnosis not present

## 2021-04-15 DIAGNOSIS — Z789 Other specified health status: Secondary | ICD-10-CM | POA: Diagnosis not present

## 2021-04-15 DIAGNOSIS — R29898 Other symptoms and signs involving the musculoskeletal system: Secondary | ICD-10-CM | POA: Diagnosis not present

## 2021-04-15 DIAGNOSIS — M2560 Stiffness of unspecified joint, not elsewhere classified: Secondary | ICD-10-CM | POA: Diagnosis not present

## 2021-04-26 DIAGNOSIS — M25552 Pain in left hip: Secondary | ICD-10-CM | POA: Diagnosis not present

## 2021-04-26 DIAGNOSIS — M25551 Pain in right hip: Secondary | ICD-10-CM | POA: Diagnosis not present

## 2021-04-26 DIAGNOSIS — M545 Low back pain, unspecified: Secondary | ICD-10-CM | POA: Diagnosis not present

## 2021-04-26 DIAGNOSIS — G8929 Other chronic pain: Secondary | ICD-10-CM | POA: Diagnosis not present

## 2021-04-26 DIAGNOSIS — M2569 Stiffness of other specified joint, not elsewhere classified: Secondary | ICD-10-CM | POA: Diagnosis not present

## 2021-04-28 DIAGNOSIS — M545 Low back pain, unspecified: Secondary | ICD-10-CM | POA: Diagnosis not present

## 2021-04-28 DIAGNOSIS — R29898 Other symptoms and signs involving the musculoskeletal system: Secondary | ICD-10-CM | POA: Diagnosis not present

## 2021-04-28 DIAGNOSIS — M2560 Stiffness of unspecified joint, not elsewhere classified: Secondary | ICD-10-CM | POA: Diagnosis not present

## 2021-04-28 DIAGNOSIS — R2689 Other abnormalities of gait and mobility: Secondary | ICD-10-CM | POA: Diagnosis not present

## 2021-04-28 DIAGNOSIS — Z789 Other specified health status: Secondary | ICD-10-CM | POA: Diagnosis not present

## 2021-05-05 DIAGNOSIS — M2560 Stiffness of unspecified joint, not elsewhere classified: Secondary | ICD-10-CM | POA: Diagnosis not present

## 2021-05-05 DIAGNOSIS — M545 Low back pain, unspecified: Secondary | ICD-10-CM | POA: Diagnosis not present

## 2021-05-05 DIAGNOSIS — R29898 Other symptoms and signs involving the musculoskeletal system: Secondary | ICD-10-CM | POA: Diagnosis not present

## 2021-05-05 DIAGNOSIS — Z789 Other specified health status: Secondary | ICD-10-CM | POA: Diagnosis not present

## 2021-05-05 DIAGNOSIS — R2689 Other abnormalities of gait and mobility: Secondary | ICD-10-CM | POA: Diagnosis not present

## 2021-05-12 DIAGNOSIS — Z789 Other specified health status: Secondary | ICD-10-CM | POA: Diagnosis not present

## 2021-05-12 DIAGNOSIS — M2560 Stiffness of unspecified joint, not elsewhere classified: Secondary | ICD-10-CM | POA: Diagnosis not present

## 2021-05-12 DIAGNOSIS — R29898 Other symptoms and signs involving the musculoskeletal system: Secondary | ICD-10-CM | POA: Diagnosis not present

## 2021-05-12 DIAGNOSIS — M545 Low back pain, unspecified: Secondary | ICD-10-CM | POA: Diagnosis not present

## 2021-05-12 DIAGNOSIS — R2689 Other abnormalities of gait and mobility: Secondary | ICD-10-CM | POA: Diagnosis not present

## 2021-05-18 DIAGNOSIS — M2569 Stiffness of other specified joint, not elsewhere classified: Secondary | ICD-10-CM | POA: Diagnosis not present

## 2021-05-18 DIAGNOSIS — M25551 Pain in right hip: Secondary | ICD-10-CM | POA: Diagnosis not present

## 2021-05-18 DIAGNOSIS — M25552 Pain in left hip: Secondary | ICD-10-CM | POA: Diagnosis not present

## 2021-05-18 DIAGNOSIS — G8929 Other chronic pain: Secondary | ICD-10-CM | POA: Diagnosis not present

## 2021-05-18 DIAGNOSIS — M545 Low back pain, unspecified: Secondary | ICD-10-CM | POA: Diagnosis not present

## 2021-05-24 DIAGNOSIS — M25552 Pain in left hip: Secondary | ICD-10-CM | POA: Diagnosis not present

## 2021-05-24 DIAGNOSIS — M545 Low back pain, unspecified: Secondary | ICD-10-CM | POA: Diagnosis not present

## 2021-05-24 DIAGNOSIS — M25551 Pain in right hip: Secondary | ICD-10-CM | POA: Diagnosis not present

## 2021-05-24 DIAGNOSIS — M2569 Stiffness of other specified joint, not elsewhere classified: Secondary | ICD-10-CM | POA: Diagnosis not present

## 2021-05-24 DIAGNOSIS — G8929 Other chronic pain: Secondary | ICD-10-CM | POA: Diagnosis not present

## 2021-05-26 DIAGNOSIS — M25552 Pain in left hip: Secondary | ICD-10-CM | POA: Diagnosis not present

## 2021-05-26 DIAGNOSIS — M2569 Stiffness of other specified joint, not elsewhere classified: Secondary | ICD-10-CM | POA: Diagnosis not present

## 2021-05-26 DIAGNOSIS — M25551 Pain in right hip: Secondary | ICD-10-CM | POA: Diagnosis not present

## 2021-05-26 DIAGNOSIS — R2689 Other abnormalities of gait and mobility: Secondary | ICD-10-CM | POA: Diagnosis not present

## 2021-05-26 DIAGNOSIS — G8929 Other chronic pain: Secondary | ICD-10-CM | POA: Diagnosis not present

## 2021-05-26 DIAGNOSIS — R29898 Other symptoms and signs involving the musculoskeletal system: Secondary | ICD-10-CM | POA: Diagnosis not present

## 2021-05-26 DIAGNOSIS — Z9181 History of falling: Secondary | ICD-10-CM | POA: Diagnosis not present

## 2021-05-26 DIAGNOSIS — M545 Low back pain, unspecified: Secondary | ICD-10-CM | POA: Diagnosis not present

## 2021-06-01 DIAGNOSIS — R0789 Other chest pain: Secondary | ICD-10-CM | POA: Diagnosis not present

## 2021-06-06 DIAGNOSIS — I509 Heart failure, unspecified: Secondary | ICD-10-CM | POA: Diagnosis not present

## 2021-06-06 DIAGNOSIS — Z299 Encounter for prophylactic measures, unspecified: Secondary | ICD-10-CM | POA: Diagnosis not present

## 2021-06-06 DIAGNOSIS — I10 Essential (primary) hypertension: Secondary | ICD-10-CM | POA: Diagnosis not present

## 2021-06-06 DIAGNOSIS — G4700 Insomnia, unspecified: Secondary | ICD-10-CM | POA: Diagnosis not present

## 2021-06-06 DIAGNOSIS — I4891 Unspecified atrial fibrillation: Secondary | ICD-10-CM | POA: Diagnosis not present

## 2021-06-06 DIAGNOSIS — Z23 Encounter for immunization: Secondary | ICD-10-CM | POA: Diagnosis not present

## 2021-06-08 ENCOUNTER — Encounter: Payer: Self-pay | Admitting: Orthopaedic Surgery

## 2021-06-08 ENCOUNTER — Ambulatory Visit: Payer: Self-pay

## 2021-06-08 ENCOUNTER — Ambulatory Visit (INDEPENDENT_AMBULATORY_CARE_PROVIDER_SITE_OTHER): Payer: Medicare Other | Admitting: Orthopaedic Surgery

## 2021-06-08 VITALS — Ht 62.0 in | Wt 197.0 lb

## 2021-06-08 DIAGNOSIS — M25561 Pain in right knee: Secondary | ICD-10-CM | POA: Diagnosis not present

## 2021-06-08 DIAGNOSIS — G8929 Other chronic pain: Secondary | ICD-10-CM

## 2021-06-08 DIAGNOSIS — M25562 Pain in left knee: Secondary | ICD-10-CM | POA: Diagnosis not present

## 2021-06-08 MED ORDER — NAPROXEN 500 MG PO TABS: 500.0000 mg | ORAL_TABLET | Freq: Two times a day (BID) | ORAL | 5 refills | Status: DC

## 2021-06-08 NOTE — Progress Notes (Signed)
Both of my knees hurt.  She has bilateral knee pain today. She has swelling and popping.  The left is a little more painful.  She has no trauma, no redness. She has tried ice, heat, Tylenol with little help.  She has ROM 0 to 110 right, 0 to 105 left, bilateral crepitus, bilateral effusions, NV intact, limp left, knees are stable but tender medially bilaterally.  X-rays were done of both knees, reported separately.  Encounter Diagnosis  Name Primary?   Bilateral chronic knee pain Yes   PROCEDURE NOTE:  The patient requests injections of the left knee , verbal consent was obtained.  The left knee was prepped appropriately after time out was performed.   Sterile technique was observed and injection of 1 cc of Celestone 6 mg with several cc's of plain xylocaine. Anesthesia was provided by ethyl chloride and a 20-gauge needle was used to inject the knee area. The injection was tolerated well.  A band aid dressing was applied.  The patient was advised to apply ice later today and tomorrow to the injection sight as needed.   PROCEDURE NOTE:  The patient requests injections of the right knee , verbal consent was obtained.  The right knee was prepped appropriately after time out was performed.   Sterile technique was observed and injection of 1 cc of Celestone 6mg  with several cc's of plain xylocaine. Anesthesia was provided by ethyl chloride and a 20-gauge needle was used to inject the knee area. The injection was tolerated well.  A band aid dressing was applied.  The patient was advised to apply ice later today and tomorrow to the injection sight as needed.  I will begin Naprosyn 500 po bid pc.  Return in one month.  Call if any problem.  Precautions discussed.  Electronically Signed Sanjuana Kava, MD 9/28/20229:43 AM

## 2021-06-09 DIAGNOSIS — F32A Depression, unspecified: Secondary | ICD-10-CM | POA: Diagnosis not present

## 2021-06-09 DIAGNOSIS — L02415 Cutaneous abscess of right lower limb: Secondary | ICD-10-CM | POA: Diagnosis not present

## 2021-06-09 DIAGNOSIS — F17210 Nicotine dependence, cigarettes, uncomplicated: Secondary | ICD-10-CM | POA: Diagnosis not present

## 2021-06-09 DIAGNOSIS — I509 Heart failure, unspecified: Secondary | ICD-10-CM | POA: Diagnosis not present

## 2021-06-09 DIAGNOSIS — Z299 Encounter for prophylactic measures, unspecified: Secondary | ICD-10-CM | POA: Diagnosis not present

## 2021-06-09 DIAGNOSIS — I10 Essential (primary) hypertension: Secondary | ICD-10-CM | POA: Diagnosis not present

## 2021-06-13 DIAGNOSIS — I509 Heart failure, unspecified: Secondary | ICD-10-CM | POA: Diagnosis not present

## 2021-06-13 DIAGNOSIS — M419 Scoliosis, unspecified: Secondary | ICD-10-CM | POA: Diagnosis not present

## 2021-06-13 DIAGNOSIS — I491 Atrial premature depolarization: Secondary | ICD-10-CM | POA: Diagnosis not present

## 2021-06-13 DIAGNOSIS — K219 Gastro-esophageal reflux disease without esophagitis: Secondary | ICD-10-CM | POA: Diagnosis not present

## 2021-06-13 DIAGNOSIS — Z79899 Other long term (current) drug therapy: Secondary | ICD-10-CM | POA: Diagnosis not present

## 2021-06-13 DIAGNOSIS — Z20822 Contact with and (suspected) exposure to covid-19: Secondary | ICD-10-CM | POA: Diagnosis not present

## 2021-06-13 DIAGNOSIS — R9431 Abnormal electrocardiogram [ECG] [EKG]: Secondary | ICD-10-CM | POA: Diagnosis not present

## 2021-06-13 DIAGNOSIS — J449 Chronic obstructive pulmonary disease, unspecified: Secondary | ICD-10-CM | POA: Diagnosis not present

## 2021-06-13 DIAGNOSIS — F17210 Nicotine dependence, cigarettes, uncomplicated: Secondary | ICD-10-CM | POA: Diagnosis not present

## 2021-06-13 DIAGNOSIS — Z7951 Long term (current) use of inhaled steroids: Secondary | ICD-10-CM | POA: Diagnosis not present

## 2021-06-13 DIAGNOSIS — R110 Nausea: Secondary | ICD-10-CM | POA: Diagnosis not present

## 2021-06-13 DIAGNOSIS — B372 Candidiasis of skin and nail: Secondary | ICD-10-CM | POA: Diagnosis not present

## 2021-06-13 DIAGNOSIS — R000 Tachycardia, unspecified: Secondary | ICD-10-CM | POA: Diagnosis not present

## 2021-06-23 DIAGNOSIS — I509 Heart failure, unspecified: Secondary | ICD-10-CM | POA: Diagnosis not present

## 2021-06-23 DIAGNOSIS — Z299 Encounter for prophylactic measures, unspecified: Secondary | ICD-10-CM | POA: Diagnosis not present

## 2021-06-23 DIAGNOSIS — I4891 Unspecified atrial fibrillation: Secondary | ICD-10-CM | POA: Diagnosis not present

## 2021-06-23 DIAGNOSIS — K529 Noninfective gastroenteritis and colitis, unspecified: Secondary | ICD-10-CM | POA: Diagnosis not present

## 2021-06-27 DIAGNOSIS — M419 Scoliosis, unspecified: Secondary | ICD-10-CM | POA: Diagnosis not present

## 2021-06-27 DIAGNOSIS — I509 Heart failure, unspecified: Secondary | ICD-10-CM | POA: Diagnosis not present

## 2021-06-27 DIAGNOSIS — M79661 Pain in right lower leg: Secondary | ICD-10-CM | POA: Diagnosis not present

## 2021-06-27 DIAGNOSIS — M549 Dorsalgia, unspecified: Secondary | ICD-10-CM | POA: Diagnosis not present

## 2021-06-27 DIAGNOSIS — R1013 Epigastric pain: Secondary | ICD-10-CM | POA: Diagnosis not present

## 2021-06-27 DIAGNOSIS — R109 Unspecified abdominal pain: Secondary | ICD-10-CM | POA: Diagnosis not present

## 2021-06-27 DIAGNOSIS — Z5329 Procedure and treatment not carried out because of patient's decision for other reasons: Secondary | ICD-10-CM | POA: Diagnosis not present

## 2021-06-27 DIAGNOSIS — R079 Chest pain, unspecified: Secondary | ICD-10-CM | POA: Diagnosis not present

## 2021-06-27 DIAGNOSIS — F32A Depression, unspecified: Secondary | ICD-10-CM | POA: Diagnosis not present

## 2021-06-27 DIAGNOSIS — F17210 Nicotine dependence, cigarettes, uncomplicated: Secondary | ICD-10-CM | POA: Diagnosis not present

## 2021-06-27 DIAGNOSIS — J449 Chronic obstructive pulmonary disease, unspecified: Secondary | ICD-10-CM | POA: Diagnosis not present

## 2021-06-27 DIAGNOSIS — Z049 Encounter for examination and observation for unspecified reason: Secondary | ICD-10-CM | POA: Diagnosis not present

## 2021-06-27 DIAGNOSIS — R600 Localized edema: Secondary | ICD-10-CM | POA: Diagnosis not present

## 2021-06-29 DIAGNOSIS — Z299 Encounter for prophylactic measures, unspecified: Secondary | ICD-10-CM | POA: Diagnosis not present

## 2021-06-29 DIAGNOSIS — J449 Chronic obstructive pulmonary disease, unspecified: Secondary | ICD-10-CM | POA: Diagnosis not present

## 2021-06-29 DIAGNOSIS — K2970 Gastritis, unspecified, without bleeding: Secondary | ICD-10-CM | POA: Diagnosis not present

## 2021-06-29 DIAGNOSIS — I10 Essential (primary) hypertension: Secondary | ICD-10-CM | POA: Diagnosis not present

## 2021-07-01 DIAGNOSIS — J449 Chronic obstructive pulmonary disease, unspecified: Secondary | ICD-10-CM | POA: Diagnosis not present

## 2021-07-01 DIAGNOSIS — M79662 Pain in left lower leg: Secondary | ICD-10-CM | POA: Diagnosis not present

## 2021-07-01 DIAGNOSIS — I10 Essential (primary) hypertension: Secondary | ICD-10-CM | POA: Diagnosis not present

## 2021-07-01 DIAGNOSIS — I4891 Unspecified atrial fibrillation: Secondary | ICD-10-CM | POA: Diagnosis not present

## 2021-07-01 DIAGNOSIS — I509 Heart failure, unspecified: Secondary | ICD-10-CM | POA: Diagnosis not present

## 2021-07-01 DIAGNOSIS — M79672 Pain in left foot: Secondary | ICD-10-CM | POA: Diagnosis not present

## 2021-07-01 DIAGNOSIS — M47816 Spondylosis without myelopathy or radiculopathy, lumbar region: Secondary | ICD-10-CM | POA: Diagnosis not present

## 2021-07-01 DIAGNOSIS — Z043 Encounter for examination and observation following other accident: Secondary | ICD-10-CM | POA: Diagnosis not present

## 2021-07-01 DIAGNOSIS — F17210 Nicotine dependence, cigarettes, uncomplicated: Secondary | ICD-10-CM | POA: Diagnosis not present

## 2021-07-01 DIAGNOSIS — W1830XA Fall on same level, unspecified, initial encounter: Secondary | ICD-10-CM | POA: Diagnosis not present

## 2021-07-01 DIAGNOSIS — S0990XA Unspecified injury of head, initial encounter: Secondary | ICD-10-CM | POA: Diagnosis not present

## 2021-07-01 DIAGNOSIS — Z299 Encounter for prophylactic measures, unspecified: Secondary | ICD-10-CM | POA: Diagnosis not present

## 2021-07-01 DIAGNOSIS — M79605 Pain in left leg: Secondary | ICD-10-CM | POA: Diagnosis not present

## 2021-07-02 DIAGNOSIS — I509 Heart failure, unspecified: Secondary | ICD-10-CM | POA: Diagnosis not present

## 2021-07-02 DIAGNOSIS — I517 Cardiomegaly: Secondary | ICD-10-CM | POA: Diagnosis not present

## 2021-07-02 DIAGNOSIS — R600 Localized edema: Secondary | ICD-10-CM | POA: Diagnosis not present

## 2021-07-02 DIAGNOSIS — Z880 Allergy status to penicillin: Secondary | ICD-10-CM | POA: Diagnosis not present

## 2021-07-02 DIAGNOSIS — Z79899 Other long term (current) drug therapy: Secondary | ICD-10-CM | POA: Diagnosis not present

## 2021-07-02 DIAGNOSIS — R059 Cough, unspecified: Secondary | ICD-10-CM | POA: Diagnosis not present

## 2021-07-02 DIAGNOSIS — R609 Edema, unspecified: Secondary | ICD-10-CM | POA: Diagnosis not present

## 2021-07-02 DIAGNOSIS — J449 Chronic obstructive pulmonary disease, unspecified: Secondary | ICD-10-CM | POA: Diagnosis not present

## 2021-07-02 DIAGNOSIS — Z885 Allergy status to narcotic agent status: Secondary | ICD-10-CM | POA: Diagnosis not present

## 2021-07-05 DIAGNOSIS — I4891 Unspecified atrial fibrillation: Secondary | ICD-10-CM | POA: Diagnosis not present

## 2021-07-05 DIAGNOSIS — Z299 Encounter for prophylactic measures, unspecified: Secondary | ICD-10-CM | POA: Diagnosis not present

## 2021-07-05 DIAGNOSIS — R600 Localized edema: Secondary | ICD-10-CM | POA: Diagnosis not present

## 2021-07-05 DIAGNOSIS — J069 Acute upper respiratory infection, unspecified: Secondary | ICD-10-CM | POA: Diagnosis not present

## 2021-07-06 ENCOUNTER — Ambulatory Visit: Payer: Medicaid Other | Admitting: Orthopaedic Surgery

## 2021-07-06 DIAGNOSIS — I509 Heart failure, unspecified: Secondary | ICD-10-CM | POA: Diagnosis not present

## 2021-07-06 DIAGNOSIS — Z743 Need for continuous supervision: Secondary | ICD-10-CM | POA: Diagnosis not present

## 2021-07-06 DIAGNOSIS — I10 Essential (primary) hypertension: Secondary | ICD-10-CM | POA: Diagnosis not present

## 2021-07-06 DIAGNOSIS — Z299 Encounter for prophylactic measures, unspecified: Secondary | ICD-10-CM | POA: Diagnosis not present

## 2021-07-06 DIAGNOSIS — R109 Unspecified abdominal pain: Secondary | ICD-10-CM | POA: Diagnosis not present

## 2021-07-06 DIAGNOSIS — J449 Chronic obstructive pulmonary disease, unspecified: Secondary | ICD-10-CM | POA: Diagnosis not present

## 2021-07-06 DIAGNOSIS — R079 Chest pain, unspecified: Secondary | ICD-10-CM | POA: Diagnosis not present

## 2021-07-06 DIAGNOSIS — R404 Transient alteration of awareness: Secondary | ICD-10-CM | POA: Diagnosis not present

## 2021-07-06 DIAGNOSIS — I4891 Unspecified atrial fibrillation: Secondary | ICD-10-CM | POA: Diagnosis not present

## 2021-07-06 DIAGNOSIS — F17210 Nicotine dependence, cigarettes, uncomplicated: Secondary | ICD-10-CM | POA: Diagnosis not present

## 2021-07-06 DIAGNOSIS — R55 Syncope and collapse: Secondary | ICD-10-CM | POA: Diagnosis not present

## 2021-07-06 DIAGNOSIS — K5790 Diverticulosis of intestine, part unspecified, without perforation or abscess without bleeding: Secondary | ICD-10-CM | POA: Diagnosis not present

## 2021-07-06 DIAGNOSIS — R1111 Vomiting without nausea: Secondary | ICD-10-CM | POA: Diagnosis not present

## 2021-07-06 DIAGNOSIS — R0789 Other chest pain: Secondary | ICD-10-CM | POA: Diagnosis not present

## 2021-07-08 ENCOUNTER — Emergency Department (HOSPITAL_COMMUNITY)
Admission: EM | Admit: 2021-07-08 | Discharge: 2021-07-09 | Disposition: A | Discharge status: Home / Self Care | Payer: Medicare Other | Attending: Emergency Medicine | Admitting: Emergency Medicine

## 2021-07-08 ENCOUNTER — Other Ambulatory Visit: Payer: Self-pay

## 2021-07-08 DIAGNOSIS — I509 Heart failure, unspecified: Secondary | ICD-10-CM | POA: Diagnosis not present

## 2021-07-08 DIAGNOSIS — I10 Essential (primary) hypertension: Secondary | ICD-10-CM | POA: Diagnosis not present

## 2021-07-08 DIAGNOSIS — W19XXXA Unspecified fall, initial encounter: Secondary | ICD-10-CM | POA: Diagnosis not present

## 2021-07-08 DIAGNOSIS — Z5321 Procedure and treatment not carried out due to patient leaving prior to being seen by health care provider: Secondary | ICD-10-CM | POA: Insufficient documentation

## 2021-07-08 DIAGNOSIS — R55 Syncope and collapse: Secondary | ICD-10-CM | POA: Diagnosis not present

## 2021-07-08 DIAGNOSIS — R0689 Other abnormalities of breathing: Secondary | ICD-10-CM | POA: Diagnosis not present

## 2021-07-08 DIAGNOSIS — Z299 Encounter for prophylactic measures, unspecified: Secondary | ICD-10-CM | POA: Diagnosis not present

## 2021-07-08 LAB — URINALYSIS, ROUTINE W REFLEX MICROSCOPIC
Bacteria, UA: NONE SEEN
Bilirubin Urine: NEGATIVE
Glucose, UA: NEGATIVE mg/dL
Ketones, ur: NEGATIVE mg/dL
Leukocytes,Ua: NEGATIVE
Nitrite: NEGATIVE
Protein, ur: NEGATIVE mg/dL
Specific Gravity, Urine: 1.012 (ref 1.005–1.030)
pH: 6 (ref 5.0–8.0)

## 2021-07-08 LAB — BASIC METABOLIC PANEL
Anion gap: 8 (ref 5–15)
BUN: 11 mg/dL (ref 6–20)
CO2: 24 mmol/L (ref 22–32)
Calcium: 8.6 mg/dL — ABNORMAL LOW (ref 8.9–10.3)
Chloride: 106 mmol/L (ref 98–111)
Creatinine, Ser: 0.86 mg/dL (ref 0.44–1.00)
GFR, Estimated: >60 mL/min (ref >60)
Glucose, Bld: 93 mg/dL (ref 70–99)
Potassium: 3.3 mmol/L — ABNORMAL LOW (ref 3.5–5.1)
Sodium: 138 mmol/L (ref 135–145)

## 2021-07-08 LAB — CBC
HCT: 40.6 % (ref 36.0–46.0)
Hemoglobin: 13 g/dL (ref 12.0–15.0)
MCH: 25.6 pg — ABNORMAL LOW (ref 26.0–34.0)
MCHC: 32 g/dL (ref 30.0–36.0)
MCV: 79.9 fL — ABNORMAL LOW (ref 80.0–100.0)
Platelets: 322 10*3/uL (ref 150–400)
RBC: 5.08 MIL/uL (ref 3.87–5.11)
RDW: 14.7 % (ref 11.5–15.5)
WBC: 11.3 10*3/uL — ABNORMAL HIGH (ref 4.0–10.5)
nRBC: 0 % (ref 0.0–0.2)

## 2021-07-08 NOTE — ED Triage Notes (Signed)
Pt called EMS after she had syncopal fall at home. Pt states that this has been happening for the past 3 days. Pt states she was seen at [facility] recently for same. Pt ambulatory on scene with no difficulty or distress noted.   Per family on scene pt has hx of this for attention.

## 2021-07-09 NOTE — ED Provider Notes (Signed)
It appears that patient left the emergency room without being seen by a physician.    Merrily Pew, MD 07/09/21 [phone]

## 2021-07-11 DIAGNOSIS — J449 Chronic obstructive pulmonary disease, unspecified: Secondary | ICD-10-CM | POA: Diagnosis not present

## 2021-07-12 DIAGNOSIS — Z299 Encounter for prophylactic measures, unspecified: Secondary | ICD-10-CM | POA: Diagnosis not present

## 2021-07-12 DIAGNOSIS — I10 Essential (primary) hypertension: Secondary | ICD-10-CM | POA: Diagnosis not present

## 2021-07-12 DIAGNOSIS — I4891 Unspecified atrial fibrillation: Secondary | ICD-10-CM | POA: Diagnosis not present

## 2021-07-12 DIAGNOSIS — J449 Chronic obstructive pulmonary disease, unspecified: Secondary | ICD-10-CM | POA: Diagnosis not present

## 2021-07-12 DIAGNOSIS — I509 Heart failure, unspecified: Secondary | ICD-10-CM | POA: Diagnosis not present

## 2021-07-13 DIAGNOSIS — T50904A Poisoning by unspecified drugs, medicaments and biological substances, undetermined, initial encounter: Secondary | ICD-10-CM | POA: Diagnosis not present

## 2021-07-13 DIAGNOSIS — R112 Nausea with vomiting, unspecified: Secondary | ICD-10-CM | POA: Diagnosis not present

## 2021-07-13 DIAGNOSIS — R569 Unspecified convulsions: Secondary | ICD-10-CM | POA: Diagnosis not present

## 2021-07-13 DIAGNOSIS — F17210 Nicotine dependence, cigarettes, uncomplicated: Secondary | ICD-10-CM | POA: Diagnosis not present

## 2021-07-13 DIAGNOSIS — I2510 Atherosclerotic heart disease of native coronary artery without angina pectoris: Secondary | ICD-10-CM | POA: Diagnosis not present

## 2021-07-13 DIAGNOSIS — Z79899 Other long term (current) drug therapy: Secondary | ICD-10-CM | POA: Diagnosis not present

## 2021-07-13 DIAGNOSIS — I509 Heart failure, unspecified: Secondary | ICD-10-CM | POA: Diagnosis not present

## 2021-07-13 DIAGNOSIS — J449 Chronic obstructive pulmonary disease, unspecified: Secondary | ICD-10-CM | POA: Diagnosis not present

## 2021-07-13 DIAGNOSIS — Z885 Allergy status to narcotic agent status: Secondary | ICD-10-CM | POA: Diagnosis not present

## 2021-07-13 DIAGNOSIS — R079 Chest pain, unspecified: Secondary | ICD-10-CM | POA: Diagnosis not present

## 2021-07-13 DIAGNOSIS — M545 Low back pain, unspecified: Secondary | ICD-10-CM | POA: Diagnosis not present

## 2021-07-13 DIAGNOSIS — Z743 Need for continuous supervision: Secondary | ICD-10-CM | POA: Diagnosis not present

## 2021-07-13 DIAGNOSIS — R6889 Other general symptoms and signs: Secondary | ICD-10-CM | POA: Diagnosis not present

## 2021-07-13 DIAGNOSIS — F1090 Alcohol use, unspecified, uncomplicated: Secondary | ICD-10-CM | POA: Diagnosis not present

## 2021-07-13 DIAGNOSIS — G8929 Other chronic pain: Secondary | ICD-10-CM | POA: Diagnosis not present

## 2021-07-13 DIAGNOSIS — Z880 Allergy status to penicillin: Secondary | ICD-10-CM | POA: Diagnosis not present

## 2021-07-13 DIAGNOSIS — R197 Diarrhea, unspecified: Secondary | ICD-10-CM | POA: Diagnosis not present

## 2021-07-13 DIAGNOSIS — R58 Hemorrhage, not elsewhere classified: Secondary | ICD-10-CM | POA: Diagnosis not present

## 2021-07-15 DIAGNOSIS — F17210 Nicotine dependence, cigarettes, uncomplicated: Secondary | ICD-10-CM | POA: Diagnosis not present

## 2021-07-15 DIAGNOSIS — I509 Heart failure, unspecified: Secondary | ICD-10-CM | POA: Diagnosis not present

## 2021-07-15 DIAGNOSIS — I4891 Unspecified atrial fibrillation: Secondary | ICD-10-CM | POA: Diagnosis not present

## 2021-07-15 DIAGNOSIS — Z299 Encounter for prophylactic measures, unspecified: Secondary | ICD-10-CM | POA: Diagnosis not present

## 2021-07-18 ENCOUNTER — Emergency Department (HOSPITAL_COMMUNITY)
Admission: EM | Admit: 2021-07-18 | Discharge: 2021-07-18 | Discharge status: Against Medical Advice | Payer: Medicare Other | Source: Home / Self Care

## 2021-07-19 DIAGNOSIS — R55 Syncope and collapse: Secondary | ICD-10-CM | POA: Diagnosis not present

## 2021-07-20 ENCOUNTER — Ambulatory Visit: Payer: Medicare Other | Admitting: Orthopaedic Surgery

## 2021-07-21 DIAGNOSIS — R0902 Hypoxemia: Secondary | ICD-10-CM | POA: Diagnosis not present

## 2021-07-21 DIAGNOSIS — J449 Chronic obstructive pulmonary disease, unspecified: Secondary | ICD-10-CM | POA: Diagnosis not present

## 2021-07-21 DIAGNOSIS — F32A Depression, unspecified: Secondary | ICD-10-CM | POA: Diagnosis not present

## 2021-07-21 DIAGNOSIS — I509 Heart failure, unspecified: Secondary | ICD-10-CM | POA: Diagnosis not present

## 2021-07-21 DIAGNOSIS — Z79899 Other long term (current) drug therapy: Secondary | ICD-10-CM | POA: Diagnosis not present

## 2021-07-21 DIAGNOSIS — Z743 Need for continuous supervision: Secondary | ICD-10-CM | POA: Diagnosis not present

## 2021-07-21 DIAGNOSIS — F17210 Nicotine dependence, cigarettes, uncomplicated: Secondary | ICD-10-CM | POA: Diagnosis not present

## 2021-07-21 DIAGNOSIS — M419 Scoliosis, unspecified: Secondary | ICD-10-CM | POA: Diagnosis not present

## 2021-07-25 ENCOUNTER — Encounter (HOSPITAL_COMMUNITY): Payer: Self-pay | Admitting: *Deleted

## 2021-07-25 ENCOUNTER — Emergency Department (HOSPITAL_COMMUNITY)
Admission: EM | Admit: 2021-07-25 | Discharge: 2021-07-26 | Disposition: A | Discharge status: Other Acute Inpatient Hospital | Payer: Medicare Other | Source: Home / Self Care | Attending: Emergency Medicine | Admitting: Emergency Medicine

## 2021-07-25 DIAGNOSIS — Z20822 Contact with and (suspected) exposure to covid-19: Secondary | ICD-10-CM | POA: Insufficient documentation

## 2021-07-25 DIAGNOSIS — Z79899 Other long term (current) drug therapy: Secondary | ICD-10-CM | POA: Insufficient documentation

## 2021-07-25 DIAGNOSIS — F32A Depression, unspecified: Secondary | ICD-10-CM | POA: Diagnosis not present

## 2021-07-25 DIAGNOSIS — Z299 Encounter for prophylactic measures, unspecified: Secondary | ICD-10-CM | POA: Diagnosis not present

## 2021-07-25 DIAGNOSIS — R9431 Abnormal electrocardiogram [ECG] [EKG]: Secondary | ICD-10-CM | POA: Diagnosis not present

## 2021-07-25 DIAGNOSIS — Z853 Personal history of malignant neoplasm of breast: Secondary | ICD-10-CM | POA: Insufficient documentation

## 2021-07-25 DIAGNOSIS — I5030 Unspecified diastolic (congestive) heart failure: Secondary | ICD-10-CM | POA: Insufficient documentation

## 2021-07-25 DIAGNOSIS — I2510 Atherosclerotic heart disease of native coronary artery without angina pectoris: Secondary | ICD-10-CM | POA: Insufficient documentation

## 2021-07-25 DIAGNOSIS — F333 Major depressive disorder, recurrent, severe with psychotic symptoms: Secondary | ICD-10-CM | POA: Insufficient documentation

## 2021-07-25 DIAGNOSIS — I509 Heart failure, unspecified: Secondary | ICD-10-CM | POA: Diagnosis not present

## 2021-07-25 DIAGNOSIS — I10 Essential (primary) hypertension: Secondary | ICD-10-CM | POA: Diagnosis not present

## 2021-07-25 DIAGNOSIS — F315 Bipolar disorder, current episode depressed, severe, with psychotic features: Secondary | ICD-10-CM | POA: Diagnosis not present

## 2021-07-25 DIAGNOSIS — F17210 Nicotine dependence, cigarettes, uncomplicated: Secondary | ICD-10-CM | POA: Insufficient documentation

## 2021-07-25 DIAGNOSIS — Y900 Blood alcohol level of less than 20 mg/100 ml: Secondary | ICD-10-CM | POA: Insufficient documentation

## 2021-07-25 DIAGNOSIS — J45909 Unspecified asthma, uncomplicated: Secondary | ICD-10-CM | POA: Insufficient documentation

## 2021-07-25 DIAGNOSIS — J449 Chronic obstructive pulmonary disease, unspecified: Secondary | ICD-10-CM | POA: Insufficient documentation

## 2021-07-25 DIAGNOSIS — Z008 Encounter for other general examination: Secondary | ICD-10-CM

## 2021-07-25 DIAGNOSIS — Z85038 Personal history of other malignant neoplasm of large intestine: Secondary | ICD-10-CM | POA: Insufficient documentation

## 2021-07-25 LAB — COMPREHENSIVE METABOLIC PANEL
ALT: 17 U/L (ref 0–44)
AST: 19 U/L (ref 15–41)
Albumin: 3.3 g/dL — ABNORMAL LOW (ref 3.5–5.0)
Alkaline Phosphatase: 123 U/L (ref 38–126)
Anion gap: 8 (ref 5–15)
BUN: 13 mg/dL (ref 6–20)
CO2: 24 mmol/L (ref 22–32)
Calcium: 8.2 mg/dL — ABNORMAL LOW (ref 8.9–10.3)
Chloride: 106 mmol/L (ref 98–111)
Creatinine, Ser: 0.82 mg/dL (ref 0.44–1.00)
GFR, Estimated: >60 mL/min (ref >60)
Glucose, Bld: 127 mg/dL — ABNORMAL HIGH (ref 70–99)
Potassium: 2.8 mmol/L — ABNORMAL LOW (ref 3.5–5.1)
Sodium: 138 mmol/L (ref 135–145)
Total Bilirubin: 0.5 mg/dL (ref 0.3–1.2)
Total Protein: 6.1 g/dL — ABNORMAL LOW (ref 6.5–8.1)

## 2021-07-25 LAB — RAPID URINE DRUG SCREEN, HOSP PERFORMED
Amphetamines: NOT DETECTED
Barbiturates: NOT DETECTED
Benzodiazepines: NOT DETECTED
Cocaine: NOT DETECTED
Opiates: NOT DETECTED
Tetrahydrocannabinol: NOT DETECTED

## 2021-07-25 LAB — CBC WITH DIFFERENTIAL/PLATELET
Abs Immature Granulocytes: 0.02 10*3/uL (ref 0.00–0.07)
Basophils Absolute: 0 10*3/uL (ref 0.0–0.1)
Basophils Relative: 0 %
Eosinophils Absolute: 0.2 10*3/uL (ref 0.0–0.5)
Eosinophils Relative: 2 %
HCT: 38.7 % (ref 36.0–46.0)
Hemoglobin: 12.6 g/dL (ref 12.0–15.0)
Immature Granulocytes: 0 %
Lymphocytes Relative: 24 %
Lymphs Abs: 1.8 10*3/uL (ref 0.7–4.0)
MCH: 25.9 pg — ABNORMAL LOW (ref 26.0–34.0)
MCHC: 32.6 g/dL (ref 30.0–36.0)
MCV: 79.5 fL — ABNORMAL LOW (ref 80.0–100.0)
Monocytes Absolute: 0.4 10*3/uL (ref 0.1–1.0)
Monocytes Relative: 6 %
Neutro Abs: 5.2 10*3/uL (ref 1.7–7.7)
Neutrophils Relative %: 68 %
Platelets: 270 10*3/uL (ref 150–400)
RBC: 4.87 MIL/uL (ref 3.87–5.11)
RDW: 15.1 % (ref 11.5–15.5)
WBC: 7.6 10*3/uL (ref 4.0–10.5)
nRBC: 0 % (ref 0.0–0.2)

## 2021-07-25 LAB — URINALYSIS, ROUTINE W REFLEX MICROSCOPIC
Bilirubin Urine: NEGATIVE
Glucose, UA: NEGATIVE mg/dL
Ketones, ur: NEGATIVE mg/dL
Leukocytes,Ua: NEGATIVE
Nitrite: NEGATIVE
Protein, ur: NEGATIVE mg/dL
Specific Gravity, Urine: 1.011 (ref 1.005–1.030)
pH: 6 (ref 5.0–8.0)

## 2021-07-25 LAB — MAGNESIUM: Magnesium: 2 mg/dL (ref 1.7–2.4)

## 2021-07-25 LAB — BRAIN NATRIURETIC PEPTIDE: B Natriuretic Peptide: 62 pg/mL (ref 0.0–100.0)

## 2021-07-25 LAB — SALICYLATE LEVEL: Salicylate Lvl: <7 mg/dL — ABNORMAL LOW (ref 7.0–30.0)

## 2021-07-25 LAB — ACETAMINOPHEN LEVEL: Acetaminophen (Tylenol), Serum: <10 ug/mL — ABNORMAL LOW (ref 10–30)

## 2021-07-25 LAB — ETHANOL: Alcohol, Ethyl (B): <10 mg/dL (ref <10)

## 2021-07-25 MED ORDER — POTASSIUM CHLORIDE CRYS ER 20 MEQ PO TBCR
40.0000 meq | EXTENDED_RELEASE_TABLET | Freq: Once | ORAL | Status: AC
  Administered 2021-07-25: 40 meq via ORAL
  Filled 2021-07-25: qty 2

## 2021-07-25 MED ORDER — POLYETHYLENE GLYCOL 3350 17 G PO PACK: 17.0000 g | PACK | Freq: Two times a day (BID) | ORAL | Status: DC | PRN

## 2021-07-25 MED ORDER — POTASSIUM CHLORIDE CRYS ER 20 MEQ PO TBCR
20.0000 meq | EXTENDED_RELEASE_TABLET | Freq: Two times a day (BID) | ORAL | Status: DC
  Administered 2021-07-25 – 2021-07-26 (×2): 20 meq via ORAL
  Filled 2021-07-25: qty 2
  Filled 2021-07-25: qty 1

## 2021-07-25 MED ORDER — FUROSEMIDE 40 MG PO TABS
20.0000 mg | ORAL_TABLET | Freq: Every day | ORAL | Status: DC
  Administered 2021-07-26: 20 mg via ORAL
  Filled 2021-07-25: qty 1

## 2021-07-25 MED ORDER — ESTRADIOL 2 MG PO TABS
2.0000 mg | ORAL_TABLET | Freq: Every day | ORAL | Status: DC
  Filled 2021-07-25 (×2): qty 1

## 2021-07-25 MED ORDER — NITROGLYCERIN 0.4 MG SL SUBL: 0.4000 mg | SUBLINGUAL_TABLET | SUBLINGUAL | Status: DC | PRN

## 2021-07-25 MED ORDER — AMITRIPTYLINE HCL 25 MG PO TABS: 25.0000 mg | ORAL_TABLET | Freq: Every day | ORAL | Status: DC

## 2021-07-25 MED ORDER — PANTOPRAZOLE SODIUM 40 MG PO TBEC
40.0000 mg | DELAYED_RELEASE_TABLET | Freq: Every day | ORAL | Status: DC
  Administered 2021-07-26: 40 mg via ORAL
  Filled 2021-07-25: qty 1

## 2021-07-25 NOTE — ED Triage Notes (Signed)
Requesting mental health evaluation

## 2021-07-25 NOTE — ED Notes (Signed)
Daughter called and stated she does not feel the patient is safe to come home. She has made suicidal remarks to daughter in the past couple of weeks. Daughter states she thinks she needs inpatient admission to a mental health facility
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
## 2021-07-25 NOTE — ED Notes (Signed)
Pt walking to tele mental health at this time

## 2021-07-25 NOTE — ED Notes (Signed)
Pt placed in burgundy scrubs, pt wanded by security, belongings placed in a locker.
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
## 2021-07-25 NOTE — ED Notes (Signed)
Pt calling daughter

## 2021-07-26 ENCOUNTER — Inpatient Hospital Stay (HOSPITAL_COMMUNITY)
Admission: AD | Admit: 2021-07-26 | Discharge: 2021-08-04 | DRG: 885 | Disposition: A | Discharge status: Home / Self Care | Payer: Medicare Other | Source: Intra-hospital | Attending: Emergency Medicine | Admitting: Emergency Medicine

## 2021-07-26 ENCOUNTER — Encounter (HOSPITAL_COMMUNITY): Payer: Self-pay | Admitting: Student

## 2021-07-26 ENCOUNTER — Other Ambulatory Visit: Payer: Self-pay

## 2021-07-26 DIAGNOSIS — J449 Chronic obstructive pulmonary disease, unspecified: Secondary | ICD-10-CM | POA: Diagnosis present

## 2021-07-26 DIAGNOSIS — Z20822 Contact with and (suspected) exposure to covid-19: Secondary | ICD-10-CM | POA: Diagnosis present

## 2021-07-26 DIAGNOSIS — I2510 Atherosclerotic heart disease of native coronary artery without angina pectoris: Secondary | ICD-10-CM | POA: Diagnosis not present

## 2021-07-26 DIAGNOSIS — K219 Gastro-esophageal reflux disease without esophagitis: Secondary | ICD-10-CM | POA: Diagnosis not present

## 2021-07-26 DIAGNOSIS — F4310 Post-traumatic stress disorder, unspecified: Secondary | ICD-10-CM | POA: Diagnosis present

## 2021-07-26 DIAGNOSIS — I5030 Unspecified diastolic (congestive) heart failure: Secondary | ICD-10-CM | POA: Diagnosis not present

## 2021-07-26 DIAGNOSIS — N390 Urinary tract infection, site not specified: Secondary | ICD-10-CM | POA: Diagnosis present

## 2021-07-26 DIAGNOSIS — I252 Old myocardial infarction: Secondary | ICD-10-CM

## 2021-07-26 DIAGNOSIS — Z7951 Long term (current) use of inhaled steroids: Secondary | ICD-10-CM

## 2021-07-26 DIAGNOSIS — Z85038 Personal history of other malignant neoplasm of large intestine: Secondary | ICD-10-CM

## 2021-07-26 DIAGNOSIS — Z9221 Personal history of antineoplastic chemotherapy: Secondary | ICD-10-CM

## 2021-07-26 DIAGNOSIS — R45851 Suicidal ideations: Secondary | ICD-10-CM | POA: Diagnosis present

## 2021-07-26 DIAGNOSIS — Z853 Personal history of malignant neoplasm of breast: Secondary | ICD-10-CM

## 2021-07-26 DIAGNOSIS — I110 Hypertensive heart disease with heart failure: Secondary | ICD-10-CM | POA: Diagnosis not present

## 2021-07-26 DIAGNOSIS — Z720 Tobacco use: Secondary | ICD-10-CM | POA: Diagnosis present

## 2021-07-26 DIAGNOSIS — Z79890 Hormone replacement therapy (postmenopausal): Secondary | ICD-10-CM

## 2021-07-26 DIAGNOSIS — Z818 Family history of other mental and behavioral disorders: Secondary | ICD-10-CM

## 2021-07-26 DIAGNOSIS — K5909 Other constipation: Secondary | ICD-10-CM | POA: Diagnosis present

## 2021-07-26 DIAGNOSIS — M109 Gout, unspecified: Secondary | ICD-10-CM | POA: Diagnosis present

## 2021-07-26 DIAGNOSIS — Z90710 Acquired absence of both cervix and uterus: Secondary | ICD-10-CM

## 2021-07-26 DIAGNOSIS — Z833 Family history of diabetes mellitus: Secondary | ICD-10-CM | POA: Diagnosis not present

## 2021-07-26 DIAGNOSIS — F17210 Nicotine dependence, cigarettes, uncomplicated: Secondary | ICD-10-CM | POA: Diagnosis present

## 2021-07-26 DIAGNOSIS — F315 Bipolar disorder, current episode depressed, severe, with psychotic features: Principal | ICD-10-CM | POA: Diagnosis present

## 2021-07-26 DIAGNOSIS — M797 Fibromyalgia: Secondary | ICD-10-CM | POA: Diagnosis not present

## 2021-07-26 DIAGNOSIS — Z79899 Other long term (current) drug therapy: Secondary | ICD-10-CM

## 2021-07-26 DIAGNOSIS — F333 Major depressive disorder, recurrent, severe with psychotic symptoms: Secondary | ICD-10-CM | POA: Diagnosis not present

## 2021-07-26 DIAGNOSIS — G4700 Insomnia, unspecified: Secondary | ICD-10-CM | POA: Diagnosis present

## 2021-07-26 DIAGNOSIS — E876 Hypokalemia: Secondary | ICD-10-CM | POA: Diagnosis present

## 2021-07-26 DIAGNOSIS — Z923 Personal history of irradiation: Secondary | ICD-10-CM

## 2021-07-26 LAB — BASIC METABOLIC PANEL
Anion gap: 6 (ref 5–15)
BUN: 11 mg/dL (ref 6–20)
CO2: 23 mmol/L (ref 22–32)
Calcium: 8.1 mg/dL — ABNORMAL LOW (ref 8.9–10.3)
Chloride: 108 mmol/L (ref 98–111)
Creatinine, Ser: 0.75 mg/dL (ref 0.44–1.00)
GFR, Estimated: >60 mL/min (ref >60)
Glucose, Bld: 111 mg/dL — ABNORMAL HIGH (ref 70–99)
Potassium: 3.7 mmol/L (ref 3.5–5.1)
Sodium: 137 mmol/L (ref 135–145)

## 2021-07-26 LAB — RESP PANEL BY RT-PCR (FLU A&B, COVID) ARPGX2
Influenza A by PCR: NEGATIVE
Influenza B by PCR: NEGATIVE
SARS Coronavirus 2 by RT PCR: NEGATIVE

## 2021-07-26 MED ORDER — ESTRADIOL 1 MG PO TABS
2.0000 mg | ORAL_TABLET | Freq: Every day | ORAL | Status: DC
  Administered 2021-07-27 – 2021-08-04 (×9): 2 mg via ORAL
  Filled 2021-07-26 (×8): qty 1
  Filled 2021-07-26 (×2): qty 14
  Filled 2021-07-26: qty 1

## 2021-07-26 MED ORDER — NICOTINE 21 MG/24HR TD PT24
21.0000 mg | MEDICATED_PATCH | Freq: Once | TRANSDERMAL | Status: DC
  Administered 2021-07-26: 21 mg via TRANSDERMAL
  Filled 2021-07-26: qty 1

## 2021-07-26 MED ORDER — NICOTINE 21 MG/24HR TD PT24
21.0000 mg | MEDICATED_PATCH | Freq: Every day | TRANSDERMAL | Status: DC
  Administered 2021-07-27 – 2021-08-02 (×5): 21 mg via TRANSDERMAL
  Filled 2021-07-26 (×11): qty 1

## 2021-07-26 MED ORDER — NITROGLYCERIN 0.4 MG SL SUBL: 0.4000 mg | SUBLINGUAL_TABLET | SUBLINGUAL | Status: DC | PRN

## 2021-07-26 MED ORDER — POTASSIUM CHLORIDE CRYS ER 20 MEQ PO TBCR
20.0000 meq | EXTENDED_RELEASE_TABLET | Freq: Two times a day (BID) | ORAL | Status: DC
  Administered 2021-07-26 – 2021-08-04 (×18): 20 meq via ORAL
  Filled 2021-07-26 (×8): qty 1
  Filled 2021-07-26 (×2): qty 14
  Filled 2021-07-26: qty 1
  Filled 2021-07-26: qty 14
  Filled 2021-07-26 (×4): qty 1
  Filled 2021-07-26: qty 14
  Filled 2021-07-26 (×8): qty 1

## 2021-07-26 MED ORDER — ALUM & MAG HYDROXIDE-SIMETH 200-200-20 MG/5ML PO SUSP: 30.0000 mL | ORAL | Status: DC | PRN

## 2021-07-26 MED ORDER — AMITRIPTYLINE HCL 25 MG PO TABS
25.0000 mg | ORAL_TABLET | Freq: Every day | ORAL | Status: DC
  Administered 2021-07-26: 25 mg via ORAL
  Filled 2021-07-26 (×4): qty 1

## 2021-07-26 MED ORDER — PANTOPRAZOLE SODIUM 40 MG PO TBEC
40.0000 mg | DELAYED_RELEASE_TABLET | Freq: Every day | ORAL | Status: DC
  Administered 2021-07-27 – 2021-08-04 (×9): 40 mg via ORAL
  Filled 2021-07-26: qty 7
  Filled 2021-07-26 (×7): qty 1
  Filled 2021-07-26: qty 7
  Filled 2021-07-26 (×2): qty 1

## 2021-07-26 MED ORDER — ACETAMINOPHEN 325 MG PO TABS
650.0000 mg | ORAL_TABLET | Freq: Four times a day (QID) | ORAL | Status: DC | PRN
  Administered 2021-07-26 – 2021-07-31 (×5): 650 mg via ORAL
  Filled 2021-07-26 (×5): qty 2

## 2021-07-26 MED ORDER — TRAZODONE HCL 50 MG PO TABS
50.0000 mg | ORAL_TABLET | Freq: Every evening | ORAL | Status: DC | PRN
  Administered 2021-07-26: 50 mg via ORAL
  Filled 2021-07-26: qty 1

## 2021-07-26 MED ORDER — FUROSEMIDE 20 MG PO TABS
20.0000 mg | ORAL_TABLET | Freq: Every day | ORAL | Status: DC
  Administered 2021-07-27 – 2021-08-04 (×9): 20 mg via ORAL
  Filled 2021-07-26: qty 1
  Filled 2021-07-26: qty 7
  Filled 2021-07-26 (×7): qty 1
  Filled 2021-07-26: qty 7
  Filled 2021-07-26: qty 1

## 2021-07-26 MED ORDER — HYDROXYZINE HCL 25 MG PO TABS
25.0000 mg | ORAL_TABLET | Freq: Three times a day (TID) | ORAL | Status: DC | PRN
  Administered 2021-07-26 – 2021-08-03 (×8): 25 mg via ORAL
  Filled 2021-07-26 (×6): qty 1
  Filled 2021-07-26: qty 10
  Filled 2021-07-26 (×3): qty 1

## 2021-07-26 MED ORDER — POLYETHYLENE GLYCOL 3350 17 G PO PACK: 17.0000 g | PACK | Freq: Two times a day (BID) | ORAL | Status: DC | PRN

## 2021-07-26 MED ORDER — ACETAMINOPHEN 325 MG PO TABS
650.0000 mg | ORAL_TABLET | ORAL | Status: DC | PRN
  Administered 2021-07-26: 650 mg via ORAL
  Filled 2021-07-26: qty 2

## 2021-07-26 MED ORDER — MAGNESIUM HYDROXIDE 400 MG/5ML PO SUSP: 30.0000 mL | Freq: Every day | ORAL | Status: DC | PRN

## 2021-07-26 NOTE — ED Notes (Signed)
Pt transported by safe transport. Two bags of belongings including clothes, necklace, lighter and shoes given to safe transport. Pt ambulatory at disposition. Called daughter prior to departure.

## 2021-07-26 NOTE — Plan of Care (Signed)
Nurse discussed anxiety, depression and coping skills with patient.  

## 2021-07-26 NOTE — BH Assessment (Signed)
Per pt's nurse Otila Kluver, RN, pt's COVID and flu screenings are negative.

## 2021-07-26 NOTE — Tx Team (Signed)
Initial Treatment Plan 07/26/2021 8:03 PM Lynn Logan VOH:[id number]    PATIENT STRESSORS: Financial difficulties   Health problems   Marital or family conflict   Medication change or noncompliance   Traumatic event     PATIENT STRENGTHS: Average or above average intelligence  Communication skills  General fund of knowledge  Motivation for treatment/growth  Physical Health  Supportive family/friends     "Depression"  "Anxiety"  "Health"  "Family problems"             DISCHARGE CRITERIA:  Ability to meet basic life and health needs Adequate post-discharge living arrangements Improved stabilization in mood, thinking, and/or behavior Medical problems require only outpatient monitoring Motivation to continue treatment in a less acute level of care Need for constant or close observation no longer present Reduction of life-threatening or endangering symptoms to within safe limits Safe-care adequate arrangements made Verbal commitment to aftercare and medication compliance  PRELIMINARY DISCHARGE PLAN: Attend aftercare/continuing care group Attend PHP/IOP Outpatient therapy Return to previous living arrangement  PATIENT/FAMILY INVOLVEMENT: This treatment plan has been presented to and reviewed with the patient, Lynn Logan.  The patient and family have been given the opportunity to ask questions and make suggestions.  Grayland Ormond [place], [place] 07/26/2021, 8:03 PM
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
## 2021-07-26 NOTE — BH Assessment (Signed)
Per [facility] AC Tosin, RN, a bed will potentially be available for pt Tuesday (07/26/2021) pending her ability to complete ADLs, her potassium level, and a negative COVID test. Day shift AC to review for potential placement after results return.

## 2021-07-26 NOTE — Progress Notes (Signed)
Pt accepted to [facility] 307-2   Patient meets inpatient criteria per Pecolia Ades, NP   The attending provider will be Viann Fish, MD    Call report to [phone]    Angela Burke, RN @ [facility] ED notified.     Pt scheduled  to arrive at [place].    Mariea Clonts, MSW, LCSW-A  10:05 AM 07/26/2021

## 2021-07-26 NOTE — Progress Notes (Signed)
Tonight's wrap up group went over the daily reflection sheet. The patient rated her day as a 10 overall. The patient's goal for the day was to make a new friend and the patient stated that she achieved her goal. Patient was pleasant and appropriate in group.

## 2021-07-26 NOTE — ED Notes (Signed)
Ambulatory to restroom without assist.  

## 2021-07-26 NOTE — ED Provider Notes (Signed)
Emergency Medicine Observation Re-evaluation Note  Lynn Logan is a 50 y.o. female, seen on rounds today.  Pt initially presented to the ED for complaints of V70.1 Currently, the patient is resting in bed, texting on her telephone.  Physical Exam  BP 116/60 (BP Location: Right Arm)   Pulse 76   Temp 98.7 F (37.1 C) (Oral)   Resp 18   Wt 93.5 kg   LMP 09/12/2015 (Exact Date)   SpO2 95%   BMI 37.70 kg/m  Physical Exam General: Nontoxic appearance Cardiac: Normal heart rate Lungs: Normal respiratory rate Psych: No appearance of internal responsiveness  ED Course / MDM  EKG:EKG Interpretation  Date/Time:  Monday July 25 2021 16:38:06 EST Ventricular Rate:  66 PR Interval:  202 QRS Duration: 102 QT Interval:  494 QTC Calculation: 517 R Axis:   11 Text Interpretation: Normal sinus rhythm Prolonged QT Abnormal ECG Confirmed by Nanda Quinton [phone]) on 07/25/2021 4:45:44 PM  I have reviewed the labs performed to date as well as medications administered while in observation.  Recent changes in the last 24 hours include cooperative with efforts to help her.  She has been assessed by TTS who plans on admitting her psychiatrically.  Plan  Current plan is for psychiatric admission.  Lynn Logan is not under involuntary commitment.     Daleen Bo, MD 07/26/21 1006

## 2021-07-27 ENCOUNTER — Encounter (HOSPITAL_COMMUNITY): Payer: Self-pay

## 2021-07-27 DIAGNOSIS — F333 Major depressive disorder, recurrent, severe with psychotic symptoms: Secondary | ICD-10-CM

## 2021-07-27 DIAGNOSIS — F4310 Post-traumatic stress disorder, unspecified: Secondary | ICD-10-CM

## 2021-07-27 LAB — LIPID PANEL
Cholesterol: 177 mg/dL (ref 0–200)
HDL: 45 mg/dL (ref >40)
LDL Cholesterol: 98 mg/dL (ref 0–99)
Total CHOL/HDL Ratio: 3.9 RATIO
Triglycerides: 172 mg/dL — ABNORMAL HIGH (ref <150)
VLDL: 34 mg/dL (ref 0–40)

## 2021-07-27 LAB — TSH: TSH: 8.903 u[IU]/mL — ABNORMAL HIGH (ref 0.350–4.500)

## 2021-07-27 LAB — POTASSIUM: Potassium: 4.2 mmol/L (ref 3.5–5.1)

## 2021-07-27 LAB — HEMOGLOBIN A1C
Hgb A1c MFr Bld: 5.4 % (ref 4.8–5.6)
Mean Plasma Glucose: 108.28 mg/dL

## 2021-07-27 MED ORDER — MELATONIN 5 MG PO TABS
5.0000 mg | ORAL_TABLET | Freq: Every day | ORAL | Status: DC
  Administered 2021-07-27 – 2021-08-03 (×7): 5 mg via ORAL
  Filled 2021-07-27 (×8): qty 1
  Filled 2021-07-27 (×2): qty 7

## 2021-07-27 MED ORDER — ALBUTEROL SULFATE (2.5 MG/3ML) 0.083% IN NEBU: 2.5000 mg | INHALATION_SOLUTION | Freq: Four times a day (QID) | RESPIRATORY_TRACT | Status: DC | PRN

## 2021-07-27 MED ORDER — ARIPIPRAZOLE 5 MG PO TABS
5.0000 mg | ORAL_TABLET | Freq: Every day | ORAL | Status: DC
  Administered 2021-07-27 – 2021-07-28 (×2): 5 mg via ORAL
  Filled 2021-07-27 (×5): qty 1

## 2021-07-27 MED ORDER — CIPROFLOXACIN HCL 500 MG PO TABS
500.0000 mg | ORAL_TABLET | Freq: Two times a day (BID) | ORAL | Status: AC
  Administered 2021-07-27 – 2021-08-02 (×14): 500 mg via ORAL
  Filled 2021-07-27 (×2): qty 2
  Filled 2021-07-27: qty 1
  Filled 2021-07-27: qty 2
  Filled 2021-07-27 (×13): qty 1

## 2021-07-27 MED ORDER — ALBUTEROL SULFATE HFA 108 (90 BASE) MCG/ACT IN AERS: 2.0000 | INHALATION_SPRAY | Freq: Four times a day (QID) | RESPIRATORY_TRACT | Status: DC | PRN

## 2021-07-27 NOTE — [facility] Group Notes (Signed)
[place] Group Notes:  (Nursing/MT/Case Management/Adjunct)  Date:  07/27/2021  Time:  9:36 PM  Type of Therapy:  Psychoeducational Skills  Participation Level:  Active  Participation Quality:  Attentive  Affect:  Flat  Cognitive:  Appropriate  Insight:  Appropriate  Engagement in Group:  Off Topic  Modes of Intervention:  Education  Summary of Progress/Problems:The patient attended the BN.A.group this evening but was redirected by the guest speaker for talking about her grief versus drugs.   Archie Balboa S 07/27/2021, 9:36 PM

## 2021-07-27 NOTE — H&P (Addendum)
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 disorder.  At this time, patient denies having symptoms that criteria for hypomania or mania in the past.  Patient is mostly depressed and disorganized at this time.  Patient was also recently diagnosed with schizophrenia, however the patient describes her symptoms at the time of that diagnosis, as more major depressive disorder with psychotic features and schizophrenia.   #PTSD   PLAN: #Stop Elavil #Stop trazodone, due to allergy #Start Abilify 5 mg once daily for depression.  out of concern for mood cycling disorder, we will start Abilify for treating mood symptoms, and we will hold off on other serotonergic and first-line antidepressant medications.  If when the patient is more clear and less disorganized, she recounts a history that is  less consistent with bipolar disorder, and more consistent with unipolar depression, we can consider starting medication like sertraline, which she responded well to in the past. #Start melatonin for sleep aid  #Repeat potassium level, continue potassium supplementation   #Contact the legal guardian to consent for voluntary admission.  Patient reports her oldest son is her legal guardian, payee, and POA   #Prior to discharge, had family secure medications at home, so she does not have access to medications on which she can overdose and harm herself   #Follow-up thyroid labs, due to elevated TSH   Continue other medications as ordered  Janine Limbo, MD Psychiatrist
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
## 2021-07-27 NOTE — Progress Notes (Signed)
   07/27/21 2136  Psych Admission Type (Psych Patients Only)  Admission Status Voluntary  Psychosocial Assessment  Patient Complaints Anxiety;Sadness  Eye Contact Fair  Facial Expression Anxious;Sad;Worried  Affect Anxious;Appropriate to circumstance  Speech Logical/coherent  Interaction Assertive  Motor Activity Slow;Unsteady  Appearance/Hygiene Improved  Behavior Characteristics Cooperative;Appropriate to situation  Mood Anxious;Sad;Sullen  Thought Process  Coherency WDL  Content WDL  Delusions None reported or observed  Perception WDL  Hallucination None reported or observed  Judgment Impaired  Confusion None  Danger to Self  Current suicidal ideation? Denies  Danger to Others  Danger to Others None reported or observed
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
## 2021-07-27 NOTE — Progress Notes (Signed)
   07/27/21 0039  Psych Admission Type (Psych Patients Only)  Admission Status Voluntary  Psychosocial Assessment  Patient Complaints Anxiety  Eye Contact Fair  Facial Expression Anxious;Sad;Worried  Affect Anxious;Apprehensive;Depressed;Sad  Speech Logical/coherent  Interaction Other (Comment) (appropriate interaction)  Motor Activity Slow;Unsteady  Appearance/Hygiene Disheveled  Behavior Characteristics Appropriate to situation  Mood Anxious  Aggressive Behavior  Effect No apparent injury  Thought Process  Coherency WDL  Content WDL  Delusions None reported or observed  Hallucination None reported or observed  Judgment Impaired  Confusion None  Danger to Self  Current suicidal ideation? Denies  Danger to Others  Danger to Others None reported or observed

## 2021-07-27 NOTE — Group Note (Signed)
LCSW Group Therapy Note  07/27/2021    1:00 - 2:00 PM               Type of Therapy and Topic:  Group Therapy: Understanding the differences between fear and anxiety / Fear Ladder & Examining the Evidence  Participation Level:  Active   Description of Group:   In this group session, patients learned how to define and recognize the similarities differences between fear and anxiety. Patients will explore reactions we have to fear and anxiety. Patients identified a fear or something they feel anxious about. Patients analyzed scenarios and discussed both the positive and negative aspects to them. Patients were asked to identify if it is a fear or anxiety as well.  Patients were asked to provide their own examples. Patients will learn what to do for both fear and anxiety. CSW provided tools such as breaking things up into smaller steps, challenging automatic negative thinking / questions to ask, fear ladder and how to use gradual exposure. Patients will be asked to practice each tool with situations and to complete a fear ladder for their personal gain.   Therapeutic Goals: Patients will learn the difference between fear versus anxiety and the definitions of both. Patients will utilize scenarios and be able to identify if this is an example of a fear or anxiety. Patients will learn tools to handle both their fears and anxieties as well as discuss breaking it into smaller steps and exposure.  Patients will be asked to provide examples of their own and discuss how things have both positive and negative aspects.  Patients were provided questions to ask to challenge automatic negative thinking for anxiety.  Patients were provided a sample form of a fear ladder and asked to complete one for a fear.   Summary of Patient Progress:  Patient was engaged and participated in the group session. Patient shared their understanding of fear and anxiety   . Patient shared one fear and one anxiety. Patient reports  their plan to help with fear/anxiety to be .   Therapeutic Modalities:   Cognitive Behavioral Therapy Motivational Interviewing  Brief Therapy  Zachery Conch, LCSW  07/27/2021 2:08 PM

## 2021-07-27 NOTE — Progress Notes (Signed)
Chart indicates that patient has a legal guardian, however we don't have the documentation indicating that.  CSW called court of clerks to verify if patient has a guardian. Court of clerks in [place] reports no record that patient has a guardian.  CSW took ACTIVE FYI for guardianship off the chart.    Josceline Chenard, LCSW, [place] Social Worker  [facility]

## 2021-07-27 NOTE — BHH Group Notes (Signed)
Pt did not attend goals group. 

## 2021-07-27 NOTE — BH IP Treatment Plan (Signed)
Interdisciplinary Treatment and Diagnostic Plan Update  07/27/2021 Time of Session: 9:15am Lynn Logan MRN: [id number]  Principal Diagnosis: MDD (major depressive disorder), recurrent, severe, with psychosis (HCC)  Secondary Diagnoses: Principal Problem:   MDD (major depressive disorder), recurrent, severe, with psychosis (HCC) Active Problems:   Diastolic CHF (HCC)   COPD (chronic obstructive pulmonary disease) (HCC)   Tobacco user   PTSD (post-traumatic stress disorder)   Current Medications:  Current Facility-Administered Medications  Medication Dose Route Frequency Provider Last Rate Last Admin   acetaminophen (TYLENOL) tablet 650 mg  650 mg Oral Q6H PRN Dolby, Karen R, NP   650 mg at 07/26/21 1827   albuterol (PROVENTIL) (2.5 MG/3ML) 0.083% nebulizer solution 2.5 mg  2.5 mg Nebulization Q6H PRN Nwoko, Agnes I, NP       albuterol (VENTOLIN HFA) 108 (90 Base) MCG/ACT inhaler 2 puff  2 puff Inhalation Q6H PRN Nwoko, Agnes I, NP       alum & mag hydroxide-simeth (MAALOX/MYLANTA) 200-200-20 MG/5ML suspension 30 mL  30 mL Oral Q4H PRN Dolby, Karen R, NP       ARIPiprazole (ABILIFY) tablet 5 mg  5 mg Oral Daily Massengill, Nathan, MD   5 mg at 07/27/21 1405   ciprofloxacin (CIPRO) tablet 500 mg  500 mg Oral BID Nwoko, Agnes I, NP   500 mg at 07/27/21 1110   estradiol (ESTRACE) tablet 2 mg  2 mg Oral Daily Dolby, Karen R, NP   2 mg at 07/27/21 0921   furosemide (LASIX) tablet 20 mg  20 mg Oral Daily Dolby, Karen R, NP   20 mg at 07/27/21 0921   hydrOXYzine (ATARAX/VISTARIL) tablet 25 mg  25 mg Oral TID PRN Dolby, Karen R, NP   25 mg at 07/26/21 1826   magnesium hydroxide (MILK OF MAGNESIA) suspension 30 mL  30 mL Oral Daily PRN Dolby, Karen R, NP       melatonin tablet 5 mg  5 mg Oral QHS Massengill, Nathan, MD       nicotine (NICODERM CQ - dosed in mg/24 hours) patch 21 mg  21 mg Transdermal Q0600 Dolby, Karen R, NP   21 mg at 07/27/21 0920   nitroGLYCERIN (NITROSTAT) SL tablet 0.4  mg  0.4 mg Sublingual Q5 min PRN Dolby, Karen R, NP       pantoprazole (PROTONIX) EC tablet 40 mg  40 mg Oral Daily Dolby, Karen R, NP   40 mg at 07/27/21 0922   polyethylene glycol (MIRALAX / GLYCOLAX) packet 17 g  17 g Oral BID PRN Dolby, Karen R, NP       potassium chloride SA (KLOR-CON) CR tablet 20 mEq  20 mEq Oral BID Dolby, Karen R, NP   20 mEq at 07/27/21 0921   PTA Medications: Medications Prior to Admission  Medication Sig Dispense Refill Last Dose   albuterol (PROVENTIL HFA;VENTOLIN HFA) 108 (90 Base) MCG/ACT inhaler Inhale 2 puffs into the lungs every 6 (six) hours as needed.      albuterol (PROVENTIL) (2.5 MG/3ML) 0.083% nebulizer solution Take 3 mLs (2.5 mg total) by nebulization every 6 (six) hours as needed for wheezing or shortness of breath. 150 mL 1    amitriptyline (ELAVIL) 25 MG tablet Take 25 mg by mouth at bedtime.      budesonide-formoterol (SYMBICORT) 160-4.5 MCG/ACT inhaler Inhale 2 puffs into the lungs 2 (two) times daily.      cephALEXin (KEFLEX) 500 MG capsule Take 500 mg by mouth 3 (three) times daily. (  Patient not taking: No sig reported)      clonazePAM (KLONOPIN) 0.5 MG tablet Take 0.5 mg by mouth 2 (two) times daily.      cyclobenzaprine (FLEXERIL) 10 MG tablet Take 1 tablet by mouth at bedtime. (Patient not taking: Reported on 07/25/2021)      dicyclomine (BENTYL) 20 MG tablet Take by mouth. (Patient not taking: No sig reported)      estradiol (ESTRACE) 2 MG tablet Take 1 tablet (2 mg total) by mouth daily. 30 tablet 11    famotidine (PEPCID) 20 MG tablet Take 20 mg by mouth 2 (two) times daily. (Patient not taking: No sig reported)      ferrous sulfate 324 (65 Fe) MG TBEC Take 1 tablet (325 mg total) by mouth daily. 30 tablet 3    furosemide (LASIX) 20 MG tablet Take 20 mg by mouth daily.      HYDROcodone-acetaminophen (NORCO/VICODIN) 5-325 MG tablet Take 1 tablet by mouth every 6 (six) hours as needed for moderate pain (Must last 30 days). (Patient not taking:  No sig reported) 45 tablet 0    hydrOXYzine (ATARAX/VISTARIL) 25 MG tablet Take 1 tablet by mouth at bedtime. (Patient not taking: Reported on 07/25/2021)      hydrOXYzine (VISTARIL) 50 MG capsule       megestrol (MEGACE) 40 MG tablet Take 1 tablet (40 mg total) by mouth daily. 90 tablet 3    megestrol (MEGACE) 40 MG tablet Take 1 tablet by mouth daily. (Patient not taking: No sig reported)      Multiple Vitamin (MULTI-VITAMIN) tablet Take 1 tablet by mouth daily.      naproxen (NAPROSYN) 500 MG tablet Take 1 tablet (500 mg total) by mouth 2 (two) times daily with a meal. 60 tablet 5    nitroGLYCERIN (NITROSTAT) 0.4 MG SL tablet Place 0.4 mg under the tongue every 5 (five) minutes as needed.      Omega-3 Fatty Acids (FISH OIL) 1000 MG CAPS  (Patient not taking: No sig reported)      omeprazole (PRILOSEC) 40 MG capsule Take 1 capsule by mouth daily.      pantoprazole (PROTONIX) 40 MG tablet Take 40 mg by mouth daily.      polyethylene glycol powder (GLYCOLAX/MIRALAX) powder Take 17 g by mouth 2 (two) times daily as needed. 3350 g 1    potassium chloride SA (K-DUR,KLOR-CON) 20 MEQ tablet Take 2 tablets (40 mEq total) by mouth 2 (two) times daily. 28 tablet 0    pregabalin (LYRICA) 300 MG capsule Take 1 capsule (300 mg total) by mouth 2 (two) times daily. 60 capsule 1    promethazine (PHENERGAN) 25 MG tablet Take 1 tablet (25 mg total) by mouth every 6 (six) hours as needed. (Patient not taking: No sig reported) 12 tablet 1    RaNITidine HCl (ZANTAC PO) Take 75 mg by mouth daily as needed (acid reflux).  (Patient not taking: Reported on 07/25/2021)      RESTASIS 0.05 % ophthalmic emulsion 1 drop 2 (two) times daily.      sertraline (ZOLOFT) 100 MG tablet Take 1 tablet (100 mg total) by mouth daily. 30 tablet 1    sertraline (ZOLOFT) 100 MG tablet  (Patient not taking: No sig reported)       Patient Stressors: Financial difficulties   Health problems   Marital or family conflict   Medication  change or noncompliance   Traumatic event    Patient Strengths: Average or above average intelligence  Communication skills  General fund of knowledge  Motivation for treatment/growth  Physical Health  Supportive family/friends   Treatment Modalities: Medication Management, Group therapy, Case management,  1 to 1 session with clinician, Psychoeducation, Recreational therapy.   Physician Treatment Plan for Primary Diagnosis: MDD (major depressive disorder), recurrent, severe, with psychosis (HCC) Long Term Goal(s): Improvement in symptoms so as ready for discharge   Short Term Goals: Ability to identify and develop effective coping behaviors will improve Ability to maintain clinical measurements within normal limits will improve Compliance with prescribed medications will improve Ability to identify changes in lifestyle to reduce recurrence of condition will improve Ability to verbalize feelings will improve Ability to disclose and discuss suicidal ideas Ability to demonstrate self-control will improve  Medication Management: Evaluate patient's response, side effects, and tolerance of medication regimen.  Therapeutic Interventions: 1 to 1 sessions, Unit Group sessions and Medication administration.  Evaluation of Outcomes: Not Met  Physician Treatment Plan for Secondary Diagnosis: Principal Problem:   MDD (major depressive disorder), recurrent, severe, with psychosis (HCC) Active Problems:   Diastolic CHF (HCC)   COPD (chronic obstructive pulmonary disease) (HCC)   Tobacco user   PTSD (post-traumatic stress disorder)  Long Term Goal(s): Improvement in symptoms so as ready for discharge   Short Term Goals: Ability to identify and develop effective coping behaviors will improve Ability to maintain clinical measurements within normal limits will improve Compliance with prescribed medications will improve Ability to identify changes in lifestyle to reduce recurrence of  condition will improve Ability to verbalize feelings will improve Ability to disclose and discuss suicidal ideas Ability to demonstrate self-control will improve     Medication Management: Evaluate patient's response, side effects, and tolerance of medication regimen.  Therapeutic Interventions: 1 to 1 sessions, Unit Group sessions and Medication administration.  Evaluation of Outcomes: Not Met   RN Treatment Plan for Primary Diagnosis: MDD (major depressive disorder), recurrent, severe, with psychosis (HCC) Long Term Goal(s): Knowledge of disease and therapeutic regimen to maintain health will improve  Short Term Goals: Ability to remain free from injury will improve, Ability to participate in decision making will improve, Ability to verbalize feelings will improve, Ability to disclose and discuss suicidal ideas, and Ability to identify and develop effective coping behaviors will improve  Medication Management: RN will administer medications as ordered by provider, will assess and evaluate patient's response and provide education to patient for prescribed medication. RN will report any adverse and/or side effects to prescribing provider.  Therapeutic Interventions: 1 on 1 counseling sessions, Psychoeducation, Medication administration, Evaluate responses to treatment, Monitor vital signs and CBGs as ordered, Perform/monitor CIWA, COWS, AIMS and Fall Risk screenings as ordered, Perform wound care treatments as ordered.  Evaluation of Outcomes: Not Met   LCSW Treatment Plan for Primary Diagnosis: MDD (major depressive disorder), recurrent, severe, with psychosis (HCC) Long Term Goal(s): Safe transition to appropriate next level of care at discharge, Engage patient in therapeutic group addressing interpersonal concerns.  Short Term Goals: Engage patient in aftercare planning with referrals and resources, Increase social support, Increase emotional regulation, Facilitate acceptance of mental  health diagnosis and concerns, Identify triggers associated with mental health/substance abuse issues, and Increase skills for wellness and recovery  Therapeutic Interventions: Assess for all discharge needs, 1 to 1 time with Social worker, Explore available resources and support systems, Assess for adequacy in community support network, Educate family and significant other(s) on suicide prevention, Complete Psychosocial Assessment, Interpersonal group therapy.  Evaluation of Outcomes: Not Met     Progress in Treatment: Attending groups: Yes. Participating in groups: Yes. Taking medication as prescribed: Yes. Toleration medication: Yes. Family/Significant other contact made: Yes, individual(s) contacted:  Son Patient understands diagnosis: Yes. Discussing patient identified problems/goals with staff: Yes. Medical problems stabilized or resolved: Yes. Denies suicidal/homicidal ideation: Yes. Issues/concerns per patient self-inventory: No.   New problem(s) identified: No, Describe:  None   New Short Term/Long Term Goal(s): medication stabilization, elimination of SI thoughts, development of comprehensive mental wellness plan.   Patient Goals: "To go home, to court on Monday, and to open heart surgery on Tuesday"  Discharge Plan or Barriers: Patient recently admitted. CSW will continue to follow and assess for appropriate referrals and possible discharge planning.   Reason for Continuation of Hospitalization: Depression Medication stabilization Suicidal ideation  Estimated Length of Stay: 3 to 5 days    Scribe for Treatment Team:  M , LCSWA 07/27/2021 2:36 PM 

## 2021-07-27 NOTE — [facility] Group Notes (Signed)
PsychoEducational Group Note- Gratitude and the impact positive affirmations can have and impact one's mental health were discussed in group. Patients were educated on daily affirmations and the way gratitude can shift thinking. Patients were asked to read insights on gratitude and share one thing they are grateful for. Patient participated and was appropriate in group. Pt shared she is grateful for healthcare workers.

## 2021-07-28 LAB — T4, FREE: Free T4: 0.83 ng/dL (ref 0.61–1.12)

## 2021-07-28 LAB — POTASSIUM: Potassium: 4.3 mmol/L (ref 3.5–5.1)

## 2021-07-28 MED ORDER — ARIPIPRAZOLE 15 MG PO TABS
7.5000 mg | ORAL_TABLET | Freq: Every day | ORAL | Status: DC
  Administered 2021-07-29: 7.5 mg via ORAL
  Filled 2021-07-28 (×3): qty 1

## 2021-07-28 NOTE — Progress Notes (Signed)
[facility] MD Progress Note  07/28/2021 10:14 AM Lynn [facility]  MRN:  [id number]  Subjective: Lynn [facility] reports, "I'm doing a lot better since you all started me on my medicines.The only thing going on now with me is the redness on the back of my hands. My primary doctor told me it was due to poor circulation. That will be looked into once I got discharged from the [facility]. I think that I'm ready to go home. I'm feeling better".  Daily notes: Lynn Logan is seen, chart reviewed. The chart findings discussed with the treatment team. She presents alert, oriented & aware of situation. She is visible on the unit, attending group sessions. She thinks that she is benefiting from the  group sessions. However, she remains with disorganized thinking & contradicting stories about her medical issues. She reports today that she is doing a lot better now that she is back on her medications. She denies any side effects from her medications. She is complaining of the redness to the back of her hands which she attributed to poor circulation as she was informed by her primary care physician that it was due to her hx of CHF. Lynn Logan went on to say that she was scheduled to start chemo therapy next Tuesday, has a court date next Monday at 08:30 am prior to the day of her chemotherapy on Tuesday. She says her fiancee's daughter accused her of threatening her life after the fiancee's daughter accused her (patient) of causing her father's death. She also states that prior to her chemo therapy next Tuesday, she was scheduled for radiation therapy for stomach & lung cancer & has a schedule for an open heart surgery next Tuesday as well. And with the disorganized thinking process, she maintained that she has improved mood & her symptoms are getting better. She currently denies any symptoms of depression or anxiety. She denies any SIHI, AVH, delusional thoughts or paranoia. She does not appear to be responding to any internal stimuli. We have  increased her Abilify from 5 mg to 7.5 mg po daily for mood stabilization to start on 07-29-21 as we continue to monitor her mood symptoms. Reviewed vital signs (stable). Reviewed current lab reports , T4 0.83 (wnl). We will continue current plan of as already in progress.  Objective: 50 year old Caucasian female with prior hx of mental illness/psychiatric admissions. She has in the past been admitted to the [facility] psychiatric ward in [place] just of recent, at the [facility]. She was receiving mental health care on an outpatient basis at the [facility] in [place] [facility] with Dr. Sheralyn Boatman. She is currently admitted to the [facility] from the [facility] with complain of worsening suicidal ideation with plan & an attempt to cut her wrists. Patient also admitted to the [facility] staff that she was also seeing her dead fiance in her kitchen.  Principal Problem: MDD (major depressive disorder), recurrent, severe, with psychosis ([place])  Diagnosis: Principal Problem:   MDD (major depressive disorder), recurrent, severe, with psychosis ([place]) Active Problems:   Diastolic CHF ([place])   COPD (chronic obstructive pulmonary disease) ([place])   Tobacco user   PTSD (post-traumatic stress disorder)  Total Time spent with patient:  35 minutes  Past Psychiatric History: Major depressive disorder, PTSD  Past Medical History:  Past Medical History:  Diagnosis Date   Anxiety    Arthritis    Rheumatoid   Asthma    Breast cancer ([place])    CHF (congestive heart  failure) (HCC)    Chronic back pain    Colon cancer ([place])    COPD (chronic obstructive pulmonary disease) (HCC)    Depression    Fibromyalgia    Gout    Heart attack ([place]) 2017   Renal disorder    Right lumbar radiculopathy    Vitiligo    face    Past Surgical History:  Procedure Laterality Date   CESAREAN SECTION     DILITATION & CURRETTAGE/HYSTROSCOPY WITH NOVASURE ABLATION N/A 07/07/2015   Procedure: HYSTEROSCOPY, UTERINE CURETTAGE,  ENDOMETRIAL  ABLATION Uterine Cavity Length=6.5cm Uterine Cavity Width=4.5cm Power=161 Watts Time=1 minute 19 seconds;  Surgeon: Florian Buff, MD;  Location: [facility];  Service: Gynecology;  Laterality: N/A;   POLYPECTOMY  07/07/2015   Procedure: POLYPECTOMY;  Surgeon: Florian Buff, MD;  Location: [facility];  Service: Gynecology;;   SALPINGOOPHORECTOMY Bilateral 10/13/2015   Procedure: BILATERAL SALPINGO OOPHORECTOMY;  Surgeon: Florian Buff, MD;  Location: [facility];  Service: Gynecology;  Laterality: Bilateral;   TUBAL LIGATION     VAGINAL HYSTERECTOMY N/A 10/13/2015   Procedure: HYSTERECTOMY VAGINAL;  Surgeon: Florian Buff, MD;  Location: [facility];  Service: Gynecology;  Laterality: N/A;   Family History:  Family History  Problem Relation Age of Onset   Diabetes Mother    Congestive Heart Failure Mother    Depression Father    Hypertension Father    Cancer Father    Heart disease Maternal Uncle    Depression Maternal Grandmother    Family Psychiatric  History: See H&P.  Social History:  Social History   Substance and Sexual Activity  Alcohol Use No   Alcohol/week: 0.0 standard drinks     Social History   Substance and Sexual Activity  Drug Use No   Comment: Hx of marijuana use - None now    Social History   Socioeconomic History   Marital status: Divorced    Spouse name: Not on file   Number of children: 2   Years of education: 11   Highest education level: 11th grade  Occupational History   Not on file  Tobacco Use   Smoking status: Never    Passive exposure: Never   Smokeless tobacco: Never   Tobacco comments:    Patient denied use of tobacco  Substance and Sexual Activity   Alcohol use: No    Alcohol/week: 0.0 standard drinks   Drug use: No    Comment: Hx of marijuana use - None now   Sexual activity: Not Currently    Birth control/protection: Surgical  Other Topics Concern   Not on file  Social History Narrative   Lives with daughter in [place], [place].  Denied  use of tobacco, alcohol, all drug use.   Social Determinants of Health   Financial Resource Strain: Not on file  Food Insecurity: Not on file  Transportation Needs: Not on file  Physical Activity: Not on file  Stress: Not on file  Social Connections: Not on file   Additional Social History:   Sleep: Good  Appetite:  Good  Current Medications: Current Facility-Administered Medications  Medication Dose Route Frequency Provider Last Rate Last Admin   acetaminophen (TYLENOL) tablet 650 mg  650 mg Oral Q6H PRN Chalmers Guest, NP   650 mg at 07/28/21 0732   albuterol (PROVENTIL) (2.5 MG/3ML) 0.083% nebulizer solution 2.5 mg  2.5 mg Nebulization Q6H PRN Lindell Spar I, NP       albuterol (VENTOLIN HFA) 108 (90 Base)  MCG/ACT inhaler 2 puff  2 puff Inhalation Q6H PRN Lindell Spar I, NP       alum & mag hydroxide-simeth (MAALOX/MYLANTA) 200-200-20 MG/5ML suspension 30 mL  30 mL Oral Q4H PRN Chalmers Guest, NP       ARIPiprazole (ABILIFY) tablet 5 mg  5 mg Oral Daily Massengill, Nathan, MD   5 mg at 07/28/21 7078   ciprofloxacin (CIPRO) tablet 500 mg  500 mg Oral BID Lindell Spar I, NP   500 mg at 07/28/21 6754   estradiol (ESTRACE) tablet 2 mg  2 mg Oral Daily Chalmers Guest, NP   2 mg at 07/28/21 4920   furosemide (LASIX) tablet 20 mg  20 mg Oral Daily Chalmers Guest, NP   20 mg at 07/28/21 1007   hydrOXYzine (ATARAX/VISTARIL) tablet 25 mg  25 mg Oral TID PRN Chalmers Guest, NP   25 mg at 07/28/21 0732   magnesium hydroxide (MILK OF MAGNESIA) suspension 30 mL  30 mL Oral Daily PRN Chalmers Guest, NP       melatonin tablet 5 mg  5 mg Oral QHS Massengill, Ovid Curd, MD   5 mg at 07/27/21 2136   nicotine (NICODERM CQ - dosed in mg/24 hours) patch 21 mg  21 mg Transdermal Q0600 Chalmers Guest, NP   21 mg at 07/28/21 0720   nitroGLYCERIN (NITROSTAT) SL tablet 0.4 mg  0.4 mg Sublingual Q5 min PRN Chalmers Guest, NP       pantoprazole (PROTONIX) EC tablet 40 mg  40 mg Oral Daily Chalmers Guest, NP   40  mg at 07/28/21 0730   polyethylene glycol (MIRALAX / GLYCOLAX) packet 17 g  17 g Oral BID PRN Chalmers Guest, NP       potassium chloride SA (KLOR-CON) CR tablet 20 mEq  20 mEq Oral BID Chalmers Guest, NP   20 mEq at 07/28/21 0730    Lab Results:  Results for orders placed or performed during the [facility] encounter of 07/26/21 (from the past 48 hour(s))  Potassium     Status: None   Collection Time: 07/27/21  6:41 AM  Result Value Ref Range   Potassium 4.2 3.5 - 5.1 mmol/L    Comment: Performed at [facility], [place] [address], [place], [place] [address]  Hemoglobin A1c     Status: None   Collection Time: 07/27/21  6:41 AM  Result Value Ref Range   Hgb A1c MFr Bld 5.4 4.8 - 5.6 %    Comment: (NOTE) Pre diabetes:          5.7%-6.4%  Diabetes:              >6.4%  Glycemic control for   <7.0% adults with diabetes    Mean Plasma Glucose 108.28 mg/dL    Comment: Performed at [facility] [address], [place], [place] [address]  Lipid panel     Status: Abnormal   Collection Time: 07/27/21  6:41 AM  Result Value Ref Range   Cholesterol 177 0 - 200 mg/dL   Triglycerides 172 (H) <150 mg/dL   HDL 45 >40 mg/dL   Total CHOL/HDL Ratio 3.9 RATIO   VLDL 34 0 - 40 mg/dL   LDL Cholesterol 98 0 - 99 mg/dL    Comment:        Total Cholesterol/HDL:CHD Risk Coronary Heart Disease Risk Table  Men   Women  1/2 Average Risk   3.4   3.3  Average Risk       5.0   4.4  2 X Average Risk   9.6   7.1  3 X Average Risk  23.4   11.0        Use the calculated Patient Ratio above and the CHD Risk Table to determine the patient's CHD Risk.        ATP III CLASSIFICATION (LDL):  <100     mg/dL   Optimal  100-129  mg/dL   Near or Above                    Optimal  130-159  mg/dL   Borderline  160-189  mg/dL   High  >190     mg/dL   Very High Performed at [facility] [address], [place], [place] [address]   TSH     Status:  Abnormal   Collection Time: 07/27/21  6:41 AM  Result Value Ref Range   TSH 8.903 (H) 0.350 - 4.500 uIU/mL    Comment: Performed by a 3rd Generation assay with a functional sensitivity of <=0.01 uIU/mL. Performed at [facility], [place] [address], [place], [place] [address]   T4, free     Status: None   Collection Time: 07/27/21  6:34 PM  Result Value Ref Range   Free T4 0.83 0.61 - 1.12 ng/dL    Comment: (NOTE) Biotin ingestion may interfere with free T4 tests. If the results are inconsistent with the TSH level, previous test results, or the clinical presentation, then consider biotin interference. If needed, order repeat testing after stopping biotin. Performed at [facility], [place] [address] [place], [place] [address]   Potassium     Status: None   Collection Time: 07/28/21  6:21 AM  Result Value Ref Range   Potassium 4.3 3.5 - 5.1 mmol/L    Comment: Performed at [facility], [place] [address], [place], [place] [address]   Blood Alcohol level:  Lab Results  Component Value Date   ETH <10 47/42/5956   Metabolic Disorder Labs: Lab Results  Component Value Date   HGBA1C 5.4 07/27/2021   MPG 108.28 07/27/2021   No results found for: PROLACTIN Lab Results  Component Value Date   CHOL 177 07/27/2021   TRIG 172 (H) 07/27/2021   HDL 45 07/27/2021   CHOLHDL 3.9 07/27/2021   VLDL 34 07/27/2021   LDLCALC 98 07/27/2021   LDLCALC 124 (H) 12/31/2015   Physical Findings: AIMS:  , ,  ,  ,    CIWA:    COWS:     Musculoskeletal: Strength & Muscle Tone: within normal limits Gait & Station: normal Patient leans: N/A  Psychiatric Specialty Exam:  Presentation  General Appearance: Disheveled; Casual  Eye Contact:Fair  Speech:Clear and Coherent; Pressured  Speech Volume:Increased  Handedness:Right  Mood and Affect  Mood:Dysphoric  Affect:Tearful; Congruent  Thought Process  Thought Processes:Disorganized  Descriptions  of Associations:Tangential  Orientation:Full (Time, Place and Person)  Thought Content:Rumination  History of Schizophrenia/Schizoaffective disorder:Yes  Duration of Psychotic Symptoms:Greater than six months  Hallucinations:Hallucinations: Visual Description of Visual Hallucinations: Patient reports that she sees her dead fiance in the of their home.  Ideas of Reference:None  Suicidal Thoughts:Suicidal Thoughts: No SI Passive Intent and/or Plan: Without Intent; Without Plan; Without Means to Carry Out; Without Access to Means  Homicidal Thoughts:Homicidal Thoughts: No  Sensorium  Memory:Immediate Good; Recent Fair; Remote Fair  Judgment:Fair  Insight:Fair  Executive Functions  Concentration:Fair  Attention Span:Fair  [place]  Language:Good  Psychomotor Activity  Psychomotor Activity:Psychomotor Activity: Normal  Assets  Assets:Communication Skills; Desire for Improvement; Financial Resources/Insurance; Housing; Resilience; Social Support  Sleep  Sleep:Sleep: Good Number of Hours of Sleep: 6.75  Physical Exam: Physical Exam Vitals and nursing note reviewed.  HENT:     Nose: Nose normal.     Mouth/Throat:     Pharynx: Oropharynx is clear.  Eyes:     Pupils: Pupils are equal, round, and reactive to light.  Cardiovascular:     Rate and Rhythm: Normal rate.     Pulses: Normal pulses.  Pulmonary:     Effort: Pulmonary effort is normal.     Comments: Hx. COPD Genitourinary:    Comments: Deferred Musculoskeletal:        General: Normal range of motion.     Cervical back: Normal range of motion.  Skin:    General: Skin is warm and dry.  Neurological:     General: No focal deficit present.     Mental Status: She is alert and oriented to person, place, and time.   Review of Systems  Constitutional:  Negative for chills and fever.  HENT:  Negative for congestion and sore throat.   Eyes:  Negative for blurred vision.   Respiratory:  Negative for cough, shortness of breath and wheezing.   Cardiovascular:  Negative for chest pain and palpitations.  Gastrointestinal:  Negative for abdominal pain, constipation, diarrhea, heartburn, nausea and vomiting.  Genitourinary:  Negative for dysuria and urgency.  Musculoskeletal:  Negative for joint pain and myalgias.  Neurological:  Negative for dizziness, tingling, tremors, sensory change, speech change, focal weakness, seizures, loss of consciousness, weakness and headaches.  Endo/Heme/Allergies:        Toradol. Buspirone  Lyrica Gabapentin Tramadol Zofran  Penicillins       Psychiatric/Behavioral:  Positive for depression ("Improving"). Negative for hallucinations (Hx. visual hallucinations.), memory loss, substance abuse and suicidal ideas. The patient is not nervous/anxious and does not have insomnia.   Blood pressure 116/83, pulse 96, temperature 98.5 F (36.9 C), temperature source Oral, resp. rate 18, height 5\' 2"  (1.575 m), weight 91.6 kg, last menstrual period 09/12/2015, SpO2 96 %. Body mass index is 36.95 kg/m.  Treatment Plan Summary: Daily contact with patient to assess and evaluate symptoms and progress in treatment and Medication management.   Continue inpatient hospitalization. Will continue today 07/28/2021 plan as below except where it is noted.   Diagnoses: Schizoaffective disorder, bipolar-type/r/o Schizophrenia/bipolar.  PTSD.    Other medical conditions.  COPD CHF   2. Medication management to reduce current symptoms to base line and improve the patient's overall level of functioning: See [facility] for plan of care.   Major depressive disorder with psychosis, R/o bipolar disorder/Schizophrenia.  Increased Abilify from 5 mg to Abilify 7.5 mg po daily (starting 06-28-21).Marland Kitchen    Anxiety.  Continue Hydroxyzine 25 mg po tid prn.   Insomnia.  Continue Melatonin 5 mg po Q hs.   3. Other medical conditions/ailments, continue:  Albuterol  inhaler 2 puff Q 6 hrs prn for SOB. Albuterol nebulizing treatment 2.5 mg QQ 6 hrs prn for wheezing/SOB. Cipro 500 mg po bid x 7 days for uti. Estradiol 2 mg po daily for estrogen replacement. Furosemide 20 mg po daily for hx of CHF.Nicotine patch 21 mg topically Q 24 hrs for nicotine  withdrawal. NTG 0.4 mg sublingually Q 5 minutes prn for chest pain. Protonix 40 mg po Q am for GERD. Miralax 17 g twice daily prn for constipation. Kdur 20 meq po bid for potassium replacement.    Continue to encourage group attendance/participation. Discharge disposition in progress.  Lindell Spar, NP, pmhnp, fnp-bc 07/28/2021, 10:14 AM

## 2021-07-28 NOTE — Progress Notes (Signed)
D:  Patient's self inventory sheet, patient sleeps good, sleep medication helpful.  Good appetite, low energy level, good concentration.  Rated depression 9, hopeless and anxiety 10.  Denied withdrawals, checked cramping, nausea, runny nose.  Denied SI.  Then checked sometimes, contracts for safety.  Denied SI on self inventory form.  Physical problems, pain, headaches, blurred vision.  Physical pain, pain medicine helpful.  Worst pain #9 in past 24 hours.  Goal is friend and staff.  Love my family.  Plans to be kind to staff and friends.  Need to discharge on Friday.   A:  Medications administered per MD orders.  Emotional support and encouragement given patient. R:  Denied SI and HI, contracts for safety.  Denied A/V hallucinations.  Safety maintained with 15 minute checks.

## 2021-07-28 NOTE — BHH Suicide Risk Assessment (Signed)
[place] INPATIENT:  Family/Significant Other Suicide Prevention Education  Suicide Prevention Education:  Education Completed; Broadus John, son, [phone]  (name of family member/significant other) has been identified by the patient as the family member/significant other with whom the patient will be residing, and identified as the person(s) who will aid the patient in the event of a mental health crisis (suicidal ideations/suicide attempt).  With written consent from the patient, the family member/significant other has been provided the following suicide prevention education, prior to the and/or following the discharge of the patient.  CSW spoke with patient son who reports that patient has a history of mental illness and that she has been stressed with her current housing situation.  Son reports that he is assisting her in finding low income housing to assist with current conflict in housing situation.  Son is concerned with patient wandering streets of [place] but has no additional safety concerns.  Son reports no guns/weapons in the house at this time.  Son reports that he works at [employer] but that if he knows ahead of time and she can be discharged in the morning he can pick her up at discharge.  Patient welcome to return home to current living situation.   The suicide prevention education provided includes the following: Suicide risk factors Suicide prevention and interventions National Suicide Hotline telephone number [facility] assessment telephone number [facility] Emergency Assistance [place] and/or Residential Mobile Crisis Unit telephone number  Request made of family/significant other to: Remove weapons (e.g., guns, rifles, knives), all items previously/currently identified as safety concern.   Remove drugs/medications (over-the-counter, prescriptions, illicit drugs), all items previously/currently identified as a safety concern.  The family member/significant  other verbalizes understanding of the suicide prevention education information provided.  The family member/significant other agrees to remove the items of safety concern listed above.  Vanilla [place]ington E Zaneta Lightcap 07/28/2021, 1:40 PM

## 2021-07-28 NOTE — Group Note (Signed)
Date:  07/28/2021 Time:  3:42 PM  Group Topic/Focus:  Emotional Education:   The focus of this group is to discuss what feelings/emotions are, and how they are experienced.    Participation Level:  Active  Participation Quality:  Appropriate  Affect:  Appropriate  Cognitive:  Appropriate Additional Comments:  Patient stated her mood was elevated as a result of the  temporary tattoo experience.  Jerrye Beavers 07/28/2021, 3:42 PM

## 2021-07-28 NOTE — Progress Notes (Signed)
   07/28/21 2121  Psych Admission Type (Psych Patients Only)  Admission Status Voluntary  Psychosocial Assessment  Patient Complaints Anxiety;Depression;Sadness  Eye Contact Fair  Facial Expression Anxious;Sad  Affect Appropriate to circumstance  Speech Logical/coherent;Tangential  Interaction Assertive;Attention-seeking;Needy  Motor Activity Slow;Unsteady  Appearance/Hygiene Improved  Behavior Characteristics Cooperative;Appropriate to situation  Mood Depressed;Anxious  Thought Process  Coherency WDL  Content WDL  Delusions None reported or observed  Perception WDL  Hallucination None reported or observed  Judgment Impaired  Confusion None  Danger to Self  Current suicidal ideation? Denies  Danger to Others  Danger to Others None reported or observed

## 2021-07-28 NOTE — Plan of Care (Signed)
Nurse discussed anxiety, depression and coping skills with patient.  

## 2021-07-28 NOTE — Group Note (Signed)
Date:  07/28/2021 Time:  9:30 AM  Group Topic/Focus:  Orientation:   The focus of this group is to educate the patient on the purpose and policies of crisis stabilization and provide a format to answer questions about their admission.  The group details unit policies and expectations of patients while admitted.    Participation Level:  Active  Participation Quality:  Appropriate  Affect:  Appropriate  Cognitive:  Appropriate  Insight: Appropriate  Engagement in Group:  Engaged  Modes of Intervention:  Discussion  Additional Comments:  Pt wants to stay calm, talk to social worker and stay focused. (Group Facilitated by Anne Ng MHTNS)  Garvin Fila 07/28/2021, 9:30 AM

## 2021-07-28 NOTE — Group Note (Signed)
Date:  07/28/2021 Time:  4:55 PM  Group Topic/Focus:  Self Care:   The focus of this group is to help patients understand the importance of self-care in order to improve or restore emotional, physical, spiritual, interpersonal, and financial health.    Participation Level:  Active  Participation Quality:  Appropriate  Affect:  Appropriate  Cognitive:  Appropriate  Insight: Appropriate  Engagement in Group:  Engaged  Modes of Intervention:  Education  Additional Comments:  Patient educated on ways to to cope with basic life issues and why it is important to be able to do so for a sense of wellbeing.  Jerrye Beavers 07/28/2021, 4:55 PM

## 2021-07-28 NOTE — Group Note (Signed)
Date:  07/28/2021 Time:  11:42 AM  Group Topic/Focus:  Orientation:   The focus of this group is to educate the patient on the purpose and policies of crisis stabilization and provide a format to answer questions about their admission.  The group details unit policies and expectations of patients while admitted.    Participation Level:  Active  Participation Quality:  Appropriate  Affect:  Appropriate  Cognitive:  Appropriate  Insight: Appropriate  Engagement in Group:  Engaged  Modes of Intervention:  Discussion  Additional Comments:  Patient had questions about her care plan and discharge.  Jerrye Beavers 07/28/2021, 11:42 AM

## 2021-07-28 NOTE — BHH Group Notes (Signed)
[facility] Group Notes:  (Nursing/MT/Case Management/Adjunct)  Date:  07/28/2021  Time:  8:40 PM  Type of Therapy:  Psychoeducational Skills  Participation Level:  Active  Participation Quality:  Appropriate  Affect:  Blunted  Cognitive:  Disorganized  Insight:  Improving  Engagement in Group:  Developing/Improving  Modes of Intervention:  Education  Summary of Progress/Problems: The patient's positive event for the day is that she met some new people. She then went on a tangent about her recent loss of her husband.   Archie Balboa S 07/28/2021, 8:40 PM

## 2021-07-29 DIAGNOSIS — F315 Bipolar disorder, current episode depressed, severe, with psychotic features: Secondary | ICD-10-CM

## 2021-07-29 LAB — T3, FREE: T3, Free: 3.4 pg/mL (ref 2.0–4.4)

## 2021-07-29 LAB — POTASSIUM: Potassium: 4.1 mmol/L (ref 3.5–5.1)

## 2021-07-29 MED ORDER — ARIPIPRAZOLE 10 MG PO TABS
10.0000 mg | ORAL_TABLET | Freq: Every day | ORAL | Status: DC
  Administered 2021-07-30 – 2021-08-01 (×3): 10 mg via ORAL
  Filled 2021-07-29 (×5): qty 1

## 2021-07-29 NOTE — Progress Notes (Signed)
Progress note    07/29/21 0800  Psych Admission Type (Psych Patients Only)  Admission Status Voluntary  Psychosocial Assessment  Patient Complaints Anxiety  Eye Contact Fair  Facial Expression Anxious  Affect Anxious;Appropriate to circumstance  Speech Logical/coherent  Interaction Assertive  Motor Activity Slow;Unsteady  Appearance/Hygiene Unremarkable  Behavior Characteristics Cooperative;Appropriate to situation;Anxious  Mood Anxious;Pleasant  Thought Process  Coherency Concrete thinking  Content Confabulation;Obsessions  Delusions Somatic  Perception WDL  Hallucination None reported or observed  Judgment Poor  Confusion None  Danger to Self  Current suicidal ideation? Denies  Danger to Others  Danger to Others None reported or observed

## 2021-07-29 NOTE — BHH Group Notes (Signed)
Adult Psychoeducational Group Note  Date:  07/29/2021 Time:  3:23 PM  Group Topic/Focus:  Goals Group:   The focus of this group is to help patients establish daily goals to achieve during treatment and discuss how the patient can incorporate goal setting into their daily lives to aide in recovery.  Participation Level:  Active  Participation Quality:  Appropriate  Affect:  Appropriate  Cognitive:  Appropriate  Insight: Appropriate  Engagement in Group:  Engaged  Modes of Intervention:  Discussion  Additional Comments:  Patient attended morning orientation/goal group and participated.  Laetitia Schnepf W Raynald Rouillard 72/90/2111, 3:23 PM

## 2021-07-29 NOTE — BHH Group Notes (Addendum)
Patient provided worksheets to patient due to Covid. Tech also offered to meet individually with patient, if needed.

## 2021-07-29 NOTE — Progress Notes (Signed)
CSW sent a fax to the [place] criminal court to assist with moving patient court date on Monday, 08/01/2021, since patient will be getting treatment in the hospital.  Fax successful.   Fax number: [place], LCSW, [place] Worker  [facility]

## 2021-07-29 NOTE — Progress Notes (Signed)
Adult Psychoeducational Group Note  Date:  07/29/2021 Time:  9:42 PM  Group Topic/Focus:  Wrap-Up Group:   The focus of this group is to help patients review their daily goal of treatment and discuss progress on daily workbooks.  Participation Level:  Active  Participation Quality:  Appropriate  Affect:  Appropriate  Cognitive:  Appropriate  Insight: Appropriate  Engagement in Group:  Engaged  Modes of Intervention:  Discussion  Additional Comments:  patient said her day was a 54. Her goal plan to go home. She achieved her goal. Coping skills love herself.Quentin Cornwall, Alexius Hangartner Long 07/29/2021, 9:42 PM

## 2021-07-29 NOTE — Progress Notes (Addendum)
[facility] MD Progress Note  07/29/2021 1:11 PM DOLCE SYLVIA  MRN:  [id number]  Subjective: Iza reports, "My mood is great today. It is actually wonderful. I'm ready to go home now. The doctor told me that few minutes ago that I will be going ho home tomorrow. I talked to my daughter this morning, she told me that she loves me. That made me feel good because she has not spoken to me in 5 years. I did not sleep too well last night. I woke up at 3:00 pm last night, took a shower & wash my hair". Daily notes: Eilene is seen, chart reviewed. The chart findings discussed with the treatment team. She presents alert, oriented & aware of situation. She is visible on the unit, attending group sessions. During this follow-up care evaluation, patient presents reporting improved mood mood & feeling wonderful. She presents with a good affect, good eye contact. She remains pleasantly disorganized & tangential, mildly hypomanic. She states that she is ready to be discharged from the hospital as the attending psychiatrist had informed her earlier prior to this evaluation that she will be going home tomorrow.  She also states that her son had called to inform her that she will be discharged today as someone from this hospital called to inform him as such. Patient is informed that it is possible that there was a miscommunication between her & her son as no one from this hospital called to provide her son with information because her discharge date has not been set as of yet. This is because she still needs some more time in the hospital for her symptoms to subside as we are still titrating her medications. Also since her admission, patient has been telling the staff that she does have chemotherapy appointment/treatment this coming Tuesday, a scheduled open heart surgery on the same Tuesday & a court date on Monday the day prior. Again, this was straightened by the attending psychiatrist today. Patient at this time is saying  that what she had scheduled for Tuesday were mainly doctors' appointments which includes cardiology/oncology appointments. She currently denies any symptoms of depression or anxiety. She denies any SIHI, AVH, delusional thoughts or paranoia. She does not appear to be responding to any internal stimuli. We increased her Abilify to 10 mg daily starting tomorrow morning. She denies any side effects. She slept for about 5 hours last night per documentation. We will continue to monitor her mood symptoms. Reviewed vital signs (stable). Reviewed current lab reports , T4 0.83 (wnl), T3 3.4 (wnl) . We will continue current plan of care as already in progress. Patient at this time is in no apparent distress.  Objective: 50 year old Caucasian female with prior hx of mental illness/psychiatric admissions. She has in the past been admitted to the [facility] psychiatric ward in [place] just of recent, at the [facility]. She was receiving mental health care on an outpatient basis at the [facility] in [facility] with Dr. Sheralyn Boatman. She is currently admitted to the [facility] from the [facility] with complain of worsening suicidal ideation with plan & an attempt to cut her wrists. Patient also admitted to the [facility] staff that she was also seeing her dead fiance in her kitchen.  Principal Problem: Bipolar I disorder, current or most recent episode depressed, with psychotic features ([place])  Diagnosis: Principal Problem:   Bipolar I disorder, current or most recent episode depressed, with psychotic features ([place]) Active Problems:   Diastolic  CHF ([place])   COPD (chronic obstructive pulmonary disease) (HCC)   Tobacco user   PTSD (post-traumatic stress disorder)  Total Time spent with patient:  25 minutes  Past Psychiatric History: Major depressive disorder, PTSD  Past Medical History:  Past Medical History:  Diagnosis Date   Anxiety    Arthritis    Rheumatoid   Asthma    Breast cancer ([place])    CHF  (congestive heart failure) (HCC)    Chronic back pain    Colon cancer ([place])    COPD (chronic obstructive pulmonary disease) ([place])    Depression    Fibromyalgia    Gout    Heart attack ([place]) 2017   Renal disorder    Right lumbar radiculopathy    Vitiligo    face    Past Surgical History:  Procedure Laterality Date   CESAREAN SECTION     DILITATION & CURRETTAGE/HYSTROSCOPY WITH NOVASURE ABLATION N/A 07/07/2015   Procedure: HYSTEROSCOPY, UTERINE CURETTAGE, ENDOMETRIAL  ABLATION Uterine Cavity Length=6.5cm Uterine Cavity Width=4.5cm Power=161 Watts Time=1 minute 19 seconds;  Surgeon: Florian Buff, MD;  Location: [facility];  Service: Gynecology;  Laterality: N/A;   POLYPECTOMY  07/07/2015   Procedure: POLYPECTOMY;  Surgeon: Florian Buff, MD;  Location: [facility];  Service: Gynecology;;   SALPINGOOPHORECTOMY Bilateral 10/13/2015   Procedure: BILATERAL SALPINGO OOPHORECTOMY;  Surgeon: Florian Buff, MD;  Location: [facility];  Service: Gynecology;  Laterality: Bilateral;   TUBAL LIGATION     VAGINAL HYSTERECTOMY N/A 10/13/2015   Procedure: HYSTERECTOMY VAGINAL;  Surgeon: Florian Buff, MD;  Location: [facility];  Service: Gynecology;  Laterality: N/A;   Family History:  Family History  Problem Relation Age of Onset   Diabetes Mother    Congestive Heart Failure Mother    Depression Father    Hypertension Father    Cancer Father    Heart disease Maternal Uncle    Depression Maternal Grandmother    Family Psychiatric  History: See H&P.  Social History:  Social History   Substance and Sexual Activity  Alcohol Use No   Alcohol/week: 0.0 standard drinks     Social History   Substance and Sexual Activity  Drug Use No   Comment: Hx of marijuana use - None now    Social History   Socioeconomic History   Marital status: Divorced    Spouse name: Not on file   Number of children: 2   Years of education: 11   Highest education level: 11th grade  Occupational History   Not on file  Tobacco  Use   Smoking status: Never    Passive exposure: Never   Smokeless tobacco: Never   Tobacco comments:    Patient denied use of tobacco  Substance and Sexual Activity   Alcohol use: No    Alcohol/week: 0.0 standard drinks   Drug use: No    Comment: Hx of marijuana use - None now   Sexual activity: Not Currently    Birth control/protection: Surgical  Other Topics Concern   Not on file  Social History Narrative   Lives with daughter in [place], [place].  Denied use of tobacco, alcohol, all drug use.   Social Determinants of Health   Financial Resource Strain: Not on file  Food Insecurity: Not on file  Transportation Needs: Not on file  Physical Activity: Not on file  Stress: Not on file  Social Connections: Not on file   Additional Social History:   Sleep: Good  Appetite:  Good  Current Medications: Current Facility-Administered Medications  Medication Dose Route Frequency Provider Last Rate Last Admin   acetaminophen (TYLENOL) tablet 650 mg  650 mg Oral Q6H PRN Chalmers Guest, NP   650 mg at 07/29/21 1145   albuterol (PROVENTIL) (2.5 MG/3ML) 0.083% nebulizer solution 2.5 mg  2.5 mg Nebulization Q6H PRN Sava Proby, Herbert Pun I, NP       albuterol (VENTOLIN HFA) 108 (90 Base) MCG/ACT inhaler 2 puff  2 puff Inhalation Q6H PRN Lindell Spar I, NP       alum & mag hydroxide-simeth (MAALOX/MYLANTA) 200-200-20 MG/5ML suspension 30 mL  30 mL Oral Q4H PRN Chalmers Guest, NP       ARIPiprazole (ABILIFY) tablet 7.5 mg  7.5 mg Oral Daily Byrne Capek I, NP   7.5 mg at 07/29/21 0801   ciprofloxacin (CIPRO) tablet 500 mg  500 mg Oral BID Lindell Spar I, NP   500 mg at 07/29/21 0801   estradiol (ESTRACE) tablet 2 mg  2 mg Oral Daily Chalmers Guest, NP   2 mg at 07/29/21 0801   furosemide (LASIX) tablet 20 mg  20 mg Oral Daily Chalmers Guest, NP   20 mg at 07/29/21 0801   hydrOXYzine (ATARAX/VISTARIL) tablet 25 mg  25 mg Oral TID PRN Chalmers Guest, NP   25 mg at 07/28/21 0732   magnesium hydroxide  (MILK OF MAGNESIA) suspension 30 mL  30 mL Oral Daily PRN Chalmers Guest, NP       melatonin tablet 5 mg  5 mg Oral QHS Massengill, Ovid Curd, MD   5 mg at 07/28/21 2121   nicotine (NICODERM CQ - dosed in mg/24 hours) patch 21 mg  21 mg Transdermal Q0600 Chalmers Guest, NP   21 mg at 07/29/21 0802   nitroGLYCERIN (NITROSTAT) SL tablet 0.4 mg  0.4 mg Sublingual Q5 min PRN Chalmers Guest, NP       pantoprazole (PROTONIX) EC tablet 40 mg  40 mg Oral Daily Chalmers Guest, NP   40 mg at 07/29/21 0801   polyethylene glycol (MIRALAX / GLYCOLAX) packet 17 g  17 g Oral BID PRN Chalmers Guest, NP       potassium chloride SA (KLOR-CON) CR tablet 20 mEq  20 mEq Oral BID Chalmers Guest, NP   20 mEq at 07/29/21 6270   Lab Results:  Results for orders placed or performed during the hospital encounter of 07/26/21 (from the past 48 hour(s))  T3, free     Status: None   Collection Time: 07/27/21  6:34 PM  Result Value Ref Range   T3, Free 3.4 2.0 - 4.4 pg/mL    Comment: (NOTE) Performed At: [facility] [place], [place] [id number] Rush Farmer MD EX:[id number]   T4, free     Status: None   Collection Time: 07/27/21  6:34 PM  Result Value Ref Range   Free T4 0.83 0.61 - 1.12 ng/dL    Comment: (NOTE) Biotin ingestion may interfere with free T4 tests. If the results are inconsistent with the TSH level, previous test results, or the clinical presentation, then consider biotin interference. If needed, order repeat testing after stopping biotin. Performed at [facility], [place] [address] [place], [place] [address]   Potassium     Status: None   Collection Time: 07/28/21  6:21 AM  Result Value Ref Range   Potassium 4.3 3.5 - 5.1 mmol/L    Comment: Performed  at [facility], [place] [address] [place], [place] [address]  Potassium     Status: None   Collection Time: 07/29/21  6:44 AM  Result Value Ref Range   Potassium 4.1 3.5 - 5.1 mmol/L    Comment:  Performed at [facility], [place] [address] [place], [place] [address]   Blood Alcohol level:  Lab Results  Component Value Date   ETH <10 83/38/2505   Metabolic Disorder Labs: Lab Results  Component Value Date   HGBA1C 5.4 07/27/2021   MPG 108.28 07/27/2021   No results found for: PROLACTIN Lab Results  Component Value Date   CHOL 177 07/27/2021   TRIG 172 (H) 07/27/2021   HDL 45 07/27/2021   CHOLHDL 3.9 07/27/2021   VLDL 34 07/27/2021   LDLCALC 98 07/27/2021   LDLCALC 124 (H) 12/31/2015   Physical Findings: AIMS: Facial and Oral Movements Muscles of Facial Expression: None, normal Lips and Perioral Area: None, normal Jaw: None, normal Tongue: None, normal,Extremity Movements Upper (arms, wrists, hands, fingers): None, normal Lower (legs, knees, ankles, toes): None, normal, Trunk Movements Neck, shoulders, hips: None, normal, Overall Severity Severity of abnormal movements (highest score from questions above): None, normal Incapacitation due to abnormal movements: None, normal Patient's awareness of abnormal movements (rate only patient's report): No Awareness, Dental Status Current problems with teeth and/or dentures?: No Does patient usually wear dentures?: No  CIWA:    COWS:     Musculoskeletal: Strength & Muscle Tone: within normal limits Gait & Station: normal Patient leans: N/A  Psychiatric Specialty Exam:  Presentation  General Appearance: Appropriate for Environment; Casual; Fairly Groomed  Eye Contact:Good  Speech:Clear and Coherent (slightly pressured)  Speech Volume:Increased  Handedness:Right  Mood and Affect  Mood:Euphoric  Affect:Appropriate; Congruent  Thought Process  Thought Processes:Coherent; Disorganized  Descriptions of Associations:Tangential  Orientation:Full (Time, Place and Person)  Thought Content:Tangential; Rumination  History of Schizophrenia/Schizoaffective disorder:Yes  Duration of Psychotic  Symptoms:Greater than six months  Hallucinations:Hallucinations: None Description of Visual Hallucinations: NA  Ideas of Reference:Delusions  Suicidal Thoughts:Suicidal Thoughts: No SI Passive Intent and/or Plan: Without Intent; Without Plan; Without Means to Carry Out; Without Access to Means  Homicidal Thoughts:Homicidal Thoughts: No  Sensorium  Memory:Immediate Fair; Recent Fair; Remote Poor  Judgment:Fair  Insight:Fair  Executive Functions  Concentration:Fair  Attention Span:Fair  [place] of Knowledge:Poor  Language:Good  Psychomotor Activity  Psychomotor Activity:Psychomotor Activity: Normal  Assets  Assets:Communication Skills; Desire for Improvement; Financial Resources/Insurance; Housing; Resilience; Social Support  Sleep  Sleep:Sleep: Fair Number of Hours of Sleep: 5  Physical Exam: Physical Exam Vitals and nursing note reviewed.  HENT:     Nose: Nose normal.     Mouth/Throat:     Pharynx: Oropharynx is clear.  Eyes:     Pupils: Pupils are equal, round, and reactive to light.  Cardiovascular:     Rate and Rhythm: Normal rate.     Pulses: Normal pulses.  Pulmonary:     Effort: Pulmonary effort is normal.     Comments: Hx. COPD Genitourinary:    Comments: Deferred Musculoskeletal:        General: Normal range of motion.     Cervical back: Normal range of motion.  Skin:    General: Skin is warm and dry.  Neurological:     General: No focal deficit present.     Mental Status: She is alert and oriented to person, place, and time.   Review of Systems  Constitutional:  Negative for chills and fever.  HENT:  Negative for congestion and sore throat.   Eyes:  Negative for blurred vision.  Respiratory:  Negative for cough, shortness of breath and wheezing.   Cardiovascular:  Negative for chest pain and palpitations.  Gastrointestinal:  Negative for abdominal pain, constipation, diarrhea, heartburn, nausea and vomiting.  Genitourinary:   Negative for dysuria and urgency.  Musculoskeletal:  Negative for joint pain and myalgias.  Neurological:  Negative for dizziness, tingling, tremors, sensory change, speech change, focal weakness, seizures, loss of consciousness, weakness and headaches.  Endo/Heme/Allergies:        Toradol. Buspirone  Lyrica Gabapentin Tramadol Zofran  Penicillins       Psychiatric/Behavioral:  Positive for depression ("Improving"). Negative for hallucinations (Hx. visual hallucinations.), memory loss, substance abuse and suicidal ideas. The patient is not nervous/anxious and does not have insomnia.   Blood pressure (!) 116/57, pulse 99, temperature 97.7 F (36.5 C), temperature source Oral, resp. rate 18, height 5\' 2"  (1.575 m), weight 91.6 kg, last menstrual period 09/12/2015, SpO2 100 %. Body mass index is 36.95 kg/m.  Treatment Plan Summary: Daily contact with patient to assess and evaluate symptoms and progress in treatment and Medication management.   Continue inpatient hospitalization. Will continue today 07/29/2021 plan as below except where it is noted.   Diagnoses: Schizoaffective disorder, bipolar-type/r/o Schizophrenia/bipolar.  PTSD.    Other medical conditions.  COPD CHF   2. Medication management to reduce current symptoms to base line and improve the patient's overall level of functioning: See [facility] for plan of care.   Major depressive disorder with psychosis, R/o bipolar disorder/Schizophrenia.  Increased Abilify to 10 mg po daily.    Anxiety.  Continue Hydroxyzine 25 mg po tid prn.   Insomnia.  Continue Melatonin 5 mg po Q hs.   3. Other medical conditions/ailments, continue:  Albuterol inhaler 2 puff Q 6 hrs prn for SOB. Albuterol nebulizing treatment 2.5 mg QQ 6 hrs prn for wheezing/SOB. Cipro 500 mg po bid x 7 days for uti. Estradiol 2 mg po daily for estrogen replacement. Furosemide 20 mg po daily for hx of CHF.Nicotine patch 21 mg topically Q 24 hrs for nicotine  withdrawal. NTG 0.4 mg sublingually Q 5 minutes prn for chest pain. Protonix 40 mg po Q am for GERD. Miralax 17 g twice daily prn for constipation. Kdur 20 meq po bid for potassium replacement.    Continue to encourage group attendance/participation. Discharge disposition in progress.  Lindell Spar, NP, pmhnp, fnp-bc 07/29/2021, 1:11 PM Patient ID: [name], female   DOB: 04/22/1971, 50 y.o.   MRN: [id number]

## 2021-07-29 NOTE — Group Note (Signed)
Recreation Therapy Group Note   Group Topic:Stress Management  Group Date: 07/29/2021 Start Time: 0930 End Time: 0945 Facilitators: Victorino Sparrow, [place] Location: [address] Dayroom   Goal Area(s) Addresses:  Patient will identify positive stress management techniques. Patient will identify benefits of using stress management post d/c.  Group Description:  Meditation.  LRT played a meditation that focused on setting personal boundaries putting, saying to others can be a way of saying yes to yourself and putting yourself first as a form of self care.  Patients were to listen and follow along as meditation played to fully engage.    Affect/Mood: Appropriate   Participation Level: Engaged   Participation Quality: Independent   Behavior: Appropriate   Speech/Thought Process: Focused   Insight: Good   Judgement: Good   Modes of Intervention: Meditation   Patient Response to Interventions:  Engaged   Education Outcome:  Acknowledges education and In group clarification offered    Clinical Observations/Individualized Feedback: Pt attended and participated in group session.     Plan: Continue to engage patient in RT group sessions 2-3x/week.   Victorino Sparrow, LRT,CTRS 07/29/2021 10:47 AM

## 2021-07-29 NOTE — Progress Notes (Signed)
   07/29/21 2312  Psych Admission Type (Psych Patients Only)  Admission Status Voluntary  Psychosocial Assessment  Patient Complaints Anxiety  Eye Contact Fair  Facial Expression Anxious  Affect Appropriate to circumstance  Speech Logical/coherent  Interaction Assertive  Motor Activity Slow  Appearance/Hygiene Unremarkable  Behavior Characteristics Appropriate to situation  Mood Depressed;Pleasant  Thought Process  Coherency WDL  Content WDL  Delusions None reported or observed  Perception WDL  Hallucination None reported or observed  Judgment Poor  Confusion None  Danger to Self  Current suicidal ideation? Denies  Danger to Others  Danger to Others None reported or observed

## 2021-07-29 NOTE — Group Note (Signed)
LCSW Group Therapy Note   Group Date: 07/29/2021 Start Time: 1300 End Time: 1400  Type of Therapy:  Group Therapy   Topic of Therapy:  Gratitude   Participation Level:  Active   Due to the COVID-19 pandemic, this group has been supplemented with worksheets.   Summary of Progress/Problems: CSW provided worksheet to patient due to COVID. CSW offered to meet individually with patient as needed.  Kanen Mottola M Kyair Ditommaso, LCSWA 07/29/2021  1:10 PM    
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
## 2021-07-29 NOTE — Plan of Care (Signed)
  Problem: Education: Goal: Knowledge of [facility] Education information/materials will improve Outcome: Progressing Goal: Emotional status will improve Outcome: Progressing Goal: Mental status will improve Outcome: Progressing   

## 2021-07-29 NOTE — BHH Group Notes (Signed)
Adult Psychoeducational Group Note  Date:  07/29/2021 Time:  3:10 PM  Group Topic/Focus:  Goals Group:   The focus of this group is to help patients establish daily goals to achieve during treatment and discuss how the patient can incorporate goal setting into their daily lives to aide in recovery.  Participation Level:  Active  Participation Quality:  Appropriate  Affect:  Appropriate  Cognitive:  Appropriate  Insight: Appropriate  Engagement in Group:  Engaged  Modes of Intervention:  Discussion  Additional Comments:  Patient attended morning orientation/goal group participated.  Anureet Bruington W Amaal Dimartino 75/44/9201, 3:10 PM

## 2021-07-30 NOTE — Progress Notes (Addendum)
   07/30/21 1400  Psych Admission Type (Psych Patients Only)  Admission Status Voluntary  Psychosocial Assessment  Patient Complaints Anxiety;Depression  Eye Contact Fair  Facial Expression Anxious  Affect Appropriate to circumstance  Speech Logical/coherent  Interaction Assertive  Motor Activity Slow  Appearance/Hygiene Unremarkable  Behavior Characteristics Cooperative;Appropriate to situation  Mood Depressed;Anxious  Aggressive Behavior  Effect No apparent injury  Thought Process  Coherency WDL  Content WDL  Delusions None reported or observed  Perception WDL  Hallucination None reported or observed  Judgment Poor  Confusion None  Danger to Self  Current suicidal ideation? Passive  Danger to Others  Danger to Others None reported or observed  D. Pt presents with a sad affect/ depressed mood- rated her depression, hopelessness and anxiety all 10's on her self inventory.Pt has been visible in the milieu observed interacting appropriately with peers and attending groups.  Pt endorsed that she intermittently had passive SI (without a plan), and contracts for safety. A. Labs and vitals monitored. Pt given and educated on medications. Pt supported emotionally and encouraged to express concerns and ask questions.   R. Pt remains safe with 15 minute checks. Will continue POC.

## 2021-07-30 NOTE — [facility] Group Notes (Signed)
.  Psychoeducational Group Note  Date 07/30/2021 Time: 0900-1000    Goal Setting   Purpose of Group: Group Focus: affirmation, clarity of thought, and goals/reality orientation Treatment Modality:  Psychoeducation Interventions utilized were assignment, group exercise, and support  Purpose: To be able to understand and verbalize the reason for their admission to the hospital. To understand that the medication helps with their chemical imbalance but they also need to work on their choices in life. To be challenged to develop a list of 30 positives about themselves. Also introduce the concept that "feelings" are not reality.    Participation Level:  Active  Participation Quality:  Appropriate  Affect:  Appropriate  Cognitive:  Appropriate  Insight:  Improving  Engagement in Group:  Engaged  Additional Comments: Rates her energy at a 10/10. Shared that her fiance' took his own life in front of her.  Paulino Rily

## 2021-07-30 NOTE — Progress Notes (Signed)
Adult Psychoeducational Group Note  Date:  07/30/2021 Time:  9:39 PM  Group Topic/Focus:  Wrap-Up Group:   The focus of this group is to help patients review their daily goal of treatment and discuss progress on daily workbooks.  Participation Level:  Active  Participation Quality:  Appropriate  Affect:  Appropriate  Cognitive:  Appropriate  Insight: Appropriate  Engagement in Group:  Engaged  Modes of Intervention:  Discussion  Additional Comments:  Pt stated her goal for today was to focus on her treatment plan. Pt stated she accomplished her goal today. Pt stated she talked with her doctor and her social worker about her care today. Pt rated her overall day a 10. Pt stated she was able to contact her family friend today which improved her overall day. Pt stated she felt better about herself tonight. Pt stated she was able to attend all groups held today. Pt stated she took all medications provided today. Pt stated her appetite was pretty good today. Pt rated her sleep last night was fair. Pt stated the goal tonight was to get some rest. Pt stated she had no physical pain tonight. Pt deny visual hallucinations and auditory issues tonight. Pt denies thoughts of harming herself or others. Pt stated she would alert staff if anything changed.  Candy Sledge 07/30/2021, 9:39 PM

## 2021-07-30 NOTE — [facility] Group Notes (Signed)
Adult Psychoeducational Group Note  Date:  07/30/2021 Time:  6:41 PM  Group Topic/Focus:  Goals Group:   The focus of this group is to help patients establish daily goals to achieve during treatment and discuss how the patient can incorporate goal setting into their daily lives to aide in recovery.  Participation Level:  Active  Participation Quality:  Appropriate  Affect:  Appropriate  Cognitive:  Appropriate  Insight: Appropriate  Engagement in Group:  Engaged  Modes of Intervention:  Discussion  Additional Comments:  Patient attended morning goal's group and participated.   Silvio Sausedo W Kaprice Kage 16/06/9603, 6:41 PM

## 2021-07-30 NOTE — [facility] Group Notes (Signed)
.  Psychoeducational Group Note  Date 07/30/2021 Time: 0900-1000    Goal Setting   Purpose of Group: Group Focus: affirmation, clarity of thought, and goals/reality orientation Treatment Modality:  Psychoeducation Interventions utilized were assignment, group exercise, and support  Purpose: To be able to understand and verbalize the reason for their admission to the hospital. To understand that the medication helps with their chemical imbalance but they also need to work on their choices in life. To be challenged to develop a list of 30 positives about themselves. Also introduce the concept that "feelings" are not reality.    Participation Level:  Active  Participation Quality:  Appropriate  Affect:  Appropriate  Cognitive:  Appropriate  Insight:  Improving  Engagement in Group:  Engaged  Additional Comments: Rates her energy at a 10. Active in the group  Bryson Dames A

## 2021-07-30 NOTE — Group Note (Signed)
LCSW Group Therapy Note  07/30/2021   10:00-11:00am   Type of Therapy and Topic:  Group Therapy: Anger Cues and Responses  Participation Level:  Active   Description of Group:   In this group, patients learned how to recognize the physical, cognitive, emotional, and behavioral responses they have to anger-provoking situations.  They identified a recent time they became angry and how they reacted.  They analyzed how their reaction was possibly beneficial and how it was possibly unhelpful.  The group discussed a variety of healthier coping skills that could help with such a situation in the future.  Focus was placed on how helpful it is to recognize the underlying emotions to our anger, because working on those can lead to a more permanent solution as well as our ability to focus on the important rather than the urgent.  Therapeutic Goals: Patients will remember their last incident of anger and how they felt emotionally and physically, what their thoughts were at the time, and how they behaved. Patients will identify how their behavior at that time worked for them, as well as how it worked against them. Patients will explore possible new behaviors to use in future anger situations. Patients will learn that anger itself is normal and cannot be eliminated, and that healthier reactions can assist with resolving conflict rather than worsening situations.  Summary of Patient Progress:  The patient shared that her most recent time of anger was prior to admission and said her daughter told her that she wished the patient would die.  She said they had not spoken for 5 years prior to that.  She ended up attempting suicide, said it was the 4th time she has attempted to take her life.  She talked at length about the death of her fiance and other deaths in her life.  She was monopolizing in group, often interjecting statements along the same lines about her daughter and her fiance at times when the topic was  completely different or when such statements were nonresponsive to the question asked.  She was not easily redirected, essentially had to be interrupted in order to get the group back on task.  Therapeutic Modalities:   Cognitive Behavioral Therapy  Maretta Los

## 2021-07-30 NOTE — Progress Notes (Signed)
   07/30/21 2315  Psych Admission Type (Psych Patients Only)  Admission Status Voluntary  Psychosocial Assessment  Patient Complaints Depression  Eye Contact Fair  Facial Expression Flat  Affect Appropriate to circumstance  Speech Logical/coherent  Interaction Assertive  Motor Activity Slow  Appearance/Hygiene Unremarkable  Behavior Characteristics Appropriate to situation  Mood Depressed;Pleasant  Thought Process  Coherency WDL  Content WDL  Delusions None reported or observed  Perception WDL  Hallucination None reported or observed  Judgment Poor  Confusion None  Danger to Self  Current suicidal ideation? Denies  Self-Injurious Behavior No self-injurious ideation or behavior indicators observed or expressed   Agreement Not to Harm Self Yes  Description of Agreement verbal  Danger to Others  Danger to Others None reported or observed

## 2021-07-30 NOTE — Progress Notes (Signed)
[facility] MD Progress Note  07/30/2021 4:09 PM Lynn Logan  MRN:  [id number]  Subjective: Lynn Logan reports, "I'm doing excellent. I'm hoping that I will go home today. The doctor told me yesterday that it is up to you to discharge me today. I have things to take care of at home. When I get out of here, I will be going to [employer], after that, I will be going to the [employer]. I'm also fixing to buy a house from this lady I know that will be going to the [employer] with me. I feel good. I have now learned to take care of me & appreciate myself. I have not learned to do that until now. I'm doing good".  Daily notes: Lynn Logan is seen, chart reviewed. The chart findings discussed with the treatment team. She presents alert, oriented & aware of situation. She is visible on the unit, attending group sessions. During this follow-up care evaluation, patient presents reporting improved mood mood & feeling excellent. She presents with a good affect, good eye contact. She remains pleasantly disorganized & tangential, mildly hypomanic. She states that she is ready to be discharged from the hospital as the attending psychiatrist had informed her yesterday that it is up to this provider to discharge her today. She is made aware that she will be eventually discharged once we know that she is doing well on all her medicines & her symptoms getting better. Also since her admission, patient has been telling the staff that she does have chemotherapy appointment/treatment this coming Tuesday, a scheduled open heart surgery on the same Tuesday & a court date on Monday the day prior. Again, this was straightened by the attending psychiatrist yesterday. She remains moderately grandiose. She denies any SIHI, AVH, delusional thoughts or paranoia. She does not appear to be responding to any internal stimuli.  She remains on Abilify to 10 mg daily starting this morning. She denies any side effects. She slept for about 3.75 hours  last night per documentation. We will continue to monitor her mood symptoms. Reviewed vital signs (stable). Reviewed current lab reports , T4 0.83 (wnl), T3 3.4 (wnl) . We will continue current plan of care as already in progress. Patient at this time is in no apparent distress.  Objective: 50 year old Caucasian female with prior hx of mental illness/psychiatric admissions. She has in the past been admitted to the [facility] psychiatric ward in [place] just of recent, at the [facility]. She was receiving mental health care on an outpatient basis at the [facility] in [facility] with Dr. Sheralyn Boatman. She is currently admitted to the [facility] from the [facility] with complain of worsening suicidal ideation with plan & an attempt to cut her wrists. Patient also admitted to the [facility] staff that she was also seeing her dead fiance in her kitchen.  Principal Problem: Bipolar I disorder, current or most recent episode depressed, with psychotic features ([place])  Diagnosis: Principal Problem:   Bipolar I disorder, current or most recent episode depressed, with psychotic features ([place]) Active Problems:   Diastolic CHF ([place])   COPD (chronic obstructive pulmonary disease) ([place])   Tobacco user   PTSD (post-traumatic stress disorder)  Total Time spent with patient:  25 minutes  Past Psychiatric History: Major depressive disorder, PTSD  Past Medical History:  Past Medical History:  Diagnosis Date   Anxiety    Arthritis    Rheumatoid   Asthma    Breast cancer (  HCC)    CHF (congestive heart failure) (HCC)    Chronic back pain    Colon cancer (HCC)    COPD (chronic obstructive pulmonary disease) (HCC)    Depression    Fibromyalgia    Gout    Heart attack ([place]) 2017   Renal disorder    Right lumbar radiculopathy    Vitiligo    face    Past Surgical History:  Procedure Laterality Date   CESAREAN SECTION     DILITATION & CURRETTAGE/HYSTROSCOPY WITH NOVASURE ABLATION N/A 07/07/2015    Procedure: HYSTEROSCOPY, UTERINE CURETTAGE, ENDOMETRIAL  ABLATION Uterine Cavity Length=6.5cm Uterine Cavity Width=4.5cm Power=161 Watts Time=1 minute 19 seconds;  Surgeon: Florian Buff, MD;  Location: [facility];  Service: Gynecology;  Laterality: N/A;   POLYPECTOMY  07/07/2015   Procedure: POLYPECTOMY;  Surgeon: Florian Buff, MD;  Location: [facility];  Service: Gynecology;;   SALPINGOOPHORECTOMY Bilateral 10/13/2015   Procedure: BILATERAL SALPINGO OOPHORECTOMY;  Surgeon: Florian Buff, MD;  Location: [facility];  Service: Gynecology;  Laterality: Bilateral;   TUBAL LIGATION     VAGINAL HYSTERECTOMY N/A 10/13/2015   Procedure: HYSTERECTOMY VAGINAL;  Surgeon: Florian Buff, MD;  Location: [facility];  Service: Gynecology;  Laterality: N/A;   Family History:  Family History  Problem Relation Age of Onset   Diabetes Mother    Congestive Heart Failure Mother    Depression Father    Hypertension Father    Cancer Father    Heart disease Maternal Uncle    Depression Maternal Grandmother    Family Psychiatric  History: See H&P.  Social History:  Social History   Substance and Sexual Activity  Alcohol Use No   Alcohol/week: 0.0 standard drinks     Social History   Substance and Sexual Activity  Drug Use No   Comment: Hx of marijuana use - None now    Social History   Socioeconomic History   Marital status: Divorced    Spouse name: Not on file   Number of children: 2   Years of education: 11   Highest education level: 11th grade  Occupational History   Not on file  Tobacco Use   Smoking status: Never    Passive exposure: Never   Smokeless tobacco: Never   Tobacco comments:    Patient denied use of tobacco  Substance and Sexual Activity   Alcohol use: No    Alcohol/week: 0.0 standard drinks   Drug use: No    Comment: Hx of marijuana use - None now   Sexual activity: Not Currently    Birth control/protection: Surgical  Other Topics Concern   Not on file  Social History Narrative    Lives with daughter in [place], [place].  Denied use of tobacco, alcohol, all drug use.   Social Determinants of Health   Financial Resource Strain: Not on file  Food Insecurity: Not on file  Transportation Needs: Not on file  Physical Activity: Not on file  Stress: Not on file  Social Connections: Not on file   Additional Social History:   Sleep: Good  Appetite:  Good  Current Medications: Current Facility-Administered Medications  Medication Dose Route Frequency Provider Last Rate Last Admin   acetaminophen (TYLENOL) tablet 650 mg  650 mg Oral Q6H PRN Chalmers Guest, NP   650 mg at 07/30/21 1334   albuterol (PROVENTIL) (2.5 MG/3ML) 0.083% nebulizer solution 2.5 mg  2.5 mg Nebulization Q6H PRN Encarnacion Slates, NP  albuterol (VENTOLIN HFA) 108 (90 Base) MCG/ACT inhaler 2 puff  2 puff Inhalation Q6H PRN Venicia Vandall, Herbert Pun I, NP       alum & mag hydroxide-simeth (MAALOX/MYLANTA) 200-200-20 MG/5ML suspension 30 mL  30 mL Oral Q4H PRN Chalmers Guest, NP       ARIPiprazole (ABILIFY) tablet 10 mg  10 mg Oral Daily Anakaren Campion, Herbert Pun I, NP   10 mg at 07/30/21 0754   ciprofloxacin (CIPRO) tablet 500 mg  500 mg Oral BID Lindell Spar I, NP   500 mg at 07/30/21 0754   estradiol (ESTRACE) tablet 2 mg  2 mg Oral Daily Chalmers Guest, NP   2 mg at 07/30/21 0754   furosemide (LASIX) tablet 20 mg  20 mg Oral Daily Chalmers Guest, NP   20 mg at 07/30/21 0754   hydrOXYzine (ATARAX/VISTARIL) tablet 25 mg  25 mg Oral TID PRN Chalmers Guest, NP   25 mg at 07/28/21 0732   magnesium hydroxide (MILK OF MAGNESIA) suspension 30 mL  30 mL Oral Daily PRN Chalmers Guest, NP       melatonin tablet 5 mg  5 mg Oral QHS Massengill, Ovid Curd, MD   5 mg at 07/28/21 2121   nicotine (NICODERM CQ - dosed in mg/24 hours) patch 21 mg  21 mg Transdermal Q0600 Chalmers Guest, NP   21 mg at 07/29/21 0802   nitroGLYCERIN (NITROSTAT) SL tablet 0.4 mg  0.4 mg Sublingual Q5 min PRN Chalmers Guest, NP       pantoprazole (PROTONIX) EC tablet  40 mg  40 mg Oral Daily Chalmers Guest, NP   40 mg at 07/30/21 0754   polyethylene glycol (MIRALAX / GLYCOLAX) packet 17 g  17 g Oral BID PRN Chalmers Guest, NP       potassium chloride SA (KLOR-CON) CR tablet 20 mEq  20 mEq Oral BID Chalmers Guest, NP   20 mEq at 07/30/21 1517   Lab Results:  Results for orders placed or performed during the hospital encounter of 07/26/21 (from the past 48 hour(s))  Potassium     Status: None   Collection Time: 07/29/21  6:44 AM  Result Value Ref Range   Potassium 4.1 3.5 - 5.1 mmol/L    Comment: Performed at [facility], [place] [address], [place], [place] [address]   Blood Alcohol level:  Lab Results  Component Value Date   ETH <10 37/06/6268   Metabolic Disorder Labs: Lab Results  Component Value Date   HGBA1C 5.4 07/27/2021   MPG 108.28 07/27/2021   No results found for: PROLACTIN Lab Results  Component Value Date   CHOL 177 07/27/2021   TRIG 172 (H) 07/27/2021   HDL 45 07/27/2021   CHOLHDL 3.9 07/27/2021   VLDL 34 07/27/2021   LDLCALC 98 07/27/2021   LDLCALC 124 (H) 12/31/2015   Physical Findings: AIMS: Facial and Oral Movements Muscles of Facial Expression: None, normal Lips and Perioral Area: None, normal Jaw: None, normal Tongue: None, normal,Extremity Movements Upper (arms, wrists, hands, fingers): None, normal Lower (legs, knees, ankles, toes): None, normal, Trunk Movements Neck, shoulders, hips: None, normal, Overall Severity Severity of abnormal movements (highest score from questions above): None, normal Incapacitation due to abnormal movements: None, normal Patient's awareness of abnormal movements (rate only patient's report): No Awareness, Dental Status Current problems with teeth and/or dentures?: No Does patient usually wear dentures?: No  CIWA:    COWS:     Musculoskeletal:  Strength & Muscle Tone: within normal limits Gait & Station: normal Patient leans: N/A  Psychiatric Specialty  Exam:  Presentation  General Appearance: Appropriate for Environment; Casual; Fairly Groomed  Eye Contact:Good  Speech:Clear and Coherent (slightly pressured)  Speech Volume:Increased  Handedness:Right  Mood and Affect  Mood:Euphoric  Affect:Appropriate; Congruent  Thought Process  Thought Processes:Coherent; Disorganized  Descriptions of Associations:Tangential  Orientation:Full (Time, Place and Person)  Thought Content:Tangential; Rumination  History of Schizophrenia/Schizoaffective disorder:Yes  Duration of Psychotic Symptoms:Greater than six months  Hallucinations:Hallucinations: None Description of Visual Hallucinations: NA  Ideas of Reference:Delusions  Suicidal Thoughts:Suicidal Thoughts: No SI Passive Intent and/or Plan: Without Intent; Without Plan; Without Means to Carry Out; Without Access to Means  Homicidal Thoughts:Homicidal Thoughts: No  Sensorium  Memory:Immediate Fair; Recent Fair; Remote Poor  Judgment:Fair  Insight:Fair  Executive Functions  Concentration:Fair  Attention Span:Fair  [place] of Knowledge:Poor  Language:Good  Psychomotor Activity  Psychomotor Activity:Psychomotor Activity: Normal  Assets  Assets:Communication Skills; Desire for Improvement; Financial Resources/Insurance; Housing; Resilience; Social Support  Sleep  Sleep:Sleep: Fair Number of Hours of Sleep: 5  Physical Exam: Physical Exam Vitals and nursing note reviewed.  HENT:     Nose: Nose normal.     Mouth/Throat:     Pharynx: Oropharynx is clear.  Eyes:     Pupils: Pupils are equal, round, and reactive to light.  Cardiovascular:     Rate and Rhythm: Normal rate.     Pulses: Normal pulses.  Pulmonary:     Effort: Pulmonary effort is normal.     Comments: Hx. COPD Genitourinary:    Comments: Deferred Musculoskeletal:        General: Normal range of motion.     Cervical back: Normal range of motion.  Skin:    General: Skin is warm  and dry.  Neurological:     General: No focal deficit present.     Mental Status: She is alert and oriented to person, place, and time.   Review of Systems  Constitutional:  Negative for chills and fever.  HENT:  Negative for congestion and sore throat.   Eyes:  Negative for blurred vision.  Respiratory:  Negative for cough, shortness of breath and wheezing.   Cardiovascular:  Negative for chest pain and palpitations.  Gastrointestinal:  Negative for abdominal pain, constipation, diarrhea, heartburn, nausea and vomiting.  Genitourinary:  Negative for dysuria and urgency.  Musculoskeletal:  Negative for joint pain and myalgias.  Neurological:  Negative for dizziness, tingling, tremors, sensory change, speech change, focal weakness, seizures, loss of consciousness, weakness and headaches.  Endo/Heme/Allergies:        Toradol. Buspirone  Lyrica Gabapentin Tramadol Zofran  Penicillins       Psychiatric/Behavioral:  Positive for depression ("Improving"). Negative for hallucinations (Hx. visual hallucinations.), memory loss, substance abuse and suicidal ideas. The patient is not nervous/anxious and does not have insomnia.   Blood pressure 118/81, pulse (!) 106, temperature 98 F (36.7 C), temperature source Oral, resp. rate 18, height 5\' 2"  (1.575 m), weight 91.6 kg, last menstrual period 09/12/2015, SpO2 95 %. Body mass index is 36.95 kg/m.  Treatment Plan Summary: Daily contact with patient to assess and evaluate symptoms and progress in treatment and Medication management.   Continue inpatient hospitalization. Will continue today 07/30/2021 plan as below except where it is noted.   Diagnoses: Schizoaffective disorder, bipolar-type/r/o Schizophrenia/bipolar.  PTSD.    Other medical conditions.  COPD CHF   2. Medication management to reduce current symptoms to  base line and improve the patient's overall level of functioning: See [facility] for plan of care.   Major depressive  disorder with psychosis, R/o bipolar disorder/Schizophrenia.  Continue Abilify to 10 mg po daily.    Anxiety.  Continue Hydroxyzine 25 mg po tid prn.   Insomnia.  Continue Melatonin 5 mg po Q hs.   3. Other medical conditions/ailments, continue:  Albuterol inhaler 2 puff Q 6 hrs prn for SOB. Albuterol nebulizing treatment 2.5 mg QQ 6 hrs prn for wheezing/SOB. Cipro 500 mg po bid x 7 days for uti. Estradiol 2 mg po daily for estrogen replacement. Furosemide 20 mg po daily for hx of CHF.Nicotine patch 21 mg topically Q 24 hrs for nicotine withdrawal. NTG 0.4 mg sublingually Q 5 minutes prn for chest pain. Protonix 40 mg po Q am for GERD. Miralax 17 g twice daily prn for constipation. Kdur 20 meq po bid for potassium replacement.    Continue to encourage group attendance/participation. Discharge disposition in progress.  Lindell Spar, NP, pmhnp, fnp-bc 07/30/2021, 4:09 PM Patient ID: [name], female   DOB: 04/01/1971, 50 y.o.   MRN: [id number] Patient ID: ZAILYNN BRANDEL, female   DOB: May 05, 1971, 50 y.o.   MRN: [id number]

## 2021-07-31 LAB — RESP PANEL BY RT-PCR (FLU A&B, COVID) ARPGX2
Influenza A by PCR: NEGATIVE
Influenza B by PCR: NEGATIVE
SARS Coronavirus 2 by RT PCR: NEGATIVE

## 2021-07-31 MED ORDER — LISINOPRIL 5 MG PO TABS
5.0000 mg | ORAL_TABLET | Freq: Every day | ORAL | Status: DC
  Administered 2021-08-01 – 2021-08-04 (×4): 5 mg via ORAL
  Filled 2021-07-31: qty 1
  Filled 2021-07-31: qty 7
  Filled 2021-07-31: qty 1
  Filled 2021-07-31: qty 7
  Filled 2021-07-31 (×4): qty 1

## 2021-07-31 MED ORDER — LISINOPRIL 10 MG PO TABS
10.0000 mg | ORAL_TABLET | Freq: Once | ORAL | Status: AC
  Administered 2021-07-31: 10 mg via ORAL
  Filled 2021-07-31: qty 1

## 2021-07-31 NOTE — Progress Notes (Signed)
   07/31/21 2245  Psych Admission Type (Psych Patients Only)  Admission Status Voluntary  Psychosocial Assessment  Patient Complaints Depression  Eye Contact Fair  Facial Expression Flat  Affect Appropriate to circumstance  Speech Logical/coherent  Interaction Assertive  Motor Activity Slow  Appearance/Hygiene Unremarkable  Behavior Characteristics Appropriate to situation  Mood Depressed;Pleasant  Thought Process  Coherency WDL  Content WDL  Delusions None reported or observed  Perception WDL  Hallucination None reported or observed  Judgment Poor  Confusion None  Danger to Self  Current suicidal ideation? Denies  Self-Injurious Behavior No self-injurious ideation or behavior indicators observed or expressed   Agreement Not to Harm Self Yes  Description of Agreement verbal  Danger to Others  Danger to Others None reported or observed

## 2021-07-31 NOTE — [facility] Group Notes (Signed)
Adult Psychoeducational Group Not Date:  07/24/2021 Time:  0900-1045 Group Topic/Focus: PROGRESSIVE RELAXATION. A group where deep breathing is taught and tensing and relaxation muscle groups is used. Imagery is used as well.  Pts are asked to imagine 3 pillars that hold them up when they are not able to hold themselves up.  Participation Level:  Active  Participation Quality:  Appropriate  Affect:  Appropriate  Cognitive:  Oriented  Insight: Improving  Engagement in Group:  Engaged  Modes of Intervention:  Activity, Discussion, Education, and Support  Additional Comments:  Rates her energy at a 10/10. States that her Theodoro Kos, her children and her grandchildren hold her up when she is not able to hold herself up.   Paulino Rily

## 2021-07-31 NOTE — Progress Notes (Signed)
[facility] MD Progress Note  07/31/2021 3:37 PM KOURTNIE SACHS  MRN:  [id number]  Subjective: Shadiyah reports, "I'm doing great, ready to go home. My daughter is having surgery on Monday, she got a cyst to her ovaries & also needs a heart monitor just like me. And my son who has a hole in his heart is going to the doctor on Tuesday to find out when he is going have his own surgery to close the hole. My mother just found out on Friday that she got cancer to her intestine. These are the reasons that I have got to go home today, or may be tomorrow. I need to be with my family". Daily notes: Angelic is seen, chart reviewed. The chart findings discussed with the treatment team. She presents alert, oriented & aware of situation. She is visible on the unit, attending group sessions. She described her mood as "great". She presents with a good affect, good eye contact. She remains pleasantly disorganized & tangential, mildly hypomanic & grandiose. She states that she is ready to be discharged from the hospital to be with her family who are currently battling some health issues. Abbigayle is made aware that she will be eventually discharged once we know that she is doing well on all her medicines & her symptoms getting better. Since admission, patient has been telling the staff one story after another, most of them bizarre & health related. Staff continue to redirect patient & encourage her to continue her current treatment plan to stabilize her mood. There are no disruptive or behavioral issues displayed on the unit. She denies any SIHI, AVH, delusional thoughts or paranoia. She does not appear to be responding to any internal stimuli.  She remains on Abilify 10 mg daily. She denies any side effects. She slept for about 4.5 hours last night per documentation. We will continue to monitor her mood symptoms. Reviewed vital signs (stable). We will continue current plan of care as already in progress. Patient at this time is in no  apparent distress. Patient's current mental state may be her baseline. Initiated Lisinopril for elevated blood pressure.  Objective: 50 year old Caucasian female with prior hx of mental illness/psychiatric admissions. She has in the past been admitted to the [facility] psychiatric ward in [place] just of recent, at the [facility]. She was receiving mental health care on an outpatient basis at the [facility] in [facility] with Dr. Sheralyn Boatman. She is currently admitted to the [facility] from the [facility] with complain of worsening suicidal ideation with plan & an attempt to cut her wrists. Patient also admitted to the  Medical Center staff that she was also seeing her dead fiance in her kitchen.  Principal Problem: Bipolar I disorder, current or most recent episode depressed, with psychotic features ([place])  Diagnosis: Principal Problem:   Bipolar I disorder, current or most recent episode depressed, with psychotic features ([place]) Active Problems:   Diastolic CHF ([place])   COPD (chronic obstructive pulmonary disease) ([place])   Tobacco user   PTSD (post-traumatic stress disorder)  Total Time spent with patient:  25 minutes  Past Psychiatric History: Major depressive disorder, PTSD  Past Medical History:  Past Medical History:  Diagnosis Date   Anxiety    Arthritis    Rheumatoid   Asthma    Breast cancer ([place])    CHF (congestive heart failure) (H[facility])    Chronic back pain    Colon cancer ([place])    COPD (chronic  obstructive pulmonary disease) (H[facility])    Depression    Fibromyalgia    Gout    Heart attack ([place]) 2017   Renal disorder    Right lumbar radiculopathy    Vitiligo    face    Past Surgical History:  Procedure Laterality Date   CESAREAN SECTION     DILITATION & CURRETTAGE/HYSTROSCOPY WITH NOVASURE ABLATION N/A 07/07/2015   Procedure: HYSTEROSCOPY, UTERINE CURETTAGE, ENDOMETRIAL  ABLATION Uterine Cavity Length=6.5cm Uterine Cavity Width=4.5cm Power=161 Watts Time=1 minute 19  seconds;  Surgeon: Florian Buff, MD;  Location: [facility];  Service: Gynecology;  Laterality: N/A;   POLYPECTOMY  07/07/2015   Procedure: POLYPECTOMY;  Surgeon: Florian Buff, MD;  Location: [facility];  Service: Gynecology;;   SALPINGOOPHORECTOMY Bilateral 10/13/2015   Procedure: BILATERAL SALPINGO OOPHORECTOMY;  Surgeon: Florian Buff, MD;  Location: [facility];  Service: Gynecology;  Laterality: Bilateral;   TUBAL LIGATION     VAGINAL HYSTERECTOMY N/A 10/13/2015   Procedure: HYSTERECTOMY VAGINAL;  Surgeon: Florian Buff, MD;  Location: [facility];  Service: Gynecology;  Laterality: N/A;   Family History:  Family History  Problem Relation Age of Onset   Diabetes Mother    Congestive Heart Failure Mother    Depression Father    Hypertension Father    Cancer Father    Heart disease Maternal Uncle    Depression Maternal Grandmother    Family Psychiatric  History: See H&P.  Social History:  Social History   Substance and Sexual Activity  Alcohol Use No   Alcohol/week: 0.0 standard drinks     Social History   Substance and Sexual Activity  Drug Use No   Comment: Hx of marijuana use - None now    Social History   Socioeconomic History   Marital status: Divorced    Spouse name: Not on file   Number of children: 2   Years of education: 11   Highest education level: 11th grade  Occupational History   Not on file  Tobacco Use   Smoking status: Never    Passive exposure: Never   Smokeless tobacco: Never   Tobacco comments:    Patient denied use of tobacco  Substance and Sexual Activity   Alcohol use: No    Alcohol/week: 0.0 standard drinks   Drug use: No    Comment: Hx of marijuana use - None now   Sexual activity: Not Currently    Birth control/protection: Surgical  Other Topics Concern   Not on file  Social History Narrative   Lives with daughter in [place], [place].  Denied use of tobacco, alcohol, all drug use.   Social Determinants of Health   Financial Resource Strain: Not  on file  Food Insecurity: Not on file  Transportation Needs: Not on file  Physical Activity: Not on file  Stress: Not on file  Social Connections: Not on file   Additional Social History:   Sleep: Good  Appetite:  Good  Current Medications: Current Facility-Administered Medications  Medication Dose Route Frequency Provider Last Rate Last Admin   acetaminophen (TYLENOL) tablet 650 mg  650 mg Oral Q6H PRN Chalmers Guest, NP   650 mg at 07/30/21 1334   albuterol (PROVENTIL) (2.5 MG/3ML) 0.083% nebulizer solution 2.5 mg  2.5 mg Nebulization Q6H PRN Tsuruko Murtha I, NP       albuterol (VENTOLIN HFA) 108 (90 Base) MCG/ACT inhaler 2 puff  2 puff Inhalation Q6H PRN Encarnacion Slates, NP  alum & mag hydroxide-simeth (MAALOX/MYLANTA) 200-200-20 MG/5ML suspension 30 mL  30 mL Oral Q4H PRN Chalmers Guest, NP       ARIPiprazole (ABILIFY) tablet 10 mg  10 mg Oral Daily Lindell Spar I, NP   10 mg at 07/31/21 7915   ciprofloxacin (CIPRO) tablet 500 mg  500 mg Oral BID Lindell Spar I, NP   500 mg at 07/31/21 0569   estradiol (ESTRACE) tablet 2 mg  2 mg Oral Daily Chalmers Guest, NP   2 mg at 07/31/21 7948   furosemide (LASIX) tablet 20 mg  20 mg Oral Daily Chalmers Guest, NP   20 mg at 07/31/21 0165   hydrOXYzine (ATARAX/VISTARIL) tablet 25 mg  25 mg Oral TID PRN Chalmers Guest, NP   25 mg at 07/28/21 0732   magnesium hydroxide (MILK OF MAGNESIA) suspension 30 mL  30 mL Oral Daily PRN Chalmers Guest, NP       melatonin tablet 5 mg  5 mg Oral QHS Massengill, Ovid Curd, MD   5 mg at 07/30/21 2126   nicotine (NICODERM CQ - dosed in mg/24 hours) patch 21 mg  21 mg Transdermal Q0600 Chalmers Guest, NP   21 mg at 07/31/21 0839   nitroGLYCERIN (NITROSTAT) SL tablet 0.4 mg  0.4 mg Sublingual Q5 min PRN Chalmers Guest, NP       pantoprazole (PROTONIX) EC tablet 40 mg  40 mg Oral Daily Chalmers Guest, NP   40 mg at 07/31/21 5374   polyethylene glycol (MIRALAX / GLYCOLAX) packet 17 g  17 g Oral BID PRN Chalmers Guest, NP       potassium chloride SA (KLOR-CON) CR tablet 20 mEq  20 mEq Oral BID Chalmers Guest, NP   20 mEq at 07/31/21 0840   Lab Results:  Results for orders placed or performed during the hospital encounter of 07/26/21 (from the past 48 hour(s))  Resp Panel by RT-PCR (Flu A&B, Covid) Nasopharyngeal Swab     Status: None   Collection Time: 07/31/21  4:13 AM   Specimen: Nasopharyngeal Swab; Nasopharyngeal(NP) swabs in vial transport medium  Result Value Ref Range   SARS Coronavirus 2 by RT PCR NEGATIVE NEGATIVE    Comment: (NOTE) SARS-CoV-2 target nucleic acids are NOT DETECTED.  The SARS-CoV-2 RNA is generally detectable in upper respiratory specimens during the acute phase of infection. The lowest concentration of SARS-CoV-2 viral copies this assay can detect is 138 copies/mL. A negative result does not preclude SARS-Cov-2 infection and should not be used as the sole basis for treatment or other patient management decisions. A negative result may occur with  improper specimen collection/handling, submission of specimen other than nasopharyngeal swab, presence of viral mutation(s) within the areas targeted by this assay, and inadequate number of viral copies(<138 copies/mL). A negative result must be combined with clinical observations, patient history, and epidemiological information. The expected result is Negative.  Fact Sheet for Patients:  EntrepreneurPulse.com.au  Fact Sheet for Healthcare Providers:  IncredibleEmployment.be  This test is no t yet approved or cleared by the [place] FDA and  has been authorized for detection and/or diagnosis of SARS-CoV-2 by FDA under an Emergency Use Authorization (EUA). This EUA will remain  in effect (meaning this test can be used) for the duration of the COVID-19 declaration under Section 564(b)(1) of the Act, 21 U.S.C.section 360bbb-3(b)(1), unless the authorization is terminated  or revoked  sooner.       Influenza  A by PCR NEGATIVE NEGATIVE   Influenza B by PCR NEGATIVE NEGATIVE    Comment: (NOTE) The Xpert Xpress SARS-CoV-2/FLU/RSV plus assay is intended as an aid in the diagnosis of influenza from Nasopharyngeal swab specimens and should not be used as a sole basis for treatment. Nasal washings and aspirates are unacceptable for Xpert Xpress SARS-CoV-2/FLU/RSV testing.  Fact Sheet for Patients: EntrepreneurPulse.com.au  Fact Sheet for Healthcare Providers: IncredibleEmployment.be  This test is not yet approved or cleared by the [place] FDA and has been authorized for detection and/or diagnosis of SARS-CoV-2 by FDA under an Emergency Use Authorization (EUA). This EUA will remain in effect (meaning this test can be used) for the duration of the COVID-19 declaration under Section 564(b)(1) of the Act, 21 U.S.C. section 360bbb-3(b)(1), unless the authorization is terminated or revoked.  Performed at [facility], [place] [address] [place] [address]    Blood Alcohol level:  Lab Results  Component Value Date   ETH <10 17/00/1749   Metabolic Disorder Labs: Lab Results  Component Value Date   HGBA1C 5.4 07/27/2021   MPG 108.28 07/27/2021   No results found for: PROLACTIN Lab Results  Component Value Date   CHOL 177 07/27/2021   TRIG 172 (H) 07/27/2021   HDL 45 07/27/2021   CHOLHDL 3.9 07/27/2021   VLDL 34 07/27/2021   LDLCALC 98 07/27/2021   LDLCALC 124 (H) 12/31/2015   Physical Findings: AIMS: Facial and Oral Movements Muscles of Facial Expression: None, normal Lips and Perioral Area: None, normal Jaw: None, normal Tongue: None, normal,Extremity Movements Upper (arms, wrists, hands, fingers): None, normal Lower (legs, knees, ankles, toes): None, normal, Trunk Movements Neck, shoulders, hips: None, normal, Overall Severity Severity of abnormal movements (highest score from  questions above): None, normal Incapacitation due to abnormal movements: None, normal Patient's awareness of abnormal movements (rate only patient's report): No Awareness, Dental Status Current problems with teeth and/or dentures?: No Does patient usually wear dentures?: No  CIWA:    COWS:     Musculoskeletal: Strength & Muscle Tone: within normal limits Gait & Station: normal Patient leans: N/A  Psychiatric Specialty Exam:  Presentation  General Appearance: Appropriate for Environment; Casual; Fairly Groomed  Eye Contact:Good  Speech:Clear and Coherent; Normal Rate  Speech Volume:Normal  Handedness:Right  Mood and Affect  Mood:Dysphoric  Affect:Congruent  Thought Process  Thought Processes:Disorganized  Descriptions of Associations:Tangential  Orientation:Full (Time, Place and Person)  Thought Content:Rumination; Tangential  History of Schizophrenia/Schizoaffective disorder:No  Duration of Psychotic Symptoms:N/A  Hallucinations:Hallucinations: None Description of Visual Hallucinations: NA  Ideas of Reference:None  Suicidal Thoughts:Suicidal Thoughts: No SI Passive Intent and/or Plan: Without Intent; Without Plan; Without Means to Carry Out; Without Access to Means  Homicidal Thoughts:Homicidal Thoughts: No  Sensorium  Memory:Immediate Good; Recent Fair; Remote Fair  Judgment:Fair  Insight:Fair  Executive Functions  Concentration:Fair  Attention Span:Fair  [place]  Language:Good  Psychomotor Activity  Psychomotor Activity:Psychomotor Activity: Normal  Assets  Assets:Communication Skills; Desire for Improvement; Financial Resources/Insurance; Housing; Resilience; Social Support  Sleep  Sleep:Sleep: Poor Number of Hours of Sleep: 4.5  Physical Exam: Physical Exam Vitals and nursing note reviewed.  HENT:     Nose: Nose normal.     Mouth/Throat:     Pharynx: Oropharynx is clear.  Eyes:     Pupils: Pupils  are equal, round, and reactive to light.  Cardiovascular:     Rate and Rhythm: Normal rate.     Pulses: Normal pulses.  Pulmonary:     Effort: Pulmonary effort is normal.     Comments: Hx. COPD Genitourinary:    Comments: Deferred Musculoskeletal:        General: Normal range of motion.     Cervical back: Normal range of motion.  Skin:    General: Skin is warm and dry.  Neurological:     General: No focal deficit present.     Mental Status: She is alert and oriented to person, place, and time.   Review of Systems  Constitutional:  Negative for chills and fever.  HENT:  Negative for congestion and sore throat.   Eyes:  Negative for blurred vision.  Respiratory:  Negative for cough, shortness of breath and wheezing.   Cardiovascular:  Negative for chest pain and palpitations.  Gastrointestinal:  Negative for abdominal pain, constipation, diarrhea, heartburn, nausea and vomiting.  Genitourinary:  Negative for dysuria and urgency.  Musculoskeletal:  Negative for joint pain and myalgias.  Neurological:  Negative for dizziness, tingling, tremors, sensory change, speech change, focal weakness, seizures, loss of consciousness, weakness and headaches.  Endo/Heme/Allergies:        Toradol. Buspirone  Lyrica Gabapentin Tramadol Zofran  Penicillins       Psychiatric/Behavioral:  Positive for depression ("Improving"). Negative for hallucinations (Hx. visual hallucinations.), memory loss, substance abuse and suicidal ideas. The patient is not nervous/anxious and does not have insomnia.   Blood pressure (!) 141/100, pulse 89, temperature 97.6 F (36.4 C), temperature source Oral, resp. rate 20, height 5\' 2"  (1.575 m), weight 91.6 kg, last menstrual period 09/12/2015, SpO2 100 %. Body mass index is 36.95 kg/m.  Treatment Plan Summary: Daily contact with patient to assess and evaluate symptoms and progress in treatment and Medication management.   Continue inpatient  hospitalization. Will continue today 07/31/2021 plan as below except where it is noted.   Diagnoses: Schizoaffective disorder, bipolar-type/r/o Schizophrenia/bipolar.  PTSD.    Other medical conditions.  COPD CHF   2. Medication management to reduce current symptoms to base line and improve the patient's overall level of functioning: See [facility] for plan of care.   Major depressive disorder with psychosis, R/o bipolar disorder/Schizophrenia.  Continue Abilify to 10 mg po daily.    Anxiety.  Continue Hydroxyzine 25 mg po tid prn.   Insomnia.  Continue Melatonin 5 mg po Q hs.   3. Other medical conditions/ailments, continue:  Albuterol inhaler 2 puff Q 6 hrs prn for SOB. Albuterol nebulizing treatment 2.5 mg QQ 6 hrs prn for wheezing/SOB. Cipro 500 mg po bid x 7 days for uti. Estradiol 2 mg po daily for estrogen replacement. Furosemide 20 mg po daily for hx of CHF.Nicotine patch 21 mg topically Q 24 hrs for nicotine withdrawal. NTG 0.4 mg sublingually Q 5 minutes prn for chest pain. Protonix 40 mg po Q am for GERD. Miralax 17 g twice daily prn for constipation. Kdur 20 meq po bid for potassium replacement. Started on Lisinopril 5 mg po daily HTN.  Lisinopril 10 mg po x 1 (loading dose) for HTN today.   Continue to encourage group attendance/participation. Discharge disposition in progress.  Lindell Spar, NP, pmhnp, fnp-bc 07/31/2021, 3:37 PM Patient ID: [name], female   DOB: 30-Jul-1971, 50 y.o.   MRN: [id number] Patient ID: SALA TAGUE, female   DOB: 1971/05/06, 50 y.o.   MRN: [id number] Patient ID: DHANI IMEL, female   DOB: [birth year], 50 y.o.   MRN: [id number]

## 2021-07-31 NOTE — Progress Notes (Signed)
   07/31/21 1500  Charting Type  Charting Type Shift assessment  Assessment of needs addressed Yes  Orders Chart Check (once per shift) Completed  Safety Check Verification  Has the RN verified the 15 minute safety check completion? Yes  Neurological  Neuro (WDL) WDL  HEENT  HEENT (WDL) WDL  Respiratory  Respiratory (WDL) X (COPD)  Cardiac  Cardiac (WDL) X  Vascular  Vascular (WDL) X  Integumentary  Integumentary (WDL) X (no new finding)  Braden Scale (Ages 8 and up)  Sensory Perceptions 4  Moisture 4  Activity 4  Mobility 4  Nutrition 3  Friction and Shear 3  Braden Scale Score 22  Musculoskeletal  Musculoskeletal (WDL) X (Chronic back pain)  Assistive Device None  Gastrointestinal  Gastrointestinal (WDL) WDL  GU Assessment  Genitourinary (WDL) WDL  Neurological  Level of Consciousness Alert   D- Patient alert and oriented. Patient affect/mood reported as okay. Denies SI, HI, AVH, and pain. Patient Goal:  " talk to a friend, and planning to go home".   A- Scheduled medications administered to patient, per MD orders. Support and encouragement provided.  Routine safety checks conducted every 15 minutes.  Patient informed to notify staff with problems or concerns. R- No adverse drug reactions noted. Patient contracts for safety at this time. Patient compliant with medications and treatment plan. Patient receptive, calm, and cooperative. Patient interacts well with others on the unit.  Patient remains safe at this time.

## 2021-07-31 NOTE — Group Note (Signed)
[facility] LCSW Group Therapy Note  Date/Time:  07/31/2021 11:00am-11:30am  Type of Therapy and Topic:  Group Therapy:  Healthy and Unhealthy Supports  Participation Level:  Active   Description of Group:  Patients in this group were led in a discussion about the differences between healthy and unhealthy supports.  They were introduced to the idea of adding a variety of healthy supports to address the various needs in their lives, as well as the idea that unhealthy supports need to be eliminated from their lives through the use of appropriate boundary setting.  A song entitled, "My Own Hero" was played and then discussed.  Patients acknowledged as a whole that it is important to be involved in one's own recovery journey, and that by doing so, they are acting as a hero to themselves.  Therapeutic Goals:   1)  discuss the difference between healthy and unhealthy supports  2)  describe the importance of adding healthy supports to stay well once out of the hospital  3)  discuss options for how to address unhealthy supports  5)  encourage active participation in one's own recovery plan    Summary of Patient Progress:  The patient stated that current healthy supports in her life are her oldest and youngest sons while current unhealthy supports include herself when she attempts suicide.  She looks forward to going home with her daughter and daughter's husband tomorrow, and states they are helping her to find her own place.  The patient expressed a willingness to add self-support to help in her recovery journey.   Therapeutic Modalities:   Motivational Interviewing Brief Solution-Focused Therapy  Selmer Dominion, LCSW

## 2021-07-31 NOTE — [facility] Group Notes (Signed)
Adult Psychoeducational Group Note  Date:  07/31/2021 Time:  8:43 PM  Group Topic/Focus:  Wrap-Up Group:   The focus of this group is to help patients review their daily goal of treatment and discuss progress on daily workbooks.  Participation Level:  Active  Participation Quality:  Appropriate and Attentive  Affect:  Appropriate  Cognitive:  Alert and Appropriate  Insight: Appropriate and Good  Engagement in Group:  Engaged  Modes of Intervention:  Discussion and Education  Additional Comments:  Pt attended and participated in wrap up group this evening and rated their day a 10/10, due to them going home tomorrow. Pt plans to spend time with family, go on a few trips and to continue counseling and therapy once discharged.   Cristi Loron 07/31/2021, 8:43 PM

## 2021-07-31 NOTE — [facility] Group Notes (Signed)
Psychoeducational Group Note  Date:  07/31/2021 Time:  1300-1400   Group Topic/Focus: This is a continuation of the group from Saturday. Pt's have been asked to formulate a list of 30 positives about themselves. This list is to be read 2 times a day for 30 days, looking in a mirror. Changing patterns of negative self talk. Also discussed is the fact that there have been some people who hurt [name] in the past. We keep that memory alive within [name]. Ways to cope with this are discused   Participation Level:  Active  Participation Quality:  Appropriate  Affect:  Appropriate  Cognitive:  Oriented  Insight: Improving  Engagement in Group:  Engaged  Modes of Intervention:  Activity, Discussion, Education, and Support  Additional Comments:  Rates her energy at a 10/10. Interacted with her peers, gave and received feedback.  Paulino Rily

## 2021-08-01 ENCOUNTER — Encounter (HOSPITAL_COMMUNITY): Payer: Self-pay

## 2021-08-01 DIAGNOSIS — F315 Bipolar disorder, current episode depressed, severe, with psychotic features: Principal | ICD-10-CM

## 2021-08-01 MED ORDER — ARIPIPRAZOLE 5 MG PO TABS
5.0000 mg | ORAL_TABLET | Freq: Once | ORAL | Status: AC
  Administered 2021-08-01: 5 mg via ORAL
  Filled 2021-08-01 (×2): qty 1

## 2021-08-01 MED ORDER — ARIPIPRAZOLE 15 MG PO TABS
15.0000 mg | ORAL_TABLET | Freq: Every day | ORAL | Status: DC
  Administered 2021-08-02: 15 mg via ORAL
  Filled 2021-08-01 (×2): qty 1

## 2021-08-01 NOTE — Group Note (Signed)
LCSW Group Therapy Note   Group Date: 08/01/2021 Start Time: 1300 End Time: 35  CSW was unable to hold group due to RN group being held during this time.   Mliss Fritz, LCSWA 08/01/2021  2:00 PM

## 2021-08-01 NOTE — Progress Notes (Addendum)
[facility] MD Progress Note  08/01/2021 3:16 PM Lynn Logan  MRN:  [id number]  Subjective: Lynn Logan reports, "I'm doing great, ready to go home. My daughter is having open heart surgery on Monday. My son who has a hole in his heart is going to the doctor on Tuesday to find out when he is going have his own surgery to close the hole. I found out Friday that my mother has stomach cancer. I hope I can go home tomorrow, I need to be with my family". Daily notes: Patient is seen, chart reviewed and case discussed with the treatment team. She is alert, oriented & aware of situation. She is visible on the unit, attending group sessions. She described her mood as "good". Her mood appears to be depressed. She maintains good eye contact. She is tangential with her descriptions and often takes the long way around to answer questions about herself. She stated she is ready to be discharged from the hospital to be with her family due to their many health problems. She has had no disruptive or behavioral issues on the unit. She denies any SI/HI, AVH, delusional thoughts or paranoia. She does not appear to be responding to any internal stimuli.  Her Abilify was increased from 10 mg  to 15 mg daily. She received an additional 5 mg dose today to make 15 mg. She denies medication side effects. She stated she slept great last night, sleep hours were not recorded today. She reported her appetite is getting better. She is visible on the unit and attending group therapy/unit activities. We will continue to monitor her symptoms and titrate medications. Reviewed vital signs (stable). We will continue to monitor for safety and medication effectiveness.   Objective: Patient is a 50 year old Caucasian female with prior hx of mental illness/psychiatric admissions. She has in the past been admitted to the [facility] psychiatric ward in [place] just of recent, at the [facility]. She was receiving mental health care on an outpatient basis  at the [facility] in [place] with Dr. Sheralyn Boatman. She is currently admitted to the [facility] from the [facility] with complain of worsening suicidal ideation with plan & an attempt to cut her wrists. Patient also admitted to the [facility] staff that she was also seeing her dead fiance in her kitchen.  Principal Problem: Bipolar I disorder, current or most recent episode depressed, with psychotic features ([place])  Diagnosis: Principal Problem:   Bipolar I disorder, current or most recent episode depressed, with psychotic features ([place]) Active Problems:   Diastolic CHF ([place])   COPD (chronic obstructive pulmonary disease) ([place])   Tobacco user   PTSD (post-traumatic stress disorder)  Total Time spent with patient:  25 minutes  Past Psychiatric History: Major depressive disorder, PTSD  Past Medical History:  Past Medical History:  Diagnosis Date   Anxiety    Arthritis    Rheumatoid   Asthma    Breast cancer ([place])    CHF (congestive heart failure) (HCC)    Chronic back pain    Colon cancer ([place])    COPD (chronic obstructive pulmonary disease) ([place])    Depression    Fibromyalgia    Gout    Heart attack ([place]) 2017   Renal disorder    Right lumbar radiculopathy    Vitiligo    face    Past Surgical History:  Procedure Laterality Date   CESAREAN SECTION     DILITATION & CURRETTAGE/HYSTROSCOPY WITH NOVASURE ABLATION  N/A 07/07/2015   Procedure: HYSTEROSCOPY, UTERINE CURETTAGE, ENDOMETRIAL  ABLATION Uterine Cavity Length=6.5cm Uterine Cavity Width=4.5cm Power=161 Watts Time=1 minute 19 seconds;  Surgeon: Florian Buff, MD;  Location: [facility];  Service: Gynecology;  Laterality: N/A;   POLYPECTOMY  07/07/2015   Procedure: POLYPECTOMY;  Surgeon: Florian Buff, MD;  Location: [facility];  Service: Gynecology;;   SALPINGOOPHORECTOMY Bilateral 10/13/2015   Procedure: BILATERAL SALPINGO OOPHORECTOMY;  Surgeon: Florian Buff, MD;  Location: [facility];  Service: Gynecology;  Laterality:  Bilateral;   TUBAL LIGATION     VAGINAL HYSTERECTOMY N/A 10/13/2015   Procedure: HYSTERECTOMY VAGINAL;  Surgeon: Florian Buff, MD;  Location: [facility];  Service: Gynecology;  Laterality: N/A;   Family History:  Family History  Problem Relation Age of Onset   Diabetes Mother    Congestive Heart Failure Mother    Depression Father    Hypertension Father    Cancer Father    Heart disease Maternal Uncle    Depression Maternal Grandmother    Family Psychiatric  History: See H&P.  Social History:  Social History   Substance and Sexual Activity  Alcohol Use No   Alcohol/week: 0.0 standard drinks     Social History   Substance and Sexual Activity  Drug Use No   Comment: Hx of marijuana use - None now    Social History   Socioeconomic History   Marital status: Divorced    Spouse name: Not on file   Number of children: 2   Years of education: 11   Highest education level: 11th grade  Occupational History   Not on file  Tobacco Use   Smoking status: Never    Passive exposure: Never   Smokeless tobacco: Never   Tobacco comments:    Patient denied use of tobacco  Substance and Sexual Activity   Alcohol use: No    Alcohol/week: 0.0 standard drinks   Drug use: No    Comment: Hx of marijuana use - None now   Sexual activity: Not Currently    Birth control/protection: Surgical  Other Topics Concern   Not on file  Social History Narrative   Lives with daughter in [place], [place].  Denied use of tobacco, alcohol, all drug use.   Social Determinants of Health   Financial Resource Strain: Not on file  Food Insecurity: Not on file  Transportation Needs: Not on file  Physical Activity: Not on file  Stress: Not on file  Social Connections: Not on file   Additional Social History:   Sleep: Good  Appetite:  Good  Current Medications: Current Facility-Administered Medications  Medication Dose Route Frequency Provider Last Rate Last Admin   acetaminophen (TYLENOL) tablet  650 mg  650 mg Oral Q6H PRN Chalmers Guest, NP   650 mg at 07/31/21 1701   albuterol (PROVENTIL) (2.5 MG/3ML) 0.083% nebulizer solution 2.5 mg  2.5 mg Nebulization Q6H PRN Nwoko, Herbert Pun I, NP       albuterol (VENTOLIN HFA) 108 (90 Base) MCG/ACT inhaler 2 puff  2 puff Inhalation Q6H PRN Nwoko, Agnes I, NP       alum & mag hydroxide-simeth (MAALOX/MYLANTA) 200-200-20 MG/5ML suspension 30 mL  30 mL Oral Q4H PRN Chalmers Guest, NP       [START ON 08/02/2021] ARIPiprazole (ABILIFY) tablet 15 mg  15 mg Oral Daily Ethelene Hal, NP       ciprofloxacin (CIPRO) tablet 500 mg  500 mg Oral BID Encarnacion Slates, NP  500 mg at 08/01/21 3086   estradiol (ESTRACE) tablet 2 mg  2 mg Oral Daily Chalmers Guest, NP   2 mg at 08/01/21 5784   furosemide (LASIX) tablet 20 mg  20 mg Oral Daily Chalmers Guest, NP   20 mg at 08/01/21 6962   hydrOXYzine (ATARAX/VISTARIL) tablet 25 mg  25 mg Oral TID PRN Chalmers Guest, NP   25 mg at 07/31/21 2108   lisinopril (ZESTRIL) tablet 5 mg  5 mg Oral Daily Lindell Spar I, NP   5 mg at 08/01/21 9528   magnesium hydroxide (MILK OF MAGNESIA) suspension 30 mL  30 mL Oral Daily PRN Chalmers Guest, NP       melatonin tablet 5 mg  5 mg Oral QHS Zai Chmiel, Ovid Curd, MD   5 mg at 07/31/21 2108   nicotine (NICODERM CQ - dosed in mg/24 hours) patch 21 mg  21 mg Transdermal Q0600 Chalmers Guest, NP   21 mg at 07/31/21 0839   nitroGLYCERIN (NITROSTAT) SL tablet 0.4 mg  0.4 mg Sublingual Q5 min PRN Chalmers Guest, NP       pantoprazole (PROTONIX) EC tablet 40 mg  40 mg Oral Daily Chalmers Guest, NP   40 mg at 08/01/21 0729   polyethylene glycol (MIRALAX / GLYCOLAX) packet 17 g  17 g Oral BID PRN Chalmers Guest, NP       potassium chloride SA (KLOR-CON) CR tablet 20 mEq  20 mEq Oral BID Chalmers Guest, NP   20 mEq at 08/01/21 4132   Lab Results:  Results for orders placed or performed during the hospital encounter of 07/26/21 (from the past 48 hour(s))  Resp Panel by RT-PCR (Flu A&B, Covid)  Nasopharyngeal Swab     Status: None   Collection Time: 07/31/21  4:13 AM   Specimen: Nasopharyngeal Swab; Nasopharyngeal(NP) swabs in vial transport medium  Result Value Ref Range   SARS Coronavirus 2 by RT PCR NEGATIVE NEGATIVE    Comment: (NOTE) SARS-CoV-2 target nucleic acids are NOT DETECTED.  The SARS-CoV-2 RNA is generally detectable in upper respiratory specimens during the acute phase of infection. The lowest concentration of SARS-CoV-2 viral copies this assay can detect is 138 copies/mL. A negative result does not preclude SARS-Cov-2 infection and should not be used as the sole basis for treatment or other patient management decisions. A negative result may occur with  improper specimen collection/handling, submission of specimen other than nasopharyngeal swab, presence of viral mutation(s) within the areas targeted by this assay, and inadequate number of viral copies(<138 copies/mL). A negative result must be combined with clinical observations, patient history, and epidemiological information. The expected result is Negative.  Fact Sheet for Patients:  EntrepreneurPulse.com.au  Fact Sheet for Healthcare Providers:  IncredibleEmployment.be  This test is no t yet approved or cleared by the [place] FDA and  has been authorized for detection and/or diagnosis of SARS-CoV-2 by FDA under an Emergency Use Authorization (EUA). This EUA will remain  in effect (meaning this test can be used) for the duration of the COVID-19 declaration under Section 564(b)(1) of the Act, 21 U.S.C.section 360bbb-3(b)(1), unless the authorization is terminated  or revoked sooner.       Influenza A by PCR NEGATIVE NEGATIVE   Influenza B by PCR NEGATIVE NEGATIVE    Comment: (NOTE) The Xpert Xpress SARS-CoV-2/FLU/RSV plus assay is intended as an aid in the diagnosis of influenza from Nasopharyngeal swab specimens and should not be used  as a sole basis for  treatment. Nasal washings and aspirates are unacceptable for Xpert Xpress SARS-CoV-2/FLU/RSV testing.  Fact Sheet for Patients: EntrepreneurPulse.com.au  Fact Sheet for Healthcare Providers: IncredibleEmployment.be  This test is not yet approved or cleared by the [place] FDA and has been authorized for detection and/or diagnosis of SARS-CoV-2 by FDA under an Emergency Use Authorization (EUA). This EUA will remain in effect (meaning this test can be used) for the duration of the COVID-19 declaration under Section 564(b)(1) of the Act, 21 U.S.C. section 360bbb-3(b)(1), unless the authorization is terminated or revoked.  Performed at [facility], [place] [address], [place], [place] [address]    Blood Alcohol level:  Lab Results  Component Value Date   ETH <10 30/16/0109   Metabolic Disorder Labs: Lab Results  Component Value Date   HGBA1C 5.4 07/27/2021   MPG 108.28 07/27/2021   No results found for: PROLACTIN Lab Results  Component Value Date   CHOL 177 07/27/2021   TRIG 172 (H) 07/27/2021   HDL 45 07/27/2021   CHOLHDL 3.9 07/27/2021   VLDL 34 07/27/2021   LDLCALC 98 07/27/2021   LDLCALC 124 (H) 12/31/2015   Physical Findings: AIMS: Facial and Oral Movements Muscles of Facial Expression: None, normal Lips and Perioral Area: None, normal Jaw: None, normal Tongue: None, normal,Extremity Movements Upper (arms, wrists, hands, fingers): None, normal Lower (legs, knees, ankles, toes): None, normal, Trunk Movements Neck, shoulders, hips: None, normal, Overall Severity Severity of abnormal movements (highest score from questions above): None, normal Incapacitation due to abnormal movements: None, normal Patient's awareness of abnormal movements (rate only patient's report): No Awareness, Dental Status Current problems with teeth and/or dentures?: No Does patient usually wear dentures?: No   CIWA:    COWS:      Musculoskeletal: Strength & Muscle Tone: within normal limits Gait & Station: normal Patient leans: N/A  Psychiatric Specialty Exam:  Presentation  General Appearance: Appropriate for Environment; Casual; Fairly Groomed  Eye Contact:Good  Speech:Clear and Coherent; Normal Rate  Speech Volume:Normal  Handedness:Right  Mood and Affect  Mood:Depressed; Anxious  Affect:Congruent; Depressed  Thought Process  Thought Processes:Coherent; Linear  Descriptions of Associations:Tangential  Orientation:Full (Time, Place and Person)  Thought Content:Rumination; Tangential  History of Schizophrenia/Schizoaffective disorder:No  Duration of Psychotic Symptoms:N/A  Hallucinations:Hallucinations: None Description of Visual Hallucinations: NA  Ideas of Reference:None  Suicidal Thoughts:Suicidal Thoughts: No, patient denies   Homicidal Thoughts:Homicidal Thoughts: No, patient denies  Sensorium  Memory:Immediate Good; Recent Fair; Remote Fair  Judgment:Fair  Insight:Fair  Executive Functions  Concentration:Fair  Attention Span:Fair  [place]  Language:Good  Psychomotor Activity  Psychomotor Activity:Psychomotor Activity: Normal  Assets  Assets:Communication Skills; Desire for Improvement; Financial Resources/Insurance; Housing; Resilience; Social Support  Sleep  Sleep: Fair, 6.5 hours  Physical Exam: Physical Exam Vitals and nursing note reviewed.  HENT:     Nose: Nose normal.     Mouth/Throat:     Pharynx: Oropharynx is clear.  Eyes:     Pupils: Pupils are equal, round, and reactive to light.  Cardiovascular:     Rate and Rhythm: Normal rate.     Pulses: Normal pulses.  Pulmonary:     Effort: Pulmonary effort is normal.     Comments: Hx. COPD Genitourinary:    Comments: Deferred Musculoskeletal:        General: Normal range of motion.     Cervical back: Normal range of motion.  Skin:    General: Skin is warm  and  dry.  Neurological:     General: No focal deficit present.     Mental Status: She is alert and oriented to person, place, and time.  Psychiatric:        Attention and Perception: Attention normal. She does not perceive auditory or visual hallucinations.        Mood and Affect: Mood is anxious and depressed.        Speech: Speech normal.        Behavior: Behavior normal. Behavior is cooperative.        Thought Content: Thought content normal. Thought content is not paranoid or delusional. Thought content does not include homicidal or suicidal ideation. Thought content does not include homicidal or suicidal plan.        Cognition and Memory: Cognition normal.   Review of Systems  Constitutional:  Negative for chills and fever.  HENT:  Negative for congestion and sore throat.   Eyes:  Negative for blurred vision.  Respiratory:  Negative for cough, shortness of breath and wheezing.   Cardiovascular:  Negative for chest pain and palpitations.  Gastrointestinal:  Negative for abdominal pain, constipation, diarrhea, heartburn, nausea and vomiting.  Genitourinary:  Negative for dysuria and urgency.  Musculoskeletal:  Negative for joint pain and myalgias.  Neurological:  Negative for dizziness, tingling, tremors, sensory change, speech change, focal weakness, seizures, loss of consciousness, weakness and headaches.  Endo/Heme/Allergies:        Toradol. Buspirone  Lyrica Gabapentin Tramadol Zofran  Penicillins       Psychiatric/Behavioral:  Positive for depression ("Improving"). Negative for hallucinations (Hx. visual hallucinations.), memory loss, substance abuse and suicidal ideas. The patient is nervous/anxious. The patient does not have insomnia.    Blood pressure 111/72, pulse (!) 104, temperature 97.7 F (36.5 C), temperature source Oral, resp. rate 18, height 5\' 2"  (1.575 m), weight 91.6 kg, last menstrual period 09/12/2015, SpO2 96 %. Body mass index is 36.95 kg/m.  Treatment Plan  Summary: Daily contact with patient to assess and evaluate symptoms and progress in treatment and Medication management.   Continue inpatient hospitalization. Will continue today 08/01/2021 plan as below except where it is noted.   Diagnoses: Schizoaffective disorder, bipolar-type/r/o Schizophrenia/bipolar.  PTSD.    Other medical conditions.  COPD CHF   2. Medication management to reduce current symptoms to base line and improve the patient's overall level of functioning: See [facility] for plan of care.   Major depressive disorder with psychosis, R/o bipolar disorder/Schizophrenia.  -Increase Abilify from 10 mg to 15 mg po daily.  Patient received an additional 5 mg dose to make 15 mg on 11/21.     Anxiety.  -Continue Hydroxyzine 25 mg po tid prn.   Insomnia.  -Continue Melatonin 5 mg po Q hs.   3. Other medical conditions/ailments, continue:  Albuterol inhaler 2 puff Q 6 hrs prn for SOB. Albuterol nebulizing treatment 2.5 mg QQ 6 hrs prn for wheezing/SOB. Cipro 500 mg po bid x 7 days for uti. Estradiol 2 mg po daily for estrogen replacement. Furosemide 20 mg po daily for hx of CHF.Nicotine patch 21 mg topically Q 24 hrs for nicotine withdrawal. NTG 0.4 mg sublingually Q 5 minutes prn for chest pain. Protonix 40 mg po Q am for GERD. Miralax 17 g twice daily prn for constipation. Kdur 20 meq po bid for potassium replacement. Started on Lisinopril 5 mg po daily HTN.  Lisinopril 10 mg po x 1 (loading dose) for HTN today.   Continue  to encourage group attendance/participation. Discharge disposition in progress.  Ethelene Hal, NP 08/01/2021, 3:17 PM  Total Time Spent in Direct Patient Care:  I personally spent 30 minutes on the unit in direct patient care. The direct patient care time included face-to-face time with the patient, reviewing the patient's chart, communicating with other professionals, and coordinating care. Greater than 50% of this time was spent in counseling  or coordinating care with the patient regarding goals of hospitalization, psycho-education, and discharge planning needs.  I have independently evaluated the patient during a face-to-face assessment on 08/02/21. I reviewed the patient's chart, and I participated in key portions of the service. I discussed the case with the APP, and I agree with the assessment and plan of care as documented in the APP's note , as addended by me or notated below:  There is some improvement. Pt is slightly more focused but remains mostly tangential.  Tolerating medication well.   I agree with plan to increase Abilify to 15 mg once daily, starting on 08-01-2021.    Janine Limbo, MD Psychiatrist

## 2021-08-01 NOTE — B[facility] Group Notes (Signed)
Patient attended orientation and goals group. States goal is to discharge today. Orientation of unit and ward rules was discussed.  PsychoEd group was also completed - A poem was also read ''The Owl and the Chimp'' and how it relates to the brain and reactionary behaviors and internal conflict being normal and human. The poem helps identify tips to learn how to control our primitive scared brain and listen to our inner owl in times of stress. Pt attended and was appropriate.

## 2021-08-01 NOTE — Progress Notes (Signed)
Progress note    08/01/21 0800  Psych Admission Type (Psych Patients Only)  Admission Status Voluntary  Psychosocial Assessment  Patient Complaints Anxiety  Eye Contact Fair  Facial Expression Animated;Anxious  Affect Anxious;Appropriate to circumstance  Speech Logical/coherent  Interaction Assertive  Motor Activity Slow  Appearance/Hygiene Unremarkable  Behavior Characteristics Cooperative;Appropriate to situation;Anxious  Mood Anxious;Pleasant  Thought Process  Coherency WDL  Content WDL  Delusions WDL  Perception WDL  Hallucination None reported or observed  Judgment WDL  Confusion None  Danger to Self  Current suicidal ideation? Denies  Self-Injurious Behavior No self-injurious ideation or behavior indicators observed or expressed   Danger to Others  Danger to Others None reported or observed

## 2021-08-01 NOTE — Plan of Care (Signed)
  Problem: Education: Goal: Ability to state activities that reduce stress will improve Outcome: Progressing   Problem: Education: Goal: Utilization of techniques to improve thought processes will improve Outcome: Progressing Goal: Knowledge of the prescribed therapeutic regimen will improve Outcome: Progressing

## 2021-08-01 NOTE — BHH Counselor (Signed)
CSW met with patient who reports that her daughter called down to the court and they never received the court letter to excuse her from court.  CSW called the criminal court office to clarify needing a letter.  CSW resent the letter by both fax and email. Sent to email: [email or web address] and faxed to [phone].   Jenise, court clerk, confirmed receipt of letter.   Jenise provided instruction for patient to call when discharged on 08/03/2021 to check on status of court case.  CSW relayed information to patient with appropriate contact numbers to call.    Saphyra Hutt, LCSW, [place] Social Worker  [facility]

## 2021-08-01 NOTE — Progress Notes (Signed)
   08/01/21 2200  Psych Admission Type (Psych Patients Only)  Admission Status Voluntary  Psychosocial Assessment  Patient Complaints None  Eye Contact Fair  Facial Expression Animated;Anxious  Affect Anxious;Appropriate to circumstance  Speech Logical/coherent  Interaction Assertive  Motor Activity Slow  Appearance/Hygiene Unremarkable  Behavior Characteristics Cooperative;Appropriate to situation  Mood Pleasant  Thought Process  Coherency WDL  Content WDL  Delusions None reported or observed  Perception WDL  Hallucination None reported or observed  Judgment WDL  Confusion None  Danger to Self  Current suicidal ideation? Denies  Danger to Others  Danger to Others None reported or observed   Pt says she is doing well considering her circumstances. She says everything that has happened to her has occurred within the last month."I am making it by the grace of God. He is helping me." Pt denies anxiety and depression. Says she has some pain in her lower back but she doesn't want anything for it. Pt denies SI, HI, AVH.

## 2021-08-01 NOTE — B[facility] Group Notes (Signed)
Adult Psychoeducational Group Note  Date:  08/01/2021 Time:  1:50 PM  Group Topic/Focus:  Making Healthy Choices:   The focus of this group is to help patients identify negative/unhealthy choices they were using prior to admission and identify positive/healthier coping strategies to replace them upon discharge.  Participation Level:  Active  Participation Quality:  Appropriate  Affect:  Appropriate  Cognitive:  Appropriate  Insight: Appropriate  Engagement in Group:  Engaged  Modes of Intervention:  Discussion  Additional Comments: Patient attended Nurse led Psycho Ed. Group and participated.    Cystal Shannahan W [place] 72/82/0601, 1:50 PM

## 2021-08-01 NOTE — B[facility] Group Notes (Signed)
Pt attended and participated in relaxation group.  

## 2021-08-01 NOTE — Group Note (Signed)
Occupational Therapy Group Note  Group Topic: Sensory Modulation  Group Date: 08/01/2021 Start Time: 1400 End Time: 1445 Facilitators: Ponciano Ort, OT/L   Group Description: Group encouraged increased engagement and participation through discussion focused on sensory modulation and self-soothing through use of the 8 senses. Discussion introduced the concept of sensory modulation and integration, focusing on how we can utilize our body and it's senses to self-soothe or cope, when we are experiencing an over or under-whelming sensation or feeling. Group members were introduced to a sensory diet checklist as a helpful tool/resource that can be utilized to identify what activities and strategies we prefer and do not prefer based upon our response to different stimulus. The concept of alerting vs calming activities was also introduced to understand how to counteract how we are feeling (Example: when we are feeling overwhelmed/stressed, engage in something calming. When we are feeling depressed/low energy, engage in something alerting). Group members engaged actively in discussion sharing their own personal sensory likes/dislikes.      Therapeutic Goal(s):  Identify self-soothing calming vs alerting activities   Identify and create a sensory diet to assist in self-soothing sensory strategies    Participation Level: Did not attend   Plan: Continue to engage patient in OT groups 2 - 3x/week.  08/01/2021  Ponciano Ort, OT/L

## 2021-08-01 NOTE — [facility] Group Notes (Signed)
Spiritual care group on grief and loss facilitated by chaplain Janne Napoleon, [facility]   Group Goal:   Support / Education around grief and loss   Members engage in facilitated group support and psycho-social education.   Group Description:   Following introductions and group rules, group members engaged in facilitated group dialog and support around topic of loss, with particular support around experiences of loss in their lives. Group Identified types of loss (relationships / self / things) and identified patterns, circumstances, and changes that precipitate losses. Reflected on thoughts / feelings around loss, normalized grief responses, and recognized variety in grief experience. Group noted Worden's four tasks of grief in discussion.   Group drew on Adlerian / Rogerian, narrative, MI,   Patient Progress: Lynn Logan attended group and was actively engaged in the conversation.  She shared about the loss of her fiance as well as the loss of a child in utero.  She interacted with her peers in a meaningful way.  Brutus, Milo Pager, [phone] 9:52 PM

## 2021-08-01 NOTE — BH IP Treatment Plan (Signed)
Interdisciplinary Treatment and Diagnostic Plan Update  08/01/2021 Time of Session: 9:15am Lynn Logan MRN: [id number]  Principal Diagnosis: Bipolar I disorder, current or most recent episode depressed, with psychotic features ([place])  Secondary Diagnoses: Principal Problem:   Bipolar I disorder, current or most recent episode depressed, with psychotic features ([place]) Active Problems:   Diastolic CHF ([place])   COPD (chronic obstructive pulmonary disease) ([place])   Tobacco user   PTSD (post-traumatic stress disorder)   Current Medications:  Current Facility-Administered Medications  Medication Dose Route Frequency Provider Last Rate Last Admin   acetaminophen (TYLENOL) tablet 650 mg  650 mg Oral Q6H PRN Chalmers Guest, NP   650 mg at 07/31/21 1701   albuterol (PROVENTIL) (2.5 MG/3ML) 0.083% nebulizer solution 2.5 mg  2.5 mg Nebulization Q6H PRN Nwoko, Herbert Pun I, NP       albuterol (VENTOLIN HFA) 108 (90 Base) MCG/ACT inhaler 2 puff  2 puff Inhalation Q6H PRN Nwoko, Herbert Pun I, NP       alum & mag hydroxide-simeth (MAALOX/MYLANTA) 200-200-20 MG/5ML suspension 30 mL  30 mL Oral Q4H PRN Chalmers Guest, NP       ARIPiprazole (ABILIFY) tablet 10 mg  10 mg Oral Daily Lindell Spar I, NP   10 mg at 08/01/21 2505   ciprofloxacin (CIPRO) tablet 500 mg  500 mg Oral BID Lindell Spar I, NP   500 mg at 08/01/21 3976   estradiol (ESTRACE) tablet 2 mg  2 mg Oral Daily Chalmers Guest, NP   2 mg at 08/01/21 7341   furosemide (LASIX) tablet 20 mg  20 mg Oral Daily Chalmers Guest, NP   20 mg at 08/01/21 9379   hydrOXYzine (ATARAX/VISTARIL) tablet 25 mg  25 mg Oral TID PRN Chalmers Guest, NP   25 mg at 07/31/21 2108   lisinopril (ZESTRIL) tablet 5 mg  5 mg Oral Daily Lindell Spar I, NP   5 mg at 08/01/21 0729   magnesium hydroxide (MILK OF MAGNESIA) suspension 30 mL  30 mL Oral Daily PRN Chalmers Guest, NP       melatonin tablet 5 mg  5 mg Oral QHS Massengill, Ovid Curd, MD   5 mg at 07/31/21 2108   nicotine  (NICODERM CQ - dosed in mg/24 hours) patch 21 mg  21 mg Transdermal Q0600 Chalmers Guest, NP   21 mg at 07/31/21 0839   nitroGLYCERIN (NITROSTAT) SL tablet 0.4 mg  0.4 mg Sublingual Q5 min PRN Chalmers Guest, NP       pantoprazole (PROTONIX) EC tablet 40 mg  40 mg Oral Daily Chalmers Guest, NP   40 mg at 08/01/21 0729   polyethylene glycol (MIRALAX / GLYCOLAX) packet 17 g  17 g Oral BID PRN Chalmers Guest, NP       potassium chloride SA (KLOR-CON) CR tablet 20 mEq  20 mEq Oral BID Chalmers Guest, NP   20 mEq at 08/01/21 0240   PTA Medications: Medications Prior to Admission  Medication Sig Dispense Refill Last Dose   albuterol (PROVENTIL HFA;VENTOLIN HFA) 108 (90 Base) MCG/ACT inhaler Inhale 2 puffs into the lungs every 6 (six) hours as needed.      albuterol (PROVENTIL) (2.5 MG/3ML) 0.083% nebulizer solution Take 3 mLs (2.5 mg total) by nebulization every 6 (six) hours as needed for wheezing or shortness of breath. 150 mL 1    amitriptyline (ELAVIL) 25 MG tablet Take 25 mg by mouth at bedtime.  budesonide-formoterol (SYMBICORT) 160-4.5 MCG/ACT inhaler Inhale 2 puffs into the lungs 2 (two) times daily.      cephALEXin (KEFLEX) 500 MG capsule Take 500 mg by mouth 3 (three) times daily. (Patient not taking: No sig reported)      clonazePAM (KLONOPIN) 0.5 MG tablet Take 0.5 mg by mouth 2 (two) times daily.      cyclobenzaprine (FLEXERIL) 10 MG tablet Take 1 tablet by mouth at bedtime. (Patient not taking: Reported on 07/25/2021)      dicyclomine (BENTYL) 20 MG tablet Take by mouth. (Patient not taking: No sig reported)      estradiol (ESTRACE) 2 MG tablet Take 1 tablet (2 mg total) by mouth daily. 30 tablet 11    famotidine (PEPCID) 20 MG tablet Take 20 mg by mouth 2 (two) times daily. (Patient not taking: No sig reported)      ferrous sulfate 324 (65 Fe) MG TBEC Take 1 tablet (325 mg total) by mouth daily. 30 tablet 3    furosemide (LASIX) 20 MG tablet Take 20 mg by mouth daily.       HYDROcodone-acetaminophen (NORCO/VICODIN) 5-325 MG tablet Take 1 tablet by mouth every 6 (six) hours as needed for moderate pain (Must last 30 days). (Patient not taking: No sig reported) 45 tablet 0    hydrOXYzine (ATARAX/VISTARIL) 25 MG tablet Take 1 tablet by mouth at bedtime. (Patient not taking: Reported on 07/25/2021)      hydrOXYzine (VISTARIL) 50 MG capsule       megestrol (MEGACE) 40 MG tablet Take 1 tablet (40 mg total) by mouth daily. 90 tablet 3    megestrol (MEGACE) 40 MG tablet Take 1 tablet by mouth daily. (Patient not taking: No sig reported)      Multiple Vitamin (MULTI-VITAMIN) tablet Take 1 tablet by mouth daily.      naproxen (NAPROSYN) 500 MG tablet Take 1 tablet (500 mg total) by mouth 2 (two) times daily with a meal. 60 tablet 5    nitroGLYCERIN (NITROSTAT) 0.4 MG SL tablet Place 0.4 mg under the tongue every 5 (five) minutes as needed.      Omega-3 Fatty Acids (FISH OIL) 1000 MG CAPS  (Patient not taking: No sig reported)      omeprazole (PRILOSEC) 40 MG capsule Take 1 capsule by mouth daily.      pantoprazole (PROTONIX) 40 MG tablet Take 40 mg by mouth daily.      polyethylene glycol powder (GLYCOLAX/MIRALAX) powder Take 17 g by mouth 2 (two) times daily as needed. 3350 g 1    potassium chloride SA (K-DUR,KLOR-CON) 20 MEQ tablet Take 2 tablets (40 mEq total) by mouth 2 (two) times daily. 28 tablet 0    pregabalin (LYRICA) 300 MG capsule Take 1 capsule (300 mg total) by mouth 2 (two) times daily. 60 capsule 1    promethazine (PHENERGAN) 25 MG tablet Take 1 tablet (25 mg total) by mouth every 6 (six) hours as needed. (Patient not taking: No sig reported) 12 tablet 1    RaNITidine HCl (ZANTAC PO) Take 75 mg by mouth daily as needed (acid reflux).  (Patient not taking: Reported on 07/25/2021)      RESTASIS 0.05 % ophthalmic emulsion 1 drop 2 (two) times daily.      sertraline (ZOLOFT) 100 MG tablet Take 1 tablet (100 mg total) by mouth daily. 30 tablet 1    sertraline (ZOLOFT)  100 MG tablet  (Patient not taking: No sig reported)       Patient  Stressors: Financial difficulties   Health problems   Marital or family conflict   Medication change or noncompliance   Traumatic event    Patient Strengths: Average or above average intelligence  Communication skills  General fund of knowledge  Motivation for treatment/growth  Physical Health  Supportive family/friends   Treatment Modalities: Medication Management, Group therapy, Case management,  1 to 1 session with clinician, Psychoeducation, Recreational therapy.   Physician Treatment Plan for Primary Diagnosis: Bipolar I disorder, current or most recent episode depressed, with psychotic features ([place]) Long Term Goal(s): Improvement in symptoms so as ready for discharge   Short Term Goals: Ability to identify changes in lifestyle to reduce recurrence of condition will improve Ability to verbalize feelings will improve Ability to disclose and discuss suicidal ideas Ability to demonstrate self-control will improve Ability to identify and develop effective coping behaviors will improve Ability to maintain clinical measurements within normal limits will improve Compliance with prescribed medications will improve Ability to identify triggers associated with substance abuse/mental health issues will improve  Medication Management: Evaluate patient's response, side effects, and tolerance of medication regimen.  Therapeutic Interventions: 1 to 1 sessions, Unit Group sessions and Medication administration.  Evaluation of Outcomes: Progressing  Physician Treatment Plan for Secondary Diagnosis: Principal Problem:   Bipolar I disorder, current or most recent episode depressed, with psychotic features ([place]) Active Problems:   Diastolic CHF ([place])   COPD (chronic obstructive pulmonary disease) ([place])   Tobacco user   PTSD (post-traumatic stress disorder)  Long Term Goal(s): Improvement in symptoms so as ready for  discharge   Short Term Goals: Ability to identify changes in lifestyle to reduce recurrence of condition will improve Ability to verbalize feelings will improve Ability to disclose and discuss suicidal ideas Ability to demonstrate self-control will improve Ability to identify and develop effective coping behaviors will improve Ability to maintain clinical measurements within normal limits will improve Compliance with prescribed medications will improve Ability to identify triggers associated with substance abuse/mental health issues will improve     Medication Management: Evaluate patient's response, side effects, and tolerance of medication regimen.  Therapeutic Interventions: 1 to 1 sessions, Unit Group sessions and Medication administration.  Evaluation of Outcomes: Progressing   RN Treatment Plan for Primary Diagnosis: Bipolar I disorder, current or most recent episode depressed, with psychotic features ([place]) Long Term Goal(s): Knowledge of disease and therapeutic regimen to maintain health will improve  Short Term Goals: Ability to remain free from injury will improve, Ability to verbalize frustration and anger appropriately will improve, Ability to demonstrate self-control, Ability to identify and develop effective coping behaviors will improve, and Compliance with prescribed medications will improve  Medication Management: RN will administer medications as ordered by provider, will assess and evaluate patient's response and provide education to patient for prescribed medication. RN will report any adverse and/or side effects to prescribing provider.  Therapeutic Interventions: 1 on 1 counseling sessions, Psychoeducation, Medication administration, Evaluate responses to treatment, Monitor vital signs and CBGs as ordered, Perform/monitor CIWA, COWS, AIMS and Fall Risk screenings as ordered, Perform wound care treatments as ordered.  Evaluation of Outcomes: Progressing   LCSW  Treatment Plan for Primary Diagnosis: Bipolar I disorder, current or most recent episode depressed, with psychotic features ([place]) Long Term Goal(s): Safe transition to appropriate next level of care at discharge, Engage patient in therapeutic group addressing interpersonal concerns.  Short Term Goals: Engage patient in aftercare planning with referrals and resources, Increase social support, Increase ability to appropriately verbalize  feelings, Identify triggers associated with mental health/substance abuse issues, and Increase skills for wellness and recovery  Therapeutic Interventions: Assess for all discharge needs, 1 to 1 time with Social worker, Explore available resources and support systems, Assess for adequacy in community support network, Educate family and significant other(s) on suicide prevention, Complete Psychosocial Assessment, Interpersonal group therapy.  Evaluation of Outcomes: Progressing   Progress in Treatment: Attending groups: Yes. Participating in groups: Yes. Taking medication as prescribed: Yes. Toleration medication: Yes. Family/Significant other contact made: Yes, individual(s) contacted:  Son Patient understands diagnosis: Yes. Discussing patient identified problems/goals with staff: Yes. Medical problems stabilized or resolved: Yes. Denies suicidal/homicidal ideation: Yes. Issues/concerns per patient self-inventory: No.     New problem(s) identified: No, Describe:  None    New Short Term/Long Term Goal(s): medication stabilization, elimination of SI thoughts, development of comprehensive mental wellness plan.    Patient Goals: "To go home, to court on Monday, and to open heart surgery on Tuesday"   Discharge Plan or Barriers: Patient is to return to stay with daughter at discharge. She is to follow up with [facility] for medication management and therapy.   Reason for Continuation of Hospitalization: Depression Medication stabilization   Estimated Length  of Stay: 1-3 days   Scribe for Treatment Team: Vassie Moselle, LCSW 08/01/2021 10:14 AM

## 2021-08-02 MED ORDER — ARIPIPRAZOLE 10 MG PO TABS
20.0000 mg | ORAL_TABLET | Freq: Every day | ORAL | Status: DC
  Administered 2021-08-03 – 2021-08-04 (×2): 20 mg via ORAL
  Filled 2021-08-02 (×2): qty 14
  Filled 2021-08-02 (×2): qty 2

## 2021-08-02 MED ORDER — DOCUSATE SODIUM 100 MG PO CAPS
100.0000 mg | ORAL_CAPSULE | Freq: Every day | ORAL | Status: DC
  Administered 2021-08-03 – 2021-08-04 (×2): 100 mg via ORAL
  Filled 2021-08-02: qty 1
  Filled 2021-08-02 (×2): qty 7
  Filled 2021-08-02: qty 1

## 2021-08-02 MED ORDER — ARIPIPRAZOLE 5 MG PO TABS
5.0000 mg | ORAL_TABLET | Freq: Once | ORAL | Status: AC
  Administered 2021-08-02: 5 mg via ORAL
  Filled 2021-08-02 (×2): qty 1

## 2021-08-02 NOTE — Progress Notes (Signed)
   08/02/21 0747  Psych Admission Type (Psych Patients Only)  Admission Status Voluntary  Psychosocial Assessment  Patient Complaints None  Eye Contact Fair  Facial Expression Animated;Anxious  Affect Anxious;Appropriate to circumstance  Speech Logical/coherent  Interaction Assertive  Motor Activity Slow  Appearance/Hygiene Unremarkable  Behavior Characteristics Cooperative  Mood Pleasant  Thought Process  Coherency WDL  Content WDL  Delusions None reported or observed  Perception WDL  Hallucination None reported or observed  Judgment WDL  Confusion None  Danger to Self  Current suicidal ideation? Denies  Danger to Others  Danger to Others None reported or observed   D: Patient denies SI/HI/AVH. Patient denied both anxiety and depression. Pt. Out in open areas and has been social with peers and staff. A:  Patient took scheduled medicine.  Support and encouragement provided Routine safety checks conducted every 15 minutes. Patient  Informed to notify staff with any concerns.   R:  Safety maintained.

## 2021-08-02 NOTE — Group Note (Signed)
Date:  08/02/2021 Time:  10:13 AM  Group Topic/Focus:  Orientation:   The focus of this group is to educate the patient on the purpose and policies of crisis stabilization and provide a format to answer questions about their admission.  The group details unit policies and expectations of patients while admitted.    Participation Level:  Minimal  Participation Quality:  Appropriate  Affect:  Appropriate  Cognitive:  Appropriate  Insight: Appropriate  Engagement in Group:  Engaged  Modes of Intervention:  Discussion  Additional Comments:  Pt plans to talk to the doctor about going home before Thanksgiving. Garvin Fila 08/02/2021, 10:13 AM

## 2021-08-02 NOTE — Progress Notes (Addendum)
[facility] MD Progress Note  08/02/2021 1:48 PM Lynn Logan  MRN:  [id number]  Subjective: Patient seen during this encounter in the office on the 300 hall. Pt states: "I am doing excellent. I have no thoughts of suicide or anything like that.  We already got another place to go. I want to go home tomorrow to spend time with my family before thanksgiving. DSS and the [employer] will help with turning on the gas and paying the deposit".   Daily notes for today (08/02/21):  Patient is seen, chart reviewed and case discussed with the treatment team. She is alert & oriented to person, place and situation. Attention to personal hygiene and grooming is fair. Pt is calm and cooperative, and maintains good eye contact through encounter. Pt's speech is clear and coherent, but thoughts are tangential, with flight of ideas, and she sounds illogical at times, as she rambled from topic to topic.   Pt ruminates about getting discharged tomorrow so that she can go spend time with her children here in [place], then states she will go to [place] to be with her son and his family for a few days, then states she will go to [employer] where her fiance is buried to spend as much time as she wants on his grave because she currently misses him so much. Pt then states her fiance and her were supposed to get married "that following Monday, but it never happened".  Pt added that she will be going to a "grieving class", and will keep all of the appointments once she gets discharged. Pt mentioned that she and her children have to be out of where she currently lives by 08/11/21, then stated that her son will be coming from [place] every day before lunch to give her medications because she does not want her daughter handling her medications. She then talked about walking into [facility] after discharge, and states that she wants for her social worker to call her son to make arrangements so that she can be picked up by him after  discharge. Pt educated that discharge planning is still ongoing, but that Education officer, museum will be notified of her request.  Pt reports that her sleep quality has been "great", and added: "I slept all night until ten minutes before I was supposed to get up".  As per flow sheets, pt slept a total of 6 hours last night. Pt reports having a normal energy level, reports a good appetite, states she is eating breakfast, lunch, dinner, and snacks in between. Pt denies suicidal ideations, denies homicidal ideations, denies auditory or visual hallucinations, and denies paranoia. Pt states she was paranoid prior to hospitalization, but no longer feels that way. There are no evidences of delusional thoughts at this time.   Pt reports that she is tolerating her medications well, denies any numbness, tingling, stiffness, or pain in any of her extremities. Pt is ambulating with a steady gait. Pt took Abilify 15 mg in the morning. NP and Dr Caswell Corwin discussed with pt the need to increase the dose of her Abilify from 15 mg to 20 mg daily to better manage her depressive symptoms. Pt was agreeable with plan, and an additional Abilify 5 mg was ordered for today, to make it a total of 20mg  Abilify today.  Abilify 20mg  daily ordered to start tomorrow morning. Pt's only complaint was that she is straining to have a bowel movement. She however reports that she did have a bowel movement yesterday. Colace  100 mg daily ordered to start tomorrow morning. Pt denies being in any physical pain, denies any other concerns, and agrees to seek staff assistance on the unit as needed. Pt is being monitored Q15 minutes for safety, and verbally contracts for safety on the unit. V/S are within normal limits. Pt has been visible on the unit interacting with other patients, and attending groups and unit activities.  Objective: Patient is a 50 year old Caucasian female with prior hx of mental illness/psychiatric admissions. She has in the past been  admitted to the [facility] psychiatric ward in [place] just of recent, at the [facility] She was receiving mental health care on an outpatient basis at the [facility] in [facility] with Dr. Sheralyn Boatman. She is currently admitted to the [facility] from the [facility] with complain of worsening suicidal ideation with plan & an attempt to cut her wrists. Patient also admitted to the [facility] staff that she was also seeing her dead fiance in her kitchen.  Principal Problem: Bipolar I disorder, current or most recent episode depressed, with psychotic features ([place])  Diagnosis: Principal Problem:   Bipolar I disorder, current or most recent episode depressed, with psychotic features ([place]) Active Problems:   Diastolic CHF ([place])   COPD (chronic obstructive pulmonary disease) ([place])   Tobacco user   PTSD (post-traumatic stress disorder)    Past Psychiatric History: Major depressive disorder, PTSD  Past Medical History:  Past Medical History:  Diagnosis Date   Anxiety    Arthritis    Rheumatoid   Asthma    Breast cancer ([place])    CHF (congestive heart failure) (HCC)    Chronic back pain    Colon cancer ([place])    COPD (chronic obstructive pulmonary disease) ([place])    Depression    Fibromyalgia    Gout    Heart attack ([place]) 2017   Renal disorder    Right lumbar radiculopathy    Vitiligo    face    Past Surgical History:  Procedure Laterality Date   CESAREAN SECTION     DILITATION & CURRETTAGE/HYSTROSCOPY WITH NOVASURE ABLATION N/A 07/07/2015   Procedure: HYSTEROSCOPY, UTERINE CURETTAGE, ENDOMETRIAL  ABLATION Uterine Cavity Length=6.5cm Uterine Cavity Width=4.5cm Power=161 Watts Time=1 minute 19 seconds;  Surgeon: Florian Buff, MD;  Location: [facility];  Service: Gynecology;  Laterality: N/A;   POLYPECTOMY  07/07/2015   Procedure: POLYPECTOMY;  Surgeon: Florian Buff, MD;  Location: [facility];  Service: Gynecology;;   SALPINGOOPHORECTOMY Bilateral 10/13/2015   Procedure: BILATERAL  SALPINGO OOPHORECTOMY;  Surgeon: Florian Buff, MD;  Location: [facility];  Service: Gynecology;  Laterality: Bilateral;   TUBAL LIGATION     VAGINAL HYSTERECTOMY N/A 10/13/2015   Procedure: HYSTERECTOMY VAGINAL;  Surgeon: Florian Buff, MD;  Location: [facility];  Service: Gynecology;  Laterality: N/A;   Family History:  Family History  Problem Relation Age of Onset   Diabetes Mother    Congestive Heart Failure Mother    Depression Father    Hypertension Father    Cancer Father    Heart disease Maternal Uncle    Depression Maternal Grandmother    Family Psychiatric  History: See H&P.  Social History:  Social History   Substance and Sexual Activity  Alcohol Use No   Alcohol/week: 0.0 standard drinks     Social History   Substance and Sexual Activity  Drug Use No   Comment: Hx of marijuana use - None now  Social History   Socioeconomic History   Marital status: Divorced    Spouse name: Not on file   Number of children: 2   Years of education: 11   Highest education level: 11th grade  Occupational History   Not on file  Tobacco Use   Smoking status: Never    Passive exposure: Never   Smokeless tobacco: Never   Tobacco comments:    Patient denied use of tobacco  Substance and Sexual Activity   Alcohol use: No    Alcohol/week: 0.0 standard drinks   Drug use: No    Comment: Hx of marijuana use - None now   Sexual activity: Not Currently    Birth control/protection: Surgical  Other Topics Concern   Not on file  Social History Narrative   Lives with daughter in [place], [place].  Denied use of tobacco, alcohol, all drug use.   Social Determinants of Health   Financial Resource Strain: Not on file  Food Insecurity: Not on file  Transportation Needs: Not on file  Physical Activity: Not on file  Stress: Not on file  Social Connections: Not on file   Additional Social History:   Sleep: Good  Appetite:  Good  Current Medications: Current Facility-Administered  Medications  Medication Dose Route Frequency Provider Last Rate Last Admin   acetaminophen (TYLENOL) tablet 650 mg  650 mg Oral Q6H PRN Chalmers Guest, NP   650 mg at 07/31/21 1701   albuterol (PROVENTIL) (2.5 MG/3ML) 0.083% nebulizer solution 2.5 mg  2.5 mg Nebulization Q6H PRN Nwoko, Herbert Pun I, NP       albuterol (VENTOLIN HFA) 108 (90 Base) MCG/ACT inhaler 2 puff  2 puff Inhalation Q6H PRN Nwoko, Herbert Pun I, NP       alum & mag hydroxide-simeth (MAALOX/MYLANTA) 200-200-20 MG/5ML suspension 30 mL  30 mL Oral Q4H PRN Chalmers Guest, NP       [START ON 08/03/2021] ARIPiprazole (ABILIFY) tablet 20 mg  20 mg Oral Daily Ethelene Hal, NP       ciprofloxacin (CIPRO) tablet 500 mg  500 mg Oral BID Lindell Spar I, NP   500 mg at 08/02/21 0747   [START ON 08/03/2021] docusate sodium (COLACE) capsule 100 mg  100 mg Oral Daily Nkwenti, Doris, NP       estradiol (ESTRACE) tablet 2 mg  2 mg Oral Daily Chalmers Guest, NP   2 mg at 08/02/21 0747   furosemide (LASIX) tablet 20 mg  20 mg Oral Daily Chalmers Guest, NP   20 mg at 08/02/21 0747   hydrOXYzine (ATARAX/VISTARIL) tablet 25 mg  25 mg Oral TID PRN Chalmers Guest, NP   25 mg at 08/01/21 2158   lisinopril (ZESTRIL) tablet 5 mg  5 mg Oral Daily Lindell Spar I, NP   5 mg at 08/02/21 0747   magnesium hydroxide (MILK OF MAGNESIA) suspension 30 mL  30 mL Oral Daily PRN Chalmers Guest, NP       melatonin tablet 5 mg  5 mg Oral QHS Yusef Lamp, MD   5 mg at 08/01/21 2158   nicotine (NICODERM CQ - dosed in mg/24 hours) patch 21 mg  21 mg Transdermal Q0600 Chalmers Guest, NP   21 mg at 08/02/21 0557   nitroGLYCERIN (NITROSTAT) SL tablet 0.4 mg  0.4 mg Sublingual Q5 min PRN Chalmers Guest, NP       pantoprazole (PROTONIX) EC tablet 40 mg  40 mg Oral Daily Pecolia Ades  R, NP   40 mg at 08/02/21 0747   polyethylene glycol (MIRALAX / GLYCOLAX) packet 17 g  17 g Oral BID PRN Chalmers Guest, NP       potassium chloride SA (KLOR-CON) CR tablet 20 mEq  20 mEq  Oral BID Chalmers Guest, NP   20 mEq at 08/02/21 0747   Lab Results:  No results found for this or any previous visit (from the past 48 hour(s)).  Blood Alcohol level:  Lab Results  Component Value Date   ETH <10 70/35/0093   Metabolic Disorder Labs: Lab Results  Component Value Date   HGBA1C 5.4 07/27/2021   MPG 108.28 07/27/2021   No results found for: PROLACTIN Lab Results  Component Value Date   CHOL 177 07/27/2021   TRIG 172 (H) 07/27/2021   HDL 45 07/27/2021   CHOLHDL 3.9 07/27/2021   VLDL 34 07/27/2021   LDLCALC 98 07/27/2021   LDLCALC 124 (H) 12/31/2015   Physical Findings: AIMS: Facial and Oral Movements Muscles of Facial Expression: None, normal Lips and Perioral Area: None, normal Jaw: None, normal Tongue: None, normal,Extremity Movements Upper (arms, wrists, hands, fingers): None, normal Lower (legs, knees, ankles, toes): None, normal, Trunk Movements Neck, shoulders, hips: None, normal, Overall Severity Severity of abnormal movements (highest score from questions above): None, normal Incapacitation due to abnormal movements: None, normal Patient's awareness of abnormal movements (rate only patient's report): No Awareness, Dental Status Current problems with teeth and/or dentures?: No Does patient usually wear dentures?: No   CIWA:    COWS:     Musculoskeletal: Strength & Muscle Tone: within normal limits Gait & Station: normal Patient leans: N/A  Psychiatric Specialty Exam:  Presentation  General Appearance: Appropriate for Environment; Fairly Groomed  Eye Contact:Fair  Speech:Normal Rate  Speech Volume:Normal  Handedness:Right  Mood and Affect  Mood:Depressed (improving with medications)  Affect: constricted   Thought Process  Thought Processes: more linear  Descriptions of Associations: Less Tangential  Orientation:Full (Time, Place and Person)  Thought Content:Less Tangential  History of Schizophrenia/Schizoaffective  disorder:No  Duration of Psychotic Symptoms:Greater than six months  Hallucinations:Hallucinations: None  Ideas of Reference:None  Suicidal Thoughts:Suicidal Thoughts: No, patient denies   Homicidal Thoughts:Homicidal Thoughts: No, patient denies  Sensorium  Memory:Immediate Good  Judgment:Fair  Insight:improving   Executive Functions  Concentration:Good  Attention Span:Good  [place]  Language:Good  Psychomotor Activity  Psychomotor Activity:Psychomotor Activity: Normal  Assets  Assets:Financial Resources/Insurance  Sleep  Sleep: Fair, 6.5 hours  Physical Exam: Physical Exam Vitals and nursing note reviewed.  HENT:     Nose: Nose normal.     Mouth/Throat:     Pharynx: Oropharynx is clear.  Eyes:     Pupils: Pupils are equal, round, and reactive to light.  Cardiovascular:     Rate and Rhythm: Normal rate.     Pulses: Normal pulses.  Pulmonary:     Effort: Pulmonary effort is normal.     Comments: Hx. COPD Genitourinary:    Comments: Deferred Musculoskeletal:        General: Normal range of motion.     Cervical back: Normal range of motion.  Skin:    General: Skin is warm and dry.  Neurological:     General: No focal deficit present.     Mental Status: She is alert and oriented to person, place, and time.  Psychiatric:        Attention and Perception: Attention normal. She does not perceive auditory  or visual hallucinations.        Mood and Affect: Mood is anxious and depressed.        Speech: Speech normal.        Behavior: Behavior normal. Behavior is cooperative.        Thought Content: Thought content normal. Thought content is not paranoid or delusional. Thought content does not include homicidal or suicidal ideation. Thought content does not include homicidal or suicidal plan.        Cognition and Memory: Cognition normal.   Review of Systems  Constitutional:  Negative for chills, diaphoresis, fever and  malaise/fatigue.  HENT:  Negative for congestion and sore throat.   Eyes:  Negative for blurred vision.  Respiratory:  Negative for cough, shortness of breath and wheezing.   Cardiovascular:  Negative for chest pain and palpitations.  Gastrointestinal:  Negative for abdominal pain, constipation, diarrhea, heartburn, nausea and vomiting.  Genitourinary:  Negative for dysuria and urgency.  Musculoskeletal:  Negative for joint pain and myalgias.  Skin:  Negative for itching and rash.  Neurological:  Negative for dizziness, tingling, tremors, sensory change, speech change, focal weakness, seizures, loss of consciousness, weakness and headaches.  Endo/Heme/Allergies:        Toradol. Buspirone  Lyrica Gabapentin Tramadol Zofran  Penicillins       Psychiatric/Behavioral:  Positive for depression (Improving with medications). Negative for hallucinations (Hx. visual hallucinations.), memory loss, substance abuse and suicidal ideas. The patient is nervous/anxious. The patient does not have insomnia.    Blood pressure 121/73, pulse 96, temperature 97.8 F (36.6 C), temperature source Oral, resp. rate 18, height 5\' 2"  (1.575 m), weight 91.6 kg, last menstrual period 09/12/2015, SpO2 93 %. Body mass index is 36.95 kg/m.  Treatment Plan Summary: Daily contact with patient to assess and evaluate symptoms and progress in treatment and Medication management.   Continue inpatient hospitalization. Will continue today 08/02/2021 plan as below except where it is noted.   Diagnoses: Schizoaffective disorder, bipolar-type/r/o Schizophrenia/bipolar.  PTSD.    Other medical conditions.  COPD CHF   2. Medication management to reduce current symptoms to base line and improve the patient's overall level of functioning: See MAR for complete med list.   Major depressive disorder with psychosis, R/o bipolar disorder/Schizophrenia.  -Increase Abilify from 15 mg to 20 mg po daily. For mood stability.   Patient received an additional 5 mg dose to make 20 mg on 11/22.     Anxiety.  -Continue Hydroxyzine 25 mg po tid prn.   Insomnia.  -Continue Melatonin 5 mg po Q hs.  Constipation -Start Colace 100mg  Daily for constipation (Start 11/23 @ 0800)   3. Other medical conditions/ailments, continue:  Albuterol inhaler 2 puff Q 6 hrs prn for SOB. Albuterol nebulizing treatment 2.5 mg QQ 6 hrs prn for wheezing/SOB. Cipro 500 mg po bid x 7 days for uti. Estradiol 2 mg po daily for estrogen replacement. Furosemide 20 mg po daily for hx of CHF.Nicotine patch 21 mg topically Q 24 hrs for nicotine withdrawal. NTG 0.4 mg sublingually Q 5 minutes prn for chest pain. Protonix 40 mg po Q am for GERD. Miralax 17 g twice daily prn for constipation. Kdur 20 meq po bid for potassium replacement. Started on Lisinopril 5 mg po daily HTN.  Lisinopril 10 mg po x 1 (loading dose) for HTN today.   Continue to encourage group attendance/participation. Discharge disposition in progress.  Nicholes Rough, NP, PMHNP-BC 08/02/2021, 1:48 PM  Total Time Spent in  Direct Patient Care:  I personally spent 30 minutes on the unit in direct patient care. The direct patient care time included face-to-face time with the patient, reviewing the patient's chart, communicating with other professionals, and coordinating care. Greater than 50% of this time was spent in counseling or coordinating care with the patient regarding goals of hospitalization, psycho-education, and discharge planning needs.  I have independently evaluated the patient during a face-to-face assessment on 08/03/21. I reviewed the patient's chart, and I participated in key portions of the service. I discussed the case with the APP, and I agree with the assessment and plan of care as documented in the APP's note , as addended by me or notated below:  I directly edited the note, as above. Agree with plan.    Janine Limbo, MD Psychiatrist

## 2021-08-02 NOTE — Progress Notes (Signed)
CSW met with patient and discussed ongoing discharge planning.  Patient reports wanting to go home but demonstrated understanding that her discharge is dependent on doctors daily assessment.  She is aware that discharge may not be today.  Patient discussed needing to pursue rental assistance from organizations or DSS [place].  [place] printed off programs she may qualify for for rental assistance and provided that information to patient, including the Continuum of Care Program with [place] [employer], advertising Program.     Sylva, LCSW, [place] Worker  [facility]

## 2021-08-02 NOTE — BHH Group Notes (Signed)
[place] Group Notes:  (Nursing/MHT/Case Management/Adjunct)  Date:  08/02/2021  Time:  8:57 PM  Type of Therapy:  Psychoeducational Skills  Participation Level:  Active  Participation Quality:  Appropriate  Affect:  Appropriate  Cognitive:  Appropriate  Insight:  Good  Engagement in Group:  Engaged  Modes of Intervention:  Education  Summary of Progress/Problems: The patient rated her day as a 10 out of 10 since she found out that she will be discharged tomorrow. She also had a good day since she met some new people as well.   Archie Balboa S 08/02/2021, 8:57 PM

## 2021-08-03 ENCOUNTER — Ambulatory Visit: Payer: Medicare Other | Admitting: Orthopaedic Surgery

## 2021-08-03 ENCOUNTER — Inpatient Hospital Stay: Admission: RE | Admit: 2021-08-03 | Payer: Medicaid Other | Source: Ambulatory Visit

## 2021-08-03 LAB — BASIC METABOLIC PANEL
Anion gap: 6 (ref 5–15)
BUN: 17 mg/dL (ref 6–20)
CO2: 25 mmol/L (ref 22–32)
Calcium: 8.6 mg/dL — ABNORMAL LOW (ref 8.9–10.3)
Chloride: 105 mmol/L (ref 98–111)
Creatinine, Ser: 0.88 mg/dL (ref 0.44–1.00)
GFR, Estimated: >60 mL/min (ref >60)
Glucose, Bld: 132 mg/dL — ABNORMAL HIGH (ref 70–99)
Potassium: 3.8 mmol/L (ref 3.5–5.1)
Sodium: 136 mmol/L (ref 135–145)

## 2021-08-03 MED ORDER — ARIPIPRAZOLE 20 MG PO TABS
20.0000 mg | ORAL_TABLET | Freq: Every day | ORAL | 0 refills | Status: AC
Start: 2021-08-04 — End: ?

## 2021-08-03 MED ORDER — MELATONIN 5 MG PO TABS: 5.0000 mg | ORAL_TABLET | Freq: Every day | ORAL | 0 refills | Status: DC

## 2021-08-03 MED ORDER — ALBUTEROL SULFATE HFA 108 (90 BASE) MCG/ACT IN AERS: 2.0000 | INHALATION_SPRAY | Freq: Four times a day (QID) | RESPIRATORY_TRACT | 0 refills | Status: DC | PRN

## 2021-08-03 MED ORDER — PANTOPRAZOLE SODIUM 40 MG PO TBEC
40.0000 mg | DELAYED_RELEASE_TABLET | Freq: Every day | ORAL | 0 refills | Status: DC
Start: 2021-08-03 — End: 2023-11-23

## 2021-08-03 MED ORDER — NICOTINE 21 MG/24HR TD PT24: 21.0000 mg | MEDICATED_PATCH | Freq: Every day | TRANSDERMAL | 0 refills | Status: AC

## 2021-08-03 MED ORDER — POTASSIUM CHLORIDE CRYS ER 20 MEQ PO TBCR: 20.0000 meq | EXTENDED_RELEASE_TABLET | Freq: Two times a day (BID) | ORAL | 0 refills | Status: DC

## 2021-08-03 MED ORDER — HYDROXYZINE HCL 25 MG PO TABS: 25.0000 mg | ORAL_TABLET | Freq: Three times a day (TID) | ORAL | 0 refills | Status: DC | PRN

## 2021-08-03 MED ORDER — LISINOPRIL 5 MG PO TABS: 5.0000 mg | ORAL_TABLET | Freq: Every day | ORAL | 0 refills | Status: DC

## 2021-08-03 NOTE — Progress Notes (Signed)
    On assessment, pt presents as mildly anxious.  Administered PRN Vistaril for anxiety, per [facility], per pt request.  Pt anticipates going home and then going to [facility].  Pt reports, "this visit really helped her."  Pt denies SI/HI and verbally contracts for safety.  Pt denies AVH. Pt is safe on unit with Q 15 minute safety checks.   08/02/21 2119  Psych Admission Type (Psych Patients Only)  Admission Status Voluntary  Psychosocial Assessment  Patient Complaints None  Eye Contact Fair  Facial Expression Animated;Anxious  Affect Anxious;Appropriate to circumstance  Speech Logical/coherent  Interaction Assertive  Motor Activity Slow  Appearance/Hygiene Unremarkable  Behavior Characteristics Cooperative  Mood Pleasant;Anxious  Thought Process  Coherency WDL  Content WDL  Delusions None reported or observed  Perception WDL  Hallucination None reported or observed  Judgment WDL  Confusion None  Danger to Self  Current suicidal ideation? Denies  Self-Injurious Behavior No self-injurious ideation or behavior indicators observed or expressed   Agreement Not to Harm Self Yes  Description of Agreement verbal  Danger to Others  Danger to Others None reported or observed

## 2021-08-03 NOTE — BHH Group Notes (Signed)
[place] Group Notes:  (Nursing/MHT/Case Management/Adjunct)  Date:  08/03/2021  Time:  8:21 PM  Type of Therapy:   NA Group  Participation Level:  Active  Participation Quality:  Appropriate and Attentive  Affect:  Appropriate  Cognitive:  Appropriate  Insight:  Appropriate, Good, and Improving  Engagement in Group:  Developing/Improving and Improving  Modes of Intervention:  Discussion  Summary of Progress/Problems: PT was in a good mood. PT was appropriate and active in the day room.  Lynn Logan 08/03/2021, 8:21 PM

## 2021-08-03 NOTE — BHH Group Notes (Signed)
Adult Psychoeducational Group Note  Date:  08/03/2021 Time:  8:47 AM  Group Topic/Focus:  Goals Group:   The focus of this group is to help patients establish daily goals to achieve during treatment and discuss how the patient can incorporate goal setting into their daily lives to aide in recovery.  Participation Level:  Active  Participation Quality:  Appropriate  Affect:  Appropriate  Cognitive:  Appropriate  Insight: Appropriate  Engagement in Group:  Engaged  Modes of Intervention:  Discussion  Additional Comments:  Pt goal; To go home, spend time with family for Thanksgiving.  Dub Mikes 08/03/2021, 8:47 AM

## 2021-08-03 NOTE — Progress Notes (Signed)
CSW called patient son, Lynn Logan, and confirmed via voicemail that patient will be discharging tomorrow.  Support encouraged to call nurses station back if he has a specific time in the morning he will be able to pick patient up.    [name], LCSW, [place] Social Worker  [facility]

## 2021-08-03 NOTE — Group Note (Signed)
LCSW Group Therapy Note   Group Date: 08/03/2021 Start Time: 1300 End Time: 1400  Type of Therapy and Topic:  Group Therapy: Worry    Participation Level: Active   Description of Group:   In this group, patients learned how to define worry. Patients were asked to identify a time they felt anxiety or something that triggers their anxiety. Patients engaged in a matching interactive activity matching bugs, with each match facts about worry was shared. Patients and CSW engaged in discussion surrounding each fact / definition shared. Patients were then asked to explore positive coping mechanisms for worry and discussed things like unrealistic worry, things out of our control and times that they worried about something but actually ended up enjoying it (I.e. Learning to ride a bike). Patients discussed several new ways to handle worry such as music, journaling, thinking about positive endings instead of negative, drawing, happy place, relaxation skills (deep breathing, progressive muscle relaxation and meditation) and engaging in a hobby. CSW asked patients to commit to trying a positive coping skill when faced with worry in the future.   Therapeutic Goals: Patients will recall a time they felt worried or identify something they worry about often. Patients will learn how to define worry.  Patients will learn that some worry cannot be erased, as some things are out of out control and at times our worries are not likely to happen.  Patients will be asked to do some self-reflection with their own worry (throughout the matching activity). Patients will be asked to practice impulse control with the matching game and imagery with the happy place during this group.   Patients were asked to share ways they can cope with anxiety.       Summary of Patient Progress:  Pt came to group and introduced herself and shared that she is worries because her fiancee passed infront of her. Pt completed her worksheet but  left before group discussion.     Therapeutic Modalities:   Cognitive Behavioral Therapy Motivational Interviewing  Brief Therapy   Mliss Fritz, [place] 08/03/2021  1:20 PM

## 2021-08-03 NOTE — Plan of Care (Signed)
Nurse discussed anxiety, depression and coping skills with patient.  

## 2021-08-03 NOTE — Progress Notes (Signed)
CSW spoke with patient son, Broadus John, who reported that patient has been sounding good in his interactions with him.  Son reports that he can pick patient up in the morning tomorrow if providers deem her good for discharge.  CSW let support know that they would confirm if patient would be discharged tomorrow later today.    Gayleen Sholtz, LCSW, [place] Social Worker  [facility]

## 2021-08-03 NOTE — Plan of Care (Signed)
  Problem: Education: Goal: Emotional status will improve Outcome: Progressing Goal: Mental status will improve Outcome: Progressing   Problem: Education: Goal: Knowledge of the prescribed therapeutic regimen will improve Outcome: Progressing

## 2021-08-03 NOTE — [facility] Group Notes (Signed)
Adult Psychoeducational Group Note  Date:  08/03/2021 Time:  4:47 PM  Group Topic/Focus:  Relaxation  Participation Level:  Active  Participation Quality:  Appropriate  Affect:  Appropriate  Cognitive:  Appropriate  Insight: Appropriate  Engagement in Group:  Engaged  Modes of Intervention:  Activity    Dub Mikes 08/03/2021, 4:47 PM

## 2021-08-03 NOTE — Progress Notes (Addendum)
[facility] MD Progress Note  08/03/2021 9:21 AM Lynn Logan  MRN:  [id number]  Subjective: Patient seen during this encounter in the office on the 300 hall. Pt states: "I am doing excellent. I slept well last night and have a place to go when I leave here."   Evaluation on the unit today (08/03/21):  Patient is seen, chart reviewed and case discussed with the treatment team. She is alert & oriented. Attention to personal hygiene and grooming is fair. Pt is calm and cooperative, and maintains good eye contact through encounter. Pt's speech is clear and coherent, but thoughts are tangential, with flight of ideas, and she sounds illogical at times, as she rambled from topic to topic.   Pt is excited she will be discharged tomorrow. She stated she going to spend time with her children on Thanksgiving. She stated she is going to go back to [facility], get connected with groups and go to grief counseling.She stated she is still trying to process everything going on in her life. Patient stated she is taking her medications without side effects. She reported a good appetite. Pt mentioned that she and her children have to be out of where she currently lives by 08/11/21, then stated that her son will be coming from [place] every day before lunch to give her medications because she does not want her daughter handling her medications. Patient's social worker, Lenna Sciara, spoke with her son today and he stated his mother sounds like she is doing well and at baseline. He stated he can come to pick her up in the morning.    Pt reported "excellent" sleep last night, record shows she slept 6.75 hours, improved over previous night at 6 hours.  Pt denied suicidal and homicidal ideation, and denied auditory or visual hallucinations, Patient denies paranoia and delusions and does not appear to be responding to internal stimuli. Patient stated she was paranoid and hearing voices prior to hospitalization, but is not feeling paranoid  or having hallucinations anymore.  Pt has been visible on the unit interacting with other patients, and attending groups and unit activities. Patient has no complaints of physical pain or urinary frequency, urgency or dysuria. Patient was treated for UTI, Cipro 500 mg BID was completed on 11/22.   Objective: Patient is a 50 year old Caucasian female with prior hx of mental illness/psychiatric admissions. She has in the past been admitted to the [facility] psychiatric ward in [place] just of recent, at the [facility] She was receiving mental health care on an outpatient basis at the [facility] in [place] with Dr. Sheralyn Boatman. She is currently admitted to the [facility] from the [facility] with complain of worsening suicidal ideation with plan & an attempt to cut her wrists. Patient also admitted to the [facility] staff that she was also seeing her dead fiance in her kitchen.  Principal Problem: Bipolar I disorder, current or most recent episode depressed, with psychotic features ([place])  Diagnosis: Principal Problem:   Bipolar I disorder, current or most recent episode depressed, with psychotic features ([place]) Active Problems:   Diastolic CHF ([place])   COPD (chronic obstructive pulmonary disease) ([place])   Tobacco user   PTSD (post-traumatic stress disorder)    Past Psychiatric History: Major depressive disorder, PTSD  Past Medical History:  Past Medical History:  Diagnosis Date   Anxiety    Arthritis    Rheumatoid   Asthma    Breast cancer ([place])    CHF (  congestive heart failure) (HCC)    Chronic back pain    Colon cancer (HCC)    COPD (chronic obstructive pulmonary disease) (HCC)    Depression    Fibromyalgia    Gout    Heart attack ([place]) 2017   Renal disorder    Right lumbar radiculopathy    Vitiligo    face    Past Surgical History:  Procedure Laterality Date   CESAREAN SECTION     DILITATION & CURRETTAGE/HYSTROSCOPY WITH NOVASURE ABLATION N/A 07/07/2015   Procedure:  HYSTEROSCOPY, UTERINE CURETTAGE, ENDOMETRIAL  ABLATION Uterine Cavity Length=6.5cm Uterine Cavity Width=4.5cm Power=161 Watts Time=1 minute 19 seconds;  Surgeon: Florian Buff, MD;  Location: [facility];  Service: Gynecology;  Laterality: N/A;   POLYPECTOMY  07/07/2015   Procedure: POLYPECTOMY;  Surgeon: Florian Buff, MD;  Location: [facility];  Service: Gynecology;;   SALPINGOOPHORECTOMY Bilateral 10/13/2015   Procedure: BILATERAL SALPINGO OOPHORECTOMY;  Surgeon: Florian Buff, MD;  Location: [facility];  Service: Gynecology;  Laterality: Bilateral;   TUBAL LIGATION     VAGINAL HYSTERECTOMY N/A 10/13/2015   Procedure: HYSTERECTOMY VAGINAL;  Surgeon: Florian Buff, MD;  Location: [facility];  Service: Gynecology;  Laterality: N/A;   Family History:  Family History  Problem Relation Age of Onset   Diabetes Mother    Congestive Heart Failure Mother    Depression Father    Hypertension Father    Cancer Father    Heart disease Maternal Uncle    Depression Maternal Grandmother    Family Psychiatric  History: See H&P.  Social History:  Social History   Substance and Sexual Activity  Alcohol Use No   Alcohol/week: 0.0 standard drinks     Social History   Substance and Sexual Activity  Drug Use No   Comment: Hx of marijuana use - None now    Social History   Socioeconomic History   Marital status: Divorced    Spouse name: Not on file   Number of children: 2   Years of education: 11   Highest education level: 11th grade  Occupational History   Not on file  Tobacco Use   Smoking status: Never    Passive exposure: Never   Smokeless tobacco: Never   Tobacco comments:    Patient denied use of tobacco  Substance and Sexual Activity   Alcohol use: No    Alcohol/week: 0.0 standard drinks   Drug use: No    Comment: Hx of marijuana use - None now   Sexual activity: Not Currently    Birth control/protection: Surgical  Other Topics Concern   Not on file  Social History Narrative   Lives with  daughter in [place], [place].  Denied use of tobacco, alcohol, all drug use.   Social Determinants of Health   Financial Resource Strain: Not on file  Food Insecurity: Not on file  Transportation Needs: Not on file  Physical Activity: Not on file  Stress: Not on file  Social Connections: Not on file   Additional Social History:   Sleep: Good  Appetite:  Good  Current Medications: Current Facility-Administered Medications  Medication Dose Route Frequency Provider Last Rate Last Admin   acetaminophen (TYLENOL) tablet 650 mg  650 mg Oral Q6H PRN Chalmers Guest, NP   650 mg at 07/31/21 1701   albuterol (PROVENTIL) (2.5 MG/3ML) 0.083% nebulizer solution 2.5 mg  2.5 mg Nebulization Q6H PRN Lindell Spar I, NP       albuterol (VENTOLIN HFA) 108 (  90 Base) MCG/ACT inhaler 2 puff  2 puff Inhalation Q6H PRN Lindell Spar I, NP       alum & mag hydroxide-simeth (MAALOX/MYLANTA) 200-200-20 MG/5ML suspension 30 mL  30 mL Oral Q4H PRN Chalmers Guest, NP       ARIPiprazole (ABILIFY) tablet 20 mg  20 mg Oral Daily Ethelene Hal, NP   20 mg at 08/03/21 0800   docusate sodium (COLACE) capsule 100 mg  100 mg Oral Daily Nicholes Rough, NP   100 mg at 08/03/21 0801   estradiol (ESTRACE) tablet 2 mg  2 mg Oral Daily Chalmers Guest, NP   2 mg at 08/03/21 0801   furosemide (LASIX) tablet 20 mg  20 mg Oral Daily Chalmers Guest, NP   20 mg at 08/03/21 0801   hydrOXYzine (ATARAX/VISTARIL) tablet 25 mg  25 mg Oral TID PRN Chalmers Guest, NP   25 mg at 08/03/21 0804   lisinopril (ZESTRIL) tablet 5 mg  5 mg Oral Daily Lindell Spar I, NP   5 mg at 08/03/21 0801   magnesium hydroxide (MILK OF MAGNESIA) suspension 30 mL  30 mL Oral Daily PRN Chalmers Guest, NP       melatonin tablet 5 mg  5 mg Oral QHS Velicia Dejager, Ovid Curd, MD   5 mg at 08/02/21 2119   nicotine (NICODERM CQ - dosed in mg/24 hours) patch 21 mg  21 mg Transdermal Q0600 Chalmers Guest, NP   21 mg at 08/02/21 0557   nitroGLYCERIN (NITROSTAT) SL tablet  0.4 mg  0.4 mg Sublingual Q5 min PRN Chalmers Guest, NP       pantoprazole (PROTONIX) EC tablet 40 mg  40 mg Oral Daily Chalmers Guest, NP   40 mg at 08/03/21 0802   polyethylene glycol (MIRALAX / GLYCOLAX) packet 17 g  17 g Oral BID PRN Chalmers Guest, NP       potassium chloride SA (KLOR-CON) CR tablet 20 mEq  20 mEq Oral BID Chalmers Guest, NP   20 mEq at 08/03/21 5462   Lab Results:  No results found for this or any previous visit (from the past 48 hour(s)).  Blood Alcohol level:  Lab Results  Component Value Date   ETH <10 70/35/0093   Metabolic Disorder Labs: Lab Results  Component Value Date   HGBA1C 5.4 07/27/2021   MPG 108.28 07/27/2021   No results found for: PROLACTIN Lab Results  Component Value Date   CHOL 177 07/27/2021   TRIG 172 (H) 07/27/2021   HDL 45 07/27/2021   CHOLHDL 3.9 07/27/2021   VLDL 34 07/27/2021   LDLCALC 98 07/27/2021   LDLCALC 124 (H) 12/31/2015   Physical Findings: AIMS: Facial and Oral Movements Muscles of Facial Expression: None, normal Lips and Perioral Area: None, normal Jaw: None, normal Tongue: None, normal,Extremity Movements Upper (arms, wrists, hands, fingers): None, normal Lower (legs, knees, ankles, toes): None, normal, Trunk Movements Neck, shoulders, hips: None, normal, Overall Severity Severity of abnormal movements (highest score from questions above): None, normal Incapacitation due to abnormal movements: None, normal Patient's awareness of abnormal movements (rate only patient's report): No Awareness, Dental Status Current problems with teeth and/or dentures?: No Does patient usually wear dentures?: No   CIWA:    COWS:     Musculoskeletal: Strength & Muscle Tone: within normal limits Gait & Station: normal Patient leans: N/A  Psychiatric Specialty Exam:  Presentation  General Appearance: Appropriate for Environment; Fairly Groomed  Eye  Contact:Fair  Speech:Normal Rate  Speech  Volume:Normal  Handedness:Right  Mood and Affect  Mood: Euthymic  Affect: Full range, congruent with mood  Thought Process  Thought Processes: more linear  Descriptions of Associations: Less Tangential  Orientation:Full (Time, Place and Person)  Thought Content: Intact, coherent, logical  History of Schizophrenia/Schizoaffective disorder:No  Duration of Psychotic Symptoms:Greater than six months  Hallucinations:Hallucinations: None  Ideas of Reference:None  Suicidal Thoughts:Suicidal Thoughts: No , patient denies   Homicidal Thoughts:Homicidal Thoughts: No , patient denies  Sensorium  Memory:Immediate Good  Judgment:Fair  Insight: [place]  Community education officer  Concentration:Good  Attention Span:Good  [place]  Language:Good  Psychomotor Activity  Psychomotor Activity:Psychomotor Activity: Normal  Assets  Assets:Financial Resources/Insurance  Sleep  Sleep: Fair, 6.5 hours  Physical Exam: Physical Exam Vitals and nursing note reviewed.  HENT:     Head: Normocephalic and atraumatic.  Pulmonary:     Effort: Pulmonary effort is normal.     Comments: Hx. COPD Genitourinary:    Comments: Deferred Musculoskeletal:        General: Normal range of motion.     Cervical back: Normal range of motion.  Skin:    General: Skin is warm.  Neurological:     General: No focal deficit present.     Mental Status: She is alert and oriented to person, place, and time.  Psychiatric:        Attention and Perception: Attention normal. She does not perceive auditory or visual hallucinations.        Mood and Affect: Mood normal.        Speech: Speech normal.        Behavior: Behavior normal. Behavior is cooperative.        Thought Content: Thought content normal. Thought content is not paranoid or delusional. Thought content does not include homicidal or suicidal ideation. Thought content does not include homicidal or suicidal plan.         Cognition and Memory: Cognition normal.   Review of Systems  Constitutional:  Negative for chills, diaphoresis, fever and malaise/fatigue.  HENT:  Negative for congestion and sore throat.   Eyes:  Negative for blurred vision.  Respiratory:  Negative for cough, shortness of breath and wheezing.   Cardiovascular:  Negative for chest pain and palpitations.  Gastrointestinal:  Negative for abdominal pain, constipation, diarrhea, heartburn, nausea and vomiting.  Genitourinary:  Negative for dysuria and urgency.  Musculoskeletal:  Negative for joint pain and myalgias.  Skin:  Negative for itching and rash.  Neurological:  Negative for dizziness, tingling, tremors, sensory change, speech change, focal weakness, seizures, loss of consciousness, weakness and headaches.  Endo/Heme/Allergies:        Toradol. Buspirone  Lyrica Gabapentin Tramadol Zofran  Penicillins       Psychiatric/Behavioral:  Negative for depression (Improving with medications), hallucinations (Hx. visual hallucinations.), memory loss, substance abuse and suicidal ideas. The patient is nervous/anxious. The patient does not have insomnia.    Blood pressure (!) 132/99, pulse 96, temperature (!) 97.5 F (36.4 C), temperature source Oral, resp. rate 18, height 5\' 2"  (1.575 m), weight 91.6 kg, last menstrual period 09/12/2015, SpO2 96 %. Body mass index is 36.95 kg/m.  Treatment Plan Summary: Daily contact with patient to assess and evaluate symptoms and progress in treatment and Medication management.   Continue inpatient hospitalization. Will continue today 08/03/2021 plan as below except where it is noted.   Diagnoses: Schizoaffective disorder, bipolar-type/r/o Schizophrenia/bipolar.  PTSD.    Other  medical conditions.  COPD CHF   2. Medication management to reduce current symptoms to base line and improve the patient's overall level of functioning: See MAR for complete med list.   Major depressive disorder with  psychosis, R/o bipolar disorder/Schizophrenia.  -Continue Abilify 20 mg po daily.     Anxiety.  -Continue Hydroxyzine 25 mg po tid prn.   Insomnia.  -Continue Melatonin 5 mg po Q hs.  Constipation -Continue Colace 100mg  Daily for constipation    3. Other medical conditions/ailments, continue:  Albuterol inhaler 2 puff Q 6 hrs prn for SOB. Albuterol nebulizing treatment 2.5 mg QQ 6 hrs prn for wheezing/SOB. Cipro 500 mg po bid x 7 days for uti. Completed 08/02/2021 @ 2119 Estradiol 2 mg po daily for estrogen replacement. Furosemide 20 mg po daily for hx of CHF.Nicotine patch 21 mg topically Q 24 hrs for nicotine withdrawal. NTG 0.4 mg sublingually Q 5 minutes prn for chest pain. Protonix 40 mg po Q am for GERD. Miralax 17 g twice daily prn for constipation. Kdur 20 meq po bid for potassium replacement. Started on Lisinopril 5 mg po daily HTN.  Lisinopril 10 mg po x 1 (loading dose) for HTN today.   Continue to encourage group attendance/participation. Discharge disposition in progress. Patient will discharge on 08/04/2021. Family will pick patient up.   Ethelene Hal, NP 08/03/2021, 10:58 AM  Total Time Spent in Direct Patient Care:  I personally spent 20 minutes on the unit in direct patient care. The direct patient care time included face-to-face time with the patient, reviewing the patient's chart, communicating with other professionals, and coordinating care. Greater than 50% of this time was spent in counseling or coordinating care with the patient regarding goals of hospitalization, psycho-education, and discharge planning needs.  I have independently evaluated the patient during a face-to-face assessment on 08/03/21. I reviewed the patient's chart, and I participated in key portions of the service. I discussed the case with the APP, and I agree with the assessment and plan of care as documented in the APP's note , as addended by me or notated below:  Agree with note  and plan.  Janine Limbo, MD Psychiatrist

## 2021-08-03 NOTE — Progress Notes (Signed)
CSW resent court letter due to change in discharge date.  CSW notified criminal court of [place] of expected discharge of 08/04/2021.    Asmara Backs, LCSW, [place] Social Worker  [facility]

## 2021-08-03 NOTE — Progress Notes (Signed)
D:  Patient's self inventory sheet, patient sleeps good, no sleep medication.  Good appetite, normal energy level, good concentration.  Rated depression, anxiety and hopeless #10.  Denied withdrawals.  Denied physical pain.  Goal is discharge.  Plans to talk to MD.  Thank you.  Does have discharge plans. A:  Medications administered per MD orders.  Emotional support and encouragement given patient. R:  Denied SI and HI, contracts for safety.  Denied A/V hallucinations.  Safety maintained with 15 minute checks.

## 2021-08-04 NOTE — Discharge Summary (Signed)
Physician Discharge Summary Note  Patient:  Lynn Logan is an 50 y.o., female MRN:  [id number] DOB:  Apr 09, 1971 Patient phone:  [phone] (home)  Patient address:   [address] [place] [address],  Total Time spent with patient:  Greater than 30 minutes  Date of Admission:  07/26/2021  Date of Discharge: 08-04-21  Reason for Admission: Worsening suicidal ideation with plan & an attempt to cut her wrists.  Principal Problem: Bipolar I disorder, current or most recent episode depressed, with psychotic features [facility])  Discharge Diagnoses: Principal Problem:   Bipolar I disorder, current or most recent episode depressed, with psychotic features ([place]) Active Problems:   Diastolic CHF ([place])   COPD (chronic obstructive pulmonary disease) ([place])   Tobacco user   PTSD (post-traumatic stress disorder)  Past Psychiatric History: Bipolar 1 disorder  Past Medical History:  Past Medical History:  Diagnosis Date   Anxiety    Arthritis    Rheumatoid   Asthma    Breast cancer ([place])    CHF (congestive heart failure) ([facility])    Chronic back pain    Colon cancer ([place])    COPD (chronic obstructive pulmonary disease) ([facility])    Depression    Fibromyalgia    Gout    Heart attack ([place]) 2017   Renal disorder    Right lumbar radiculopathy    Vitiligo    face    Past Surgical History:  Procedure Laterality Date   CESAREAN SECTION     DILITATION & CURRETTAGE/HYSTROSCOPY WITH NOVASURE ABLATION N/A 07/07/2015   Procedure: HYSTEROSCOPY, UTERINE CURETTAGE, ENDOMETRIAL  ABLATION Uterine Cavity Length=6.5cm Uterine Cavity Width=4.5cm Power=161 Watts Time=1 minute 19 seconds;  Surgeon: Florian Buff, MD;  Location: [facility];  Service: Gynecology;  Laterality: N/A;   POLYPECTOMY  07/07/2015   Procedure: POLYPECTOMY;  Surgeon: Florian Buff, MD;  Location: [facility];  Service: Gynecology;;   SALPINGOOPHORECTOMY Bilateral 10/13/2015   Procedure: BILATERAL SALPINGO OOPHORECTOMY;  Surgeon: Florian Buff, MD;  Location: [facility];  Service: Gynecology;  Laterality: Bilateral;   TUBAL LIGATION     VAGINAL HYSTERECTOMY N/A 10/13/2015   Procedure: HYSTERECTOMY VAGINAL;  Surgeon: Florian Buff, MD;  Location: [facility];  Service: Gynecology;  Laterality: N/A;   Family History:  Family History  Problem Relation Age of Onset   Diabetes Mother    Congestive Heart Failure Mother    Depression Father    Hypertension Father    Cancer Father    Heart disease Maternal Uncle    Depression Maternal Grandmother    Family Psychiatric  History: See H&P Social History:  Social History   Substance and Sexual Activity  Alcohol Use No   Alcohol/week: 0.0 standard drinks     Social History   Substance and Sexual Activity  Drug Use No   Comment: Hx of marijuana use - None now    Social History   Socioeconomic History   Marital status: Divorced    Spouse name: Not on file   Number of children: 2   Years of education: 11   Highest education level: 11th grade  Occupational History   Not on file  Tobacco Use   Smoking status: Never    Passive exposure: Never   Smokeless tobacco: Never   Tobacco comments:    Patient denied use of tobacco  Substance and Sexual Activity   Alcohol use: No    Alcohol/week: 0.0 standard drinks   Drug use: No    Comment:  Hx of marijuana use - None now   Sexual activity: Not Currently    Birth control/protection: Surgical  Other Topics Concern   Not on file  Social History Narrative   Lives with daughter in [place], [place].  Denied use of tobacco, alcohol, all drug use.   Social Determinants of Health   Financial Resource Strain: Not on file  Food Insecurity: Not on file  Transportation Needs: Not on file  Physical Activity: Not on file  Stress: Not on file  Social Connections: Not on file   Hospital Course: (Per admission evaluation notes): This is an admission evaluation for this 50 year old Caucasian female with prior hx of mental illness/psychiatric  admissions. She has in the past been admitted to the [facility] psychiatric ward in [place] just of recent, at the [facility]. She was receiving mental health care on an outpatient basis at the [facility] in [facility] with Dr. Sheralyn Boatman. She is currently admitted to the [facility] from the [facility] with complain of worsening suicidal ideation with plan & an attempt to cut her wrists. Patient also admitted to the [facility] staff that she was also seeing her dead fiance in her kitchen. After medical evaluation/clearance, she was brought to the [facility] for further psychiatric evaluation/treatments. During this evaluation, Mataya reports, "Two days ago, the detective took me to the [facility]. I was at an internal medicine clinic seeing my primary care provider, Dr. Brigitte Pulse. And while he was asking me questions, I did tell him that 3 days prior, I wanted to kill myself by trying to cut my wrist, but my daughter took the knife away from me. What happened & led to this was, I have not been on mental health medicines in a week. My daughter has not been given me any of my mental health medicines or my medicines for my other medical stuff because she was mad at me. I got mad & called a bitch. I live with my daughter & she is suppose to handle all my medicines because I have the tendency to try to overdose on them. That was the reason I was admitted to the [facility] but they did not help me there at all. My daughter would not even allow me to walk across the street to the convenient store to buy a pack of cigarettes. She was afraid that I would black-out & fall on the road & get run over by cars. My fiancee died almost 2 months ago (06-28-2021) from fentanyl/methamphetamine overdose. He may have also take some Xanax pills & used some cocaine as well. I found him dead on the floor of our bedroom with blood coming out of his mouth/nose. It traumatized me pretty bad. I have been feeling very depressed  since this happened & my medical conditions has worsened as well. Also, prior to my admission at the [facility], I was feeling like I have gone out of my mind because someone may have drugged me by putting stuff in my tea. Since the passing of my fiancee, I have been seeing him twice a week in the kitchen cooking. Over the years, I have attempted suicide x three by overdose, cutting my wrist & putting a pistol in my mouth to blow my brains out. I don't sleep well at night & I get bad mood swings about four times a week. I have suffered from a lot of medical problems, breast cancer, colon cancer, CHF &  COPD".   Ashlyn was admitted to the [facility] adult unit for worsening symptoms of Bipolar I disorder, most recent episode, depressed with psychotic features. Although with hx of previous mental instability, this is her first inpatient psychiatric admission. After the above admission evaluation & identification of her presenting symptoms, She was recommended for mood stabilization treatment. And with her consent, she was medicated, stabilized & discharged on the medications as listed below on the discharge medication lists. Her other pre-existing medical problems were identified and treated by resuming her pertinent home medications for those health issues. She tolerated her treatment regimen without any adverse effects or reactions reported. She was also enrolled & participated in the group counseling sessions being offered & held on this unit. She learned coping skills.  During the course of her hospitalization, Ashaki's progress was monitored by the observation of her daily report of symptom reduction. Her emotional & mental status were  monitored by the daily self-inventory reports completed by her & the clinical staff. The 15-minute checks were adequate to ensure her safety.  Patient did not display any dangerous, violent or suicidal behavior on the unit.  She interacted with patients & staff  appropriately, participated appropriately in the group sessions/therapies. Her medications were addressed & adjusted to meet her needs. She was recommended for outpatient follow-up care & medication management upon discharge to assure continuity of care.    Today upon her discharge evaluation with the attending psychiatrist, Nickola shares she is doing well. She denies any other specific concerns. She is sleeping well. Her appetite is good. She denies other physical complaints. She denies AH/VH. She feels that her medications have been helpful & is in agreement to continue her current treatment regimen. She was able to engage in safety planning including plan to return to [facility] or contact emergency services if she feels unable to maintain her own safety or the safety of others. Pt had no further questions, comments, or concerns. She left [facility] with all personal belongings in no apparent distress. Transportation per her family (son).   Physical Findings: AIMS: Facial and Oral Movements Muscles of Facial Expression: None, normal Lips and Perioral Area: None, normal Jaw: None, normal Tongue: None, normal,Extremity Movements Upper (arms, wrists, hands, fingers): None, normal Lower (legs, knees, ankles, toes): None, normal, Trunk Movements Neck, shoulders, hips: None, normal, Overall Severity Severity of abnormal movements (highest score from questions above): None, normal Incapacitation due to abnormal movements: None, normal Patient's awareness of abnormal movements (rate only patient's report): No Awareness, Dental Status Current problems with teeth and/or dentures?: No Does patient usually wear dentures?: No  CIWA:    COWS:     Musculoskeletal: Strength & Muscle Tone: within normal limits Gait & Station: normal Patient leans: N/A   Psychiatric Specialty Exam:  Presentation  General Appearance: Appropriate for Environment; Casual; Fairly Groomed  Eye Contact:Good  Speech:Clear and  Coherent; Normal Rate  Speech Volume:Normal  Handedness:Right  Mood and Affect  Mood:Euthymic  Affect:Congruent; Full Range; Appropriate  Thought Process  Thought Processes:Coherent; Linear  Descriptions of Associations:Intact  Orientation:Full (Time, Place and Person)  Thought Content:Logical  History of Schizophrenia/Schizoaffective disorder:No  Duration of Psychotic Symptoms:Greater than six months  Hallucinations:Hallucinations: None  Ideas of Reference:None  Suicidal Thoughts:Suicidal Thoughts: No  Homicidal Thoughts:Homicidal Thoughts: No   Sensorium  Memory:Immediate Good; Recent Good; Remote Good  Judgment:Good  Insight:Good  Executive Functions  Concentration:Good  Attention Span:Good  [place]  Language:Good  Psychomotor Activity  Psychomotor Activity:Psychomotor Activity:  Normal  Assets  Assets:Financial Resources/Insurance  Sleep  Sleep:Sleep: Good  Physical Exam: Physical Exam Vitals and nursing note reviewed.  HENT:     Head: Normocephalic.     Nose: Nose normal.     Mouth/Throat:     Pharynx: Oropharynx is clear.  Eyes:     Pupils: Pupils are equal, round, and reactive to light.  Cardiovascular:     Rate and Rhythm: Normal rate.     Pulses: Normal pulses.  Pulmonary:     Effort: Pulmonary effort is normal.  Genitourinary:    Comments: Deferred Musculoskeletal:        General: Normal range of motion.     Cervical back: Normal range of motion.  Skin:    General: Skin is warm and dry.  Neurological:     General: No focal deficit present.     Mental Status: She is alert and oriented to person, place, and time.   Review of Systems  Constitutional:  Negative for chills and fever.  HENT:  Negative for congestion and sore throat.   Eyes:  Negative for blurred vision.  Respiratory:  Negative for cough, shortness of breath and wheezing.   Cardiovascular:  Negative for chest pain and  palpitations.  Gastrointestinal:  Negative for abdominal pain, constipation, diarrhea, heartburn, nausea and vomiting.  Genitourinary:  Negative for dysuria.  Musculoskeletal:  Negative for joint pain and myalgias.  Skin: Negative.   Neurological:  Negative for dizziness, tingling, tremors, sensory change, speech change, focal weakness, seizures, loss of consciousness, weakness and headaches.  Endo/Heme/Allergies:        Allergies: Toradol Buspar Lyrica Gabapentin  Tramadol  Zofran Penicillins      Psychiatric/Behavioral:  Negative for depression (Stable on medication.), hallucinations, memory loss, substance abuse and suicidal ideas. The patient is not nervous/anxious and does not have insomnia.   Blood pressure (!) 123/94, pulse 99, temperature 97.9 F (36.6 C), temperature source Oral, resp. rate 18, height 5\' 2"  (1.575 m), weight 91.6 kg, last menstrual period 09/12/2015, SpO2 98 %. Body mass index is 36.95 kg/m.   Social History   Tobacco Use  Smoking Status Never   Passive exposure: Never  Smokeless Tobacco Never  Tobacco Comments   Patient denied use of tobacco   Tobacco Cessation:  N/A, patient does not currently use tobacco products  Blood Alcohol level:  Lab Results  Component Value Date   ETH <10 14/48/1856   Metabolic Disorder Labs:  Lab Results  Component Value Date   HGBA1C 5.4 07/27/2021   MPG 108.28 07/27/2021   No results found for: PROLACTIN Lab Results  Component Value Date   CHOL 177 07/27/2021   TRIG 172 (H) 07/27/2021   HDL 45 07/27/2021   CHOLHDL 3.9 07/27/2021   VLDL 34 07/27/2021   LDLCALC 98 07/27/2021   LDLCALC 124 (H) 12/31/2015   See Psychiatric Specialty Exam and Suicide Risk Assessment completed by Attending Physician prior to discharge.  Discharge destination:  Home  Is patient on multiple antipsychotic therapies at discharge:  No   Has Patient had three or more failed trials of antipsychotic monotherapy by history:   No  Recommended Plan for Multiple Antipsychotic Therapies: NA  Discharge Instructions     Diet - low sodium heart healthy   Complete by: As directed    Increase activity slowly   Complete by: As directed       Allergies as of 08/04/2021       Reactions   Toradol [ketorolac Tromethamine]  Itching   Buspirone Nausea And Vomiting   Lyrica [pregabalin] Nausea And Vomiting   Gabapentin Nausea And Vomiting   Tramadol Nausea And Vomiting   Zofran [ondansetron Hcl] Nausea And Vomiting   Penicillins Rash   Has patient had a PCN reaction causing immediate rash, facial/tongue/throat swelling, SOB or lightheadedness with hypotension: No Has patient had a PCN reaction causing severe rash involving mucus membranes or skin necrosis: No Has patient had a PCN reaction that required hospitalization No Has patient had a PCN reaction occurring within the last 10 years: yes If all of the above answers are "NO", then may proceed with Cephalosporin use.        Medication List     STOP taking these medications    amitriptyline 25 MG tablet Commonly known as: ELAVIL   budesonide-formoterol 160-4.5 MCG/ACT inhaler Commonly known as: SYMBICORT   cephALEXin 500 MG capsule Commonly known as: KEFLEX   clonazePAM 0.5 MG tablet Commonly known as: KLONOPIN   cyclobenzaprine 10 MG tablet Commonly known as: FLEXERIL   dicyclomine 20 MG tablet Commonly known as: BENTYL   famotidine 20 MG tablet Commonly known as: PEPCID   ferrous sulfate 324 (65 Fe) MG Tbec   Fish Oil 1000 MG Caps   HYDROcodone-acetaminophen 5-325 MG tablet Commonly known as: NORCO/VICODIN   hydrOXYzine 50 MG capsule Commonly known as: VISTARIL   megestrol 40 MG tablet Commonly known as: MEGACE   Multi-Vitamin tablet   naproxen 500 MG tablet Commonly known as: NAPROSYN   omeprazole 40 MG capsule Commonly known as: PRILOSEC   pregabalin 300 MG capsule Commonly known as: Lyrica   promethazine 25 MG  tablet Commonly known as: PHENERGAN   sertraline 100 MG tablet Commonly known as: ZOLOFT   ZANTAC PO       TAKE these medications      Indication  albuterol (2.5 MG/3ML) 0.083% nebulizer solution Commonly known as: PROVENTIL Take 3 mLs (2.5 mg total) by nebulization every 6 (six) hours as needed for wheezing or shortness of breath. What changed: Another medication with the same name was changed. Make sure you understand how and when to take each.  Indication: Spasm of Lung Air Passages   albuterol 108 (90 Base) MCG/ACT inhaler Commonly known as: VENTOLIN HFA Inhale 2 puffs into the lungs every 6 (six) hours as needed for shortness of breath or wheezing. What changed: reasons to take this  Indication: Spasm of Lung Air Passages   ARIPiprazole 20 MG tablet Commonly known as: ABILIFY Take 1 tablet (20 mg total) by mouth daily.  Indication: Major Depressive Disorder, Mood stabilization   estradiol 2 MG tablet Commonly known as: ESTRACE Take 1 tablet (2 mg total) by mouth daily.  Indication: Deficiency of the Hormone Estrogen   furosemide 20 MG tablet Commonly known as: LASIX Take 20 mg by mouth daily.  Indication: Cardiac Failure   hydrOXYzine 25 MG tablet Commonly known as: ATARAX/VISTARIL Take 1 tablet (25 mg total) by mouth 3 (three) times daily as needed for anxiety. What changed:  when to take this reasons to take this  Indication: Feeling Anxious   lisinopril 5 MG tablet Commonly known as: ZESTRIL Take 1 tablet (5 mg total) by mouth daily.  Indication: High Blood Pressure Disorder   melatonin 5 MG Tabs Take 1 tablet (5 mg total) by mouth at bedtime.  Indication: Trouble Sleeping   nicotine 21 mg/24hr patch Commonly known as: NICODERM CQ - dosed in mg/24 hours Place 1 patch (21 mg total)  onto the skin daily at 6 (six) AM.  Indication: Nicotine Addiction   nitroGLYCERIN 0.4 MG SL tablet Commonly known as: NITROSTAT Place 0.4 mg under the tongue every 5  (five) minutes as needed.  Indication: Acute Angina Pectoris   pantoprazole 40 MG tablet Commonly known as: PROTONIX Take 1 tablet (40 mg total) by mouth daily.  Indication: Gastroesophageal Reflux Disease   polyethylene glycol powder 17 GM/SCOOP powder Commonly known as: GLYCOLAX/MIRALAX Take 17 g by mouth 2 (two) times daily as needed.  Indication: Constipation   potassium chloride SA 20 MEQ tablet Commonly known as: KLOR-CON Take 1 tablet (20 mEq total) by mouth 2 (two) times daily. What changed: how much to take  Indication: Low Amount of Potassium in the Blood   Restasis 0.05 % ophthalmic emulsion Generic drug: cycloSPORINE 1 drop 2 (two) times daily.  Indication: Drying and Inflammation of Cornea and Conjunctiva of Eyes        Follow-up Information     Services, [facility]. Go on 08/08/2021.   Why: You have a hospital follow up appointment for therapy and medication management services on 08/08/21 at 11:00 am.  This appointment will be held in person. Contact information: [place] [address] [phone]         [facility]. Call.   Specialty: Hospice and Palliative Medicine Why: Please call to personally schedule an appointment for grief/bereavement therapy services. Contact information: [place] [phone]               Follow-up recommendations:  Activity:  As tolerated Diet: As recommended by your primary care doctor. Keep all scheduled follow-up appointments as recommended.   Comments: Patient is discharged to follow-up on outpatient basis as noted above.  Signed: Lindell Spar, NP, pmhnp, fnp-bc 08/04/2021, 8:59 AM

## 2021-08-04 NOTE — Plan of Care (Signed)
Nurse discussed coping skills with patient.  

## 2021-08-04 NOTE — Progress Notes (Signed)
Discharge Note:  Patient discharged home with son.  Suicide prevention information given and discussed with patient who stated she understood and had no questions.  Denied SI and HI.  Denied A/V hallucinations.  Patient stated she received all her belongings, clothing, toiletries, misc items.  Patient stated she appreciated all assistance received from [facility] staff.

## 2021-08-04 NOTE — [facility] Group Notes (Signed)
Pt. Participated in group, sharing what pt. Was thankful for. Pt. Was social with peer and staff.

## 2021-08-04 NOTE — Progress Notes (Signed)
Lynn Logan rated her day 10/10 (10 the best).  Anxiety and depression are both 0/10 (10 the worst).  She denied SI/HI or AVH.  Hydroxyzine requested at bedtime with good relief.  She is currently resting with her eyes closed and appears to be asleep.  Q 15 minute checks maintained for safety.   08/03/21 2200  Psych Admission Type (Psych Patients Only)  Admission Status Voluntary  Psychosocial Assessment  Patient Complaints Insomnia  Eye Contact Fair  Facial Expression Animated;Anxious  Affect Anxious;Appropriate to circumstance  Speech Logical/coherent  Interaction Assertive  Motor Activity Slow  Appearance/Hygiene Unremarkable  Behavior Characteristics Cooperative;Appropriate to situation  Mood Anxious  Thought Process  Coherency WDL  Content WDL  Delusions None reported or observed  Perception WDL  Hallucination None reported or observed  Judgment WDL  Confusion None  Danger to Self  Current suicidal ideation? Denies  Danger to Others  Danger to Others None reported or observed

## 2021-08-04 NOTE — Progress Notes (Signed)
  [facility] Adult Case Management Discharge Plan :  Will you be returning to the same living situation after discharge:  Yes,  to home At discharge, do you have transportation home?: Yes,  son to pick this patient up Do you have the ability to pay for your medications: Yes,  has insurance  Release of information consent forms completed and in the chart;  Patient's signature needed at discharge.  Patient to Follow up at:  Follow-up Information     Services, [facility]. Go on 08/08/2021.   Why: You have a hospital follow up appointment for therapy and medication management services on 08/08/21 at 11:00 am.  This appointment will be held in person. Contact information: [place] [address] [phone]         [facility]. Call.   Specialty: Hospice and Palliative Medicine Why: Please call to personally schedule an appointment for grief/bereavement therapy services. Contact information: [place] [phone]                Next level of care provider has access to [place] and Suicide Prevention discussed: Yes,  with son     Has patient been referred to the Quitline?: Patient refused referral  Patient has been referred for addiction treatment: Pt. refused referral  Vassie Moselle, LCSW 08/04/2021, 9:25 AM
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
## 2021-08-04 NOTE — Progress Notes (Signed)
D:  Patient's self inventory sheet, patient sleeps good, no sleep medication.  Good appetite, normal energy level, good concentration.  Rated depression, hopeless and anxiety 10.  Denied withdrawals.  Denied SI.  Denied physical problems.  Physical pain, pain medicine helpful.  Goal is discharge.  Thank you for what you do.  Does have discharge plans. A:  Medications administered per MD orders.  Emotional support and encouragement given patient.   R:  Denied SI and HI, contracts for safety.  Denied A/V hallucinations.  Safety maintained with 15 minute checks.

## 2021-08-05 NOTE — Progress Notes (Signed)
   07/27/21 1152  Psych Admission Type (Psych Patients Only)  Admission Status Voluntary  Psychosocial Assessment  Patient Complaints Anxiety  Eye Contact Fair  Facial Expression Anxious;Sad;Worried  Affect Anxious;Appropriate to circumstance  Speech Logical/coherent  Interaction Assertive  Motor Activity Slow;Unsteady  Appearance/Hygiene Disheveled  Behavior Characteristics Cooperative;Appropriate to situation  Mood Anxious  Thought Process  Coherency WDL  Content WDL  Delusions None reported or observed  Perception WDL  Hallucination None reported or observed  Judgment Impaired  Confusion None  Danger to Self  Current suicidal ideation? Denies  Danger to Others  Danger to Others None reported or observed

## 2021-08-07 DIAGNOSIS — F32A Depression, unspecified: Secondary | ICD-10-CM | POA: Diagnosis not present

## 2021-08-07 DIAGNOSIS — J449 Chronic obstructive pulmonary disease, unspecified: Secondary | ICD-10-CM | POA: Diagnosis not present

## 2021-08-07 DIAGNOSIS — Z79899 Other long term (current) drug therapy: Secondary | ICD-10-CM | POA: Diagnosis not present

## 2021-08-07 DIAGNOSIS — E870 Hyperosmolality and hypernatremia: Secondary | ICD-10-CM | POA: Diagnosis not present

## 2021-08-07 DIAGNOSIS — R55 Syncope and collapse: Secondary | ICD-10-CM | POA: Diagnosis not present

## 2021-08-07 DIAGNOSIS — I110 Hypertensive heart disease with heart failure: Secondary | ICD-10-CM | POA: Diagnosis not present

## 2021-08-07 DIAGNOSIS — Z880 Allergy status to penicillin: Secondary | ICD-10-CM | POA: Diagnosis not present

## 2021-08-07 DIAGNOSIS — Z20822 Contact with and (suspected) exposure to covid-19: Secondary | ICD-10-CM | POA: Diagnosis not present

## 2021-08-07 DIAGNOSIS — M25552 Pain in left hip: Secondary | ICD-10-CM | POA: Diagnosis not present

## 2021-08-07 DIAGNOSIS — I509 Heart failure, unspecified: Secondary | ICD-10-CM | POA: Diagnosis not present

## 2021-08-07 DIAGNOSIS — E871 Hypo-osmolality and hyponatremia: Secondary | ICD-10-CM | POA: Diagnosis not present

## 2021-08-07 DIAGNOSIS — R079 Chest pain, unspecified: Secondary | ICD-10-CM | POA: Diagnosis not present

## 2021-08-07 DIAGNOSIS — I517 Cardiomegaly: Secondary | ICD-10-CM | POA: Diagnosis not present

## 2021-08-07 DIAGNOSIS — Z885 Allergy status to narcotic agent status: Secondary | ICD-10-CM | POA: Diagnosis not present

## 2021-08-07 DIAGNOSIS — W19XXXA Unspecified fall, initial encounter: Secondary | ICD-10-CM | POA: Diagnosis not present

## 2021-08-07 DIAGNOSIS — M419 Scoliosis, unspecified: Secondary | ICD-10-CM | POA: Diagnosis not present

## 2021-08-07 DIAGNOSIS — F17210 Nicotine dependence, cigarettes, uncomplicated: Secondary | ICD-10-CM | POA: Diagnosis not present

## 2021-08-07 DIAGNOSIS — R0789 Other chest pain: Secondary | ICD-10-CM | POA: Diagnosis not present

## 2021-08-07 DIAGNOSIS — Z888 Allergy status to other drugs, medicaments and biological substances status: Secondary | ICD-10-CM | POA: Diagnosis not present

## 2021-08-08 DIAGNOSIS — R55 Syncope and collapse: Secondary | ICD-10-CM | POA: Diagnosis not present

## 2021-08-17 ENCOUNTER — Ambulatory Visit: Payer: Medicare Other | Admitting: Orthopaedic Surgery

## 2021-08-17 DIAGNOSIS — B354 Tinea corporis: Secondary | ICD-10-CM | POA: Diagnosis not present

## 2021-08-17 DIAGNOSIS — I509 Heart failure, unspecified: Secondary | ICD-10-CM | POA: Diagnosis not present

## 2021-08-17 DIAGNOSIS — F17210 Nicotine dependence, cigarettes, uncomplicated: Secondary | ICD-10-CM | POA: Diagnosis not present

## 2021-08-17 DIAGNOSIS — I10 Essential (primary) hypertension: Secondary | ICD-10-CM | POA: Diagnosis not present

## 2021-08-17 DIAGNOSIS — Z299 Encounter for prophylactic measures, unspecified: Secondary | ICD-10-CM | POA: Diagnosis not present

## 2021-08-18 DIAGNOSIS — T148XXA Other injury of unspecified body region, initial encounter: Secondary | ICD-10-CM | POA: Diagnosis not present

## 2021-08-18 DIAGNOSIS — W19XXXA Unspecified fall, initial encounter: Secondary | ICD-10-CM | POA: Diagnosis not present

## 2021-08-18 DIAGNOSIS — J449 Chronic obstructive pulmonary disease, unspecified: Secondary | ICD-10-CM | POA: Diagnosis not present

## 2021-08-25 DIAGNOSIS — K5900 Constipation, unspecified: Secondary | ICD-10-CM | POA: Diagnosis not present

## 2021-08-25 DIAGNOSIS — I10 Essential (primary) hypertension: Secondary | ICD-10-CM | POA: Diagnosis not present

## 2021-08-25 DIAGNOSIS — Z299 Encounter for prophylactic measures, unspecified: Secondary | ICD-10-CM | POA: Diagnosis not present

## 2021-08-25 DIAGNOSIS — F17210 Nicotine dependence, cigarettes, uncomplicated: Secondary | ICD-10-CM | POA: Diagnosis not present

## 2021-09-01 ENCOUNTER — Telehealth: Payer: Self-pay | Admitting: Orthopaedic Surgery

## 2021-09-01 NOTE — Telephone Encounter (Signed)
Patient requests refills - states on Hydrocodone and also Naproxen; however, she has missed her last 3 appointments - said the one was due to being in the hospital. She has rescheduled appointment to 09/14/21/[place] office. States uses [facility], if Dr Luna Glasgow would review and refill.

## 2021-09-02 NOTE — Telephone Encounter (Signed)
Called patient same day 09/01/21 per Dr Brooke Bonito response; voiced understanding.

## 2021-09-09 DIAGNOSIS — R55 Syncope and collapse: Secondary | ICD-10-CM | POA: Diagnosis not present

## 2021-09-09 DIAGNOSIS — R42 Dizziness and giddiness: Secondary | ICD-10-CM | POA: Diagnosis not present

## 2021-09-14 ENCOUNTER — Ambulatory Visit (INDEPENDENT_AMBULATORY_CARE_PROVIDER_SITE_OTHER): Payer: Medicare Other | Admitting: Orthopaedic Surgery

## 2021-09-14 ENCOUNTER — Other Ambulatory Visit: Payer: Self-pay

## 2021-09-14 ENCOUNTER — Encounter: Payer: Self-pay | Admitting: Orthopaedic Surgery

## 2021-09-14 VITALS — Ht 62.0 in | Wt 160.0 lb

## 2021-09-14 DIAGNOSIS — M25561 Pain in right knee: Secondary | ICD-10-CM

## 2021-09-14 DIAGNOSIS — M25562 Pain in left knee: Secondary | ICD-10-CM | POA: Diagnosis not present

## 2021-09-14 DIAGNOSIS — G8929 Other chronic pain: Secondary | ICD-10-CM

## 2021-09-14 MED ORDER — HYDROCODONE-ACETAMINOPHEN 5-325 MG PO TABS
ORAL_TABLET | ORAL | 0 refills | Status: DC
Start: 2021-09-14 — End: 2021-10-17

## 2021-09-14 MED ORDER — NAPROXEN 500 MG PO TABS: 500.0000 mg | ORAL_TABLET | Freq: Two times a day (BID) | ORAL | 5 refills | Status: DC

## 2021-09-14 NOTE — Progress Notes (Signed)
PROCEDURE NOTE:  The patient requests injections of the left knee , verbal consent was obtained.  The left knee was prepped appropriately after time out was performed.   Sterile technique was observed and injection of 1 cc of DepoMedrol 40 mg with several cc's of plain xylocaine. Anesthesia was provided by ethyl chloride and a 20-gauge needle was used to inject the knee area. The injection was tolerated well.  A band aid dressing was applied.  The patient was advised to apply ice later today and tomorrow to the injection sight as needed. PROCEDURE NOTE:  The patient requests injections of the right knee , verbal consent was obtained.  The right knee was prepped appropriately after time out was performed.   Sterile technique was observed and injection of 1 cc of DepoMedrol 40mg  with several cc's of plain xylocaine. Anesthesia was provided by ethyl chloride and a 20-gauge needle was used to inject the knee area. The injection was tolerated well.  A band aid dressing was applied.  The patient was advised to apply ice later today and tomorrow to the injection sight as needed.  Encounter Diagnosis  Name Primary?   Bilateral chronic knee pain Yes   I have reviewed the [facility] web site prior to prescribing narcotic medicine for this patient.  Return in three months.  I have also renewed her Naprosyn.  Call if any problem.  Precautions discussed.  Electronically Signed Sanjuana Kava, MD 1/4/202310:31 AM

## 2021-10-17 ENCOUNTER — Telehealth: Payer: Self-pay

## 2021-10-17 MED ORDER — HYDROCODONE-ACETAMINOPHEN 5-325 MG PO TABS: ORAL_TABLET | ORAL | 0 refills | Status: DC

## 2021-10-17 NOTE — Telephone Encounter (Signed)
Hydrocodone-Acetaminophen 5/325 mg  Qty 28 tablets  PATIENT USES [facility]

## 2021-12-21 ENCOUNTER — Ambulatory Visit: Payer: Medicaid Other | Admitting: Orthopaedic Surgery

## 2022-02-11 ENCOUNTER — Encounter (HOSPITAL_COMMUNITY): Payer: Self-pay

## 2022-02-11 ENCOUNTER — Emergency Department (HOSPITAL_COMMUNITY)
Admission: EM | Admit: 2022-02-11 | Discharge: 2022-02-11 | Disposition: A | Discharge status: Home / Self Care | Payer: Medicare Other | Attending: Emergency Medicine | Admitting: Emergency Medicine

## 2022-02-11 ENCOUNTER — Other Ambulatory Visit: Payer: Self-pay

## 2022-02-11 DIAGNOSIS — R5381 Other malaise: Secondary | ICD-10-CM | POA: Diagnosis not present

## 2022-02-11 DIAGNOSIS — F419 Anxiety disorder, unspecified: Secondary | ICD-10-CM | POA: Diagnosis not present

## 2022-02-11 DIAGNOSIS — Z743 Need for continuous supervision: Secondary | ICD-10-CM | POA: Diagnosis not present

## 2022-02-11 MED ORDER — HYDROXYZINE HCL 25 MG PO TABS
25.0000 mg | ORAL_TABLET | Freq: Once | ORAL | Status: AC
  Administered 2022-02-11: 25 mg via ORAL
  Filled 2022-02-11: qty 1

## 2022-02-11 MED ORDER — HYDROXYZINE HCL 25 MG PO TABS: 25.0000 mg | ORAL_TABLET | Freq: Three times a day (TID) | ORAL | 0 refills | Status: DC | PRN

## 2022-02-11 NOTE — ED Triage Notes (Signed)
Pt bib ems for anxiety.  Pt states that she has been out of her anxiety medication x 4 days.  Does not know what medication she takes for anxiety.  Resp even and unlabored.  Skin warm and dry.  nad

## 2022-02-11 NOTE — ED Provider Notes (Signed)
[facility] EMERGENCY DEPARTMENT Provider Note   CSN: [id number] Arrival date & time: 02/11/22  1918     History  Chief Complaint  Patient presents with   Anxiety    Lynn Logan is a 51 y.o. female.   Anxiety   This patient is a 51 year old female presenting with a feeling of anxiety, she has been out of her medication for approximately 4 days, states it is hydroxyzine, she cannot tell me why she is out of it, she does not have any signs of withdrawal, she does not take benzodiazepines according to her report.  Review of the medical record does not show any benzodiazepines.  She denies suicidality or hallucinations.  She is requesting a dose prior to discharge and until she can see [facility] on Monday  Home Medications Prior to Admission medications   Medication Sig Start Date End Date Taking? Authorizing Provider  ARIPiprazole (ABILIFY) 20 MG tablet Take 1 tablet by mouth daily. 08/04/21  Yes [provider]  hydrOXYzine (ATARAX) 25 MG tablet Take 1 tablet (25 mg total) by mouth every 8 (eight) hours as needed. 02/11/22  Yes Noemi Chapel, MD  albuterol (PROVENTIL) (2.5 MG/3ML) 0.083% nebulizer solution Take 3 mLs (2.5 mg total) by nebulization every 6 (six) hours as needed for wheezing or shortness of breath. 03/29/16   Eustaquio Maize, MD  albuterol (VENTOLIN HFA) 108 (90 Base) MCG/ACT inhaler Inhale 2 puffs into the lungs every 6 (six) hours as needed for shortness of breath or wheezing. 08/03/21   Ethelene Hal, NP  ARIPiprazole (ABILIFY) 20 MG tablet Take 1 tablet (20 mg total) by mouth daily. 08/04/21   Ethelene Hal, NP  estradiol (ESTRACE) 2 MG tablet Take 1 tablet (2 mg total) by mouth daily. 01/30/17   Florian Buff, MD  furosemide (LASIX) 20 MG tablet Take 20 mg by mouth daily. 07/11/21   [provider]  HYDROcodone-acetaminophen (NORCO/VICODIN) 5-325 MG tablet One tablet every six hours for pain.  Limit 7 days. 10/17/21   Sanjuana Kava,  MD  lisinopril (ZESTRIL) 5 MG tablet Take 1 tablet (5 mg total) by mouth daily. 08/04/21   Ethelene Hal, NP  melatonin 5 MG TABS Take 1 tablet (5 mg total) by mouth at bedtime. 08/03/21   Ethelene Hal, NP  naproxen (NAPROSYN) 500 MG tablet Take 1 tablet (500 mg total) by mouth 2 (two) times daily with a meal. 09/14/21   Sanjuana Kava, MD  nicotine (NICODERM CQ - DOSED IN MG/24 HOURS) 21 mg/24hr patch Place 1 patch (21 mg total) onto the skin daily at 6 (six) AM. 08/04/21   Ethelene Hal, NP  nitroGLYCERIN (NITROSTAT) 0.4 MG SL tablet Place 0.4 mg under the tongue every 5 (five) minutes as needed. 07/12/21   [provider]  pantoprazole (PROTONIX) 40 MG tablet Take 1 tablet (40 mg total) by mouth daily. 08/03/21   Ethelene Hal, NP  polyethylene glycol powder (GLYCOLAX/MIRALAX) powder Take 17 g by mouth 2 (two) times daily as needed. 03/29/16   Eustaquio Maize, MD  potassium chloride SA (KLOR-CON) 20 MEQ tablet Take 1 tablet (20 mEq total) by mouth 2 (two) times daily. 08/03/21   Ethelene Hal, NP  RESTASIS 0.05 % ophthalmic emulsion 1 drop 2 (two) times daily. 07/13/21   [provider]      Allergies    Toradol [ketorolac tromethamine], Buspirone, Lyrica [pregabalin], Gabapentin, Tramadol, Zofran [ondansetron hcl], and Penicillins    Review of  Systems   Review of Systems  All other systems reviewed and are negative.  Physical Exam Updated Vital Signs BP 108/86   Pulse 99   Temp 97.8 F (36.6 C) (Oral)   Resp 20   Ht 1.575 m ('5\' 2"'$ )   Wt 93 kg   LMP 09/12/2015 (Exact Date)   SpO2 98%   BMI 37.49 kg/m  Physical Exam Vitals and nursing note reviewed.  Constitutional:      General: She is not in acute distress.    Appearance: She is well-developed.  HENT:     Head: Normocephalic and atraumatic.     Mouth/Throat:     Pharynx: No oropharyngeal exudate.  Eyes:     General: No scleral icterus.       Right eye: No discharge.         Left eye: No discharge.     Conjunctiva/sclera: Conjunctivae normal.     Pupils: Pupils are equal, round, and reactive to light.  Neck:     Thyroid: No thyromegaly.     Vascular: No JVD.  Cardiovascular:     Rate and Rhythm: Normal rate and regular rhythm.     Heart sounds: Normal heart sounds. No murmur heard.   No friction rub. No gallop.  Pulmonary:     Effort: Pulmonary effort is normal. No respiratory distress.     Breath sounds: Normal breath sounds. No wheezing or rales.  Abdominal:     General: Bowel sounds are normal. There is no distension.     Palpations: Abdomen is soft. There is no mass.     Tenderness: There is no abdominal tenderness.  Musculoskeletal:        General: No tenderness. Normal range of motion.     Cervical back: Normal range of motion and neck supple.  Lymphadenopathy:     Cervical: No cervical adenopathy.  Skin:    General: Skin is warm and dry.     Findings: No erythema or rash.  Neurological:     Mental Status: She is alert.     Coordination: Coordination normal.  Psychiatric:     Comments: Flat affect, not suicidal, states she is anxious    ED Results / Procedures / Treatments   Labs (all labs ordered are listed, but only abnormal results are displayed) Labs Reviewed - No data to display  EKG None  Radiology No results found.  Procedures Procedures    Medications Ordered in ED Medications  hydrOXYzine (ATARAX) tablet 25 mg (has no administration in time range)    ED Course/ Medical Decision Making/ A&P                           Medical Decision Making Risk Prescription drug management.   No distress, dose of Vistaril given, stable for discharge, several days prescribed, patient agreeable, no signs of benzo withdrawal, vital signs unremarkable and reassuring        Final Clinical Impression(s) / ED Diagnoses Final diagnoses:  Anxiety    Rx / DC Orders ED Discharge Orders          Ordered    hydrOXYzine  (ATARAX) 25 MG tablet  Every 8 hours PRN        02/11/22 2044              Noemi Chapel, MD 02/11/22 2045

## 2022-02-11 NOTE — Discharge Instructions (Signed)
You will need to go to [facility] for further refills

## 2022-02-25 DIAGNOSIS — F17210 Nicotine dependence, cigarettes, uncomplicated: Secondary | ICD-10-CM | POA: Diagnosis not present

## 2022-02-25 DIAGNOSIS — M419 Scoliosis, unspecified: Secondary | ICD-10-CM | POA: Diagnosis not present

## 2022-02-25 DIAGNOSIS — R9431 Abnormal electrocardiogram [ECG] [EKG]: Secondary | ICD-10-CM | POA: Diagnosis not present

## 2022-02-25 DIAGNOSIS — F32A Depression, unspecified: Secondary | ICD-10-CM | POA: Diagnosis not present

## 2022-02-25 DIAGNOSIS — Z7951 Long term (current) use of inhaled steroids: Secondary | ICD-10-CM | POA: Diagnosis not present

## 2022-02-25 DIAGNOSIS — I509 Heart failure, unspecified: Secondary | ICD-10-CM | POA: Diagnosis not present

## 2022-02-25 DIAGNOSIS — J449 Chronic obstructive pulmonary disease, unspecified: Secondary | ICD-10-CM | POA: Diagnosis not present

## 2022-02-25 DIAGNOSIS — Z79899 Other long term (current) drug therapy: Secondary | ICD-10-CM | POA: Diagnosis not present

## 2022-03-17 DIAGNOSIS — F17210 Nicotine dependence, cigarettes, uncomplicated: Secondary | ICD-10-CM | POA: Diagnosis not present

## 2022-04-20 DIAGNOSIS — L03811 Cellulitis of head [any part, except face]: Secondary | ICD-10-CM | POA: Diagnosis not present

## 2022-04-20 DIAGNOSIS — Z880 Allergy status to penicillin: Secondary | ICD-10-CM | POA: Diagnosis not present

## 2022-04-20 DIAGNOSIS — I509 Heart failure, unspecified: Secondary | ICD-10-CM | POA: Diagnosis not present

## 2022-04-20 DIAGNOSIS — J449 Chronic obstructive pulmonary disease, unspecified: Secondary | ICD-10-CM | POA: Diagnosis not present

## 2022-04-20 DIAGNOSIS — G4489 Other headache syndrome: Secondary | ICD-10-CM | POA: Diagnosis not present

## 2022-04-20 DIAGNOSIS — Z885 Allergy status to narcotic agent status: Secondary | ICD-10-CM | POA: Diagnosis not present

## 2022-04-20 DIAGNOSIS — F17210 Nicotine dependence, cigarettes, uncomplicated: Secondary | ICD-10-CM | POA: Diagnosis not present

## 2022-04-20 DIAGNOSIS — Z79899 Other long term (current) drug therapy: Secondary | ICD-10-CM | POA: Diagnosis not present

## 2022-04-20 DIAGNOSIS — R609 Edema, unspecified: Secondary | ICD-10-CM | POA: Diagnosis not present

## 2022-04-20 DIAGNOSIS — R404 Transient alteration of awareness: Secondary | ICD-10-CM | POA: Diagnosis not present

## 2022-04-20 DIAGNOSIS — Z743 Need for continuous supervision: Secondary | ICD-10-CM | POA: Diagnosis not present

## 2022-04-20 DIAGNOSIS — L02811 Cutaneous abscess of head [any part, except face]: Secondary | ICD-10-CM | POA: Diagnosis not present

## 2022-04-20 DIAGNOSIS — Z888 Allergy status to other drugs, medicaments and biological substances status: Secondary | ICD-10-CM | POA: Diagnosis not present

## 2022-04-20 DIAGNOSIS — R52 Pain, unspecified: Secondary | ICD-10-CM | POA: Diagnosis not present

## 2022-04-23 DIAGNOSIS — Z885 Allergy status to narcotic agent status: Secondary | ICD-10-CM | POA: Diagnosis not present

## 2022-04-23 DIAGNOSIS — Z880 Allergy status to penicillin: Secondary | ICD-10-CM | POA: Diagnosis not present

## 2022-04-23 DIAGNOSIS — L02811 Cutaneous abscess of head [any part, except face]: Secondary | ICD-10-CM | POA: Diagnosis not present

## 2022-04-23 DIAGNOSIS — F32A Depression, unspecified: Secondary | ICD-10-CM | POA: Diagnosis not present

## 2022-04-23 DIAGNOSIS — J449 Chronic obstructive pulmonary disease, unspecified: Secondary | ICD-10-CM | POA: Diagnosis not present

## 2022-04-23 DIAGNOSIS — F17210 Nicotine dependence, cigarettes, uncomplicated: Secondary | ICD-10-CM | POA: Diagnosis not present

## 2022-04-23 DIAGNOSIS — Z79899 Other long term (current) drug therapy: Secondary | ICD-10-CM | POA: Diagnosis not present

## 2022-04-23 DIAGNOSIS — I509 Heart failure, unspecified: Secondary | ICD-10-CM | POA: Diagnosis not present

## 2022-04-23 DIAGNOSIS — Z888 Allergy status to other drugs, medicaments and biological substances status: Secondary | ICD-10-CM | POA: Diagnosis not present

## 2022-05-02 DIAGNOSIS — J449 Chronic obstructive pulmonary disease, unspecified: Secondary | ICD-10-CM | POA: Diagnosis not present

## 2022-05-02 DIAGNOSIS — Z7951 Long term (current) use of inhaled steroids: Secondary | ICD-10-CM | POA: Diagnosis not present

## 2022-05-02 DIAGNOSIS — L02811 Cutaneous abscess of head [any part, except face]: Secondary | ICD-10-CM | POA: Diagnosis not present

## 2022-05-02 DIAGNOSIS — F17210 Nicotine dependence, cigarettes, uncomplicated: Secondary | ICD-10-CM | POA: Diagnosis not present

## 2022-05-02 DIAGNOSIS — Z880 Allergy status to penicillin: Secondary | ICD-10-CM | POA: Diagnosis not present

## 2022-05-02 DIAGNOSIS — I509 Heart failure, unspecified: Secondary | ICD-10-CM | POA: Diagnosis not present

## 2022-06-06 DIAGNOSIS — Z885 Allergy status to narcotic agent status: Secondary | ICD-10-CM | POA: Diagnosis not present

## 2022-06-06 DIAGNOSIS — I509 Heart failure, unspecified: Secondary | ICD-10-CM | POA: Diagnosis not present

## 2022-06-06 DIAGNOSIS — Z7951 Long term (current) use of inhaled steroids: Secondary | ICD-10-CM | POA: Diagnosis not present

## 2022-06-06 DIAGNOSIS — F32A Depression, unspecified: Secondary | ICD-10-CM | POA: Diagnosis not present

## 2022-06-06 DIAGNOSIS — K047 Periapical abscess without sinus: Secondary | ICD-10-CM | POA: Diagnosis not present

## 2022-06-06 DIAGNOSIS — Z880 Allergy status to penicillin: Secondary | ICD-10-CM | POA: Diagnosis not present

## 2022-06-06 DIAGNOSIS — J449 Chronic obstructive pulmonary disease, unspecified: Secondary | ICD-10-CM | POA: Diagnosis not present

## 2022-08-01 ENCOUNTER — Other Ambulatory Visit: Payer: Self-pay

## 2022-08-01 ENCOUNTER — Emergency Department (HOSPITAL_COMMUNITY)
Admission: EM | Admit: 2022-08-01 | Discharge: 2022-08-02 | Disposition: A | Discharge status: Home / Self Care | Payer: Medicare Other | Attending: Emergency Medicine | Admitting: Emergency Medicine

## 2022-08-01 DIAGNOSIS — J449 Chronic obstructive pulmonary disease, unspecified: Secondary | ICD-10-CM | POA: Diagnosis not present

## 2022-08-01 DIAGNOSIS — Z760 Encounter for issue of repeat prescription: Secondary | ICD-10-CM | POA: Insufficient documentation

## 2022-08-01 DIAGNOSIS — I509 Heart failure, unspecified: Secondary | ICD-10-CM | POA: Insufficient documentation

## 2022-08-01 DIAGNOSIS — Z85038 Personal history of other malignant neoplasm of large intestine: Secondary | ICD-10-CM | POA: Diagnosis not present

## 2022-08-01 DIAGNOSIS — J45909 Unspecified asthma, uncomplicated: Secondary | ICD-10-CM | POA: Insufficient documentation

## 2022-08-01 DIAGNOSIS — Z853 Personal history of malignant neoplasm of breast: Secondary | ICD-10-CM | POA: Diagnosis not present

## 2022-08-01 NOTE — ED Triage Notes (Addendum)
POV from home. Cc of depression and anxiety for a couple weeks. Worse today. States that she normally takes vistaril but has ran out. Wants a refill. Denies needing any other med refilled.. No SI/HI

## 2022-08-02 DIAGNOSIS — Z760 Encounter for issue of repeat prescription: Secondary | ICD-10-CM | POA: Diagnosis not present

## 2022-08-02 MED ORDER — HYDROXYZINE HCL 25 MG PO TABS
50.0000 mg | ORAL_TABLET | Freq: Once | ORAL | Status: AC
  Administered 2022-08-02: 50 mg via ORAL
  Filled 2022-08-02: qty 2

## 2022-08-02 MED ORDER — HYDROXYZINE HCL 25 MG PO TABS: 25.0000 mg | ORAL_TABLET | Freq: Three times a day (TID) | ORAL | 1 refills | Status: DC | PRN

## 2022-08-02 NOTE — ED Provider Notes (Signed)
[facility] Emergency Department Provider Note MRN:  [id number]  Arrival date & time: 08/02/22     Chief Complaint   Med refill History of Present Illness   Lynn Logan is a 51 y.o. year-old female with a history of CHF, anxiety, depression presenting to the ED with chief complaint of med refill.  Patient explains that she has been using Atarax as needed for anxiety recently and it has been working.  She ran out several days ago and wants a refill.  No other complaints.  Denies SI or HI.  Review of Systems  A thorough review of systems was obtained and all systems are negative except as noted in the HPI and PMH.   Patient's Health History    Past Medical History:  Diagnosis Date   Anxiety    Arthritis    Rheumatoid   Asthma    Breast cancer ([place])    CHF (congestive heart failure) (HCC)    Chronic back pain    Colon cancer (HCC)    COPD (chronic obstructive pulmonary disease) ([place])    Depression    Fibromyalgia    Gout    Heart attack ([place]) 2017   Renal disorder    Right lumbar radiculopathy    Vitiligo    face    Past Surgical History:  Procedure Laterality Date   CESAREAN SECTION     DILITATION & CURRETTAGE/HYSTROSCOPY WITH NOVASURE ABLATION N/A 07/07/2015   Procedure: HYSTEROSCOPY, UTERINE CURETTAGE, ENDOMETRIAL  ABLATION Uterine Cavity Length=6.5cm Uterine Cavity Width=4.5cm Power=161 Watts Time=1 minute 19 seconds;  Surgeon: Florian Buff, MD;  Location: [facility];  Service: Gynecology;  Laterality: N/A;   POLYPECTOMY  07/07/2015   Procedure: POLYPECTOMY;  Surgeon: Florian Buff, MD;  Location: [facility];  Service: Gynecology;;   SALPINGOOPHORECTOMY Bilateral 10/13/2015   Procedure: BILATERAL SALPINGO OOPHORECTOMY;  Surgeon: Florian Buff, MD;  Location: [facility];  Service: Gynecology;  Laterality: Bilateral;   TUBAL LIGATION     VAGINAL HYSTERECTOMY N/A 10/13/2015   Procedure: HYSTERECTOMY VAGINAL;  Surgeon: Florian Buff, MD;  Location: [facility];  Service: Gynecology;  Laterality: N/A;    Family History  Problem Relation Age of Onset   Diabetes Mother    Congestive Heart Failure Mother    Depression Father    Hypertension Father    Cancer Father    Heart disease Maternal Uncle    Depression Maternal Grandmother     Social History   Socioeconomic History   Marital status: Divorced    Spouse name: Not on file   Number of children: 2   Years of education: 11   Highest education level: 11th grade  Occupational History   Not on file  Tobacco Use   Smoking status: Never    Passive exposure: Never   Smokeless tobacco: Never   Tobacco comments:    Patient denied use of tobacco  Substance and Sexual Activity   Alcohol use: No    Alcohol/week: 0.0 standard drinks of alcohol   Drug use: No    Comment: Hx of marijuana use - None now   Sexual activity: Not Currently    Birth control/protection: Surgical  Other Topics Concern   Not on file  Social History Narrative   Lives with daughter in [place], [place].  Denied use of tobacco, alcohol, all drug use.   Social Determinants of Health   Financial Resource Strain: Not on file  Food Insecurity: Not on file  Transportation Needs:  Not on file  Physical Activity: Not on file  Stress: Not on file  Social Connections: Not on file  Intimate Partner Violence: Not on file     Physical Exam   Vitals:   08/01/22 2117 08/02/22 0006  BP: (!) 130/102 (!) 157/99  Pulse: 82 73  Resp: 20 18  Temp: 98.3 F (36.8 C)   SpO2: 97% 97%    CONSTITUTIONAL: Well-appearing, NAD NEURO/PSYCH:  Alert and oriented x 3, no focal deficits EYES:  eyes equal and reactive ENT/NECK:  no LAD, no JVD CARDIO: Regular rate, well-perfused, normal S1 and S2 PULM:  CTAB no wheezing or rhonchi GI/GU:  non-distended, non-tender MSK/SPINE:  No gross deformities, no edema SKIN:  no rash, atraumatic   *Additional and/or pertinent findings included in MDM below  Diagnostic and Interventional  Summary    EKG Interpretation  Date/Time:    Ventricular Rate:    PR Interval:    QRS Duration:   QT Interval:    QTC Calculation:   R Axis:     Text Interpretation:         Labs Reviewed - No data to display  No orders to display    Medications  hydrOXYzine (ATARAX) tablet 50 mg (has no administration in time range)     Procedures  /  Critical Care Procedures  ED Course and Medical Decision Making  Initial Impression and Ddx Med refill without other complaints or concerns.  Normal vital signs, well-appearing.  No signs or symptoms of psychiatric crisis.  Past medical/surgical history that increases complexity of ED encounter: Anxiety depression  Interpretation of Diagnostics Laboratory and/or imaging options to aid in the diagnosis/care of the patient were considered.  After careful history and physical examination, it was determined that there was no indication for diagnostics at this time.  Patient Reassessment and Ultimate Disposition/Management     Discharge  Patient management required discussion with the following services or consulting groups:  None  Complexity of Problems Addressed Acute complicated illness or Injury  Additional Data Reviewed and Analyzed Further history obtained from: None  Additional Factors Impacting ED Encounter Risk Prescriptions  Barth Kirks. [place] Small, [place] [email or web address]  Final Clinical Impressions(s) / ED Diagnoses     ICD-10-CM   1. Medication refill  Z76.0       ED Discharge Orders          Ordered    hydrOXYzine (ATARAX) 25 MG tablet  Every 8 hours PRN        08/02/22 0128             Discharge Instructions Discussed with and Provided to Patient:    Discharge Instructions      You were evaluated in the Emergency Department and after careful evaluation, we did not find any emergent condition requiring admission or further testing in the  hospital.  Your exam/testing today is overall reassuring.  Please return to the Emergency Department if you experience any worsening of your condition.   Thank you for allowing [name] to be a part of your care.      Maudie Flakes, MD 08/02/22 0130

## 2022-08-02 NOTE — Discharge Instructions (Signed)
You were evaluated in the Emergency Department and after careful evaluation, we did not find any emergent condition requiring admission or further testing in the hospital.  Your exam/testing today is overall reassuring.  Please return to the Emergency Department if you experience any worsening of your condition.   Thank you for allowing us to be a part of your care. 

## 2022-11-06 DIAGNOSIS — I4891 Unspecified atrial fibrillation: Secondary | ICD-10-CM | POA: Diagnosis not present

## 2022-11-06 DIAGNOSIS — I10 Essential (primary) hypertension: Secondary | ICD-10-CM | POA: Diagnosis not present

## 2022-11-06 DIAGNOSIS — Z0189 Encounter for other specified special examinations: Secondary | ICD-10-CM | POA: Diagnosis not present

## 2022-11-06 DIAGNOSIS — I110 Hypertensive heart disease with heart failure: Secondary | ICD-10-CM | POA: Diagnosis not present

## 2022-11-06 DIAGNOSIS — I509 Heart failure, unspecified: Secondary | ICD-10-CM | POA: Diagnosis not present

## 2022-11-06 DIAGNOSIS — J449 Chronic obstructive pulmonary disease, unspecified: Secondary | ICD-10-CM | POA: Diagnosis not present

## 2022-11-06 DIAGNOSIS — F17210 Nicotine dependence, cigarettes, uncomplicated: Secondary | ICD-10-CM | POA: Diagnosis not present

## 2022-11-06 DIAGNOSIS — Z139 Encounter for screening, unspecified: Secondary | ICD-10-CM | POA: Diagnosis not present

## 2022-11-09 ENCOUNTER — Encounter: Payer: Self-pay | Admitting: Radiology

## 2023-01-01 DIAGNOSIS — Z139 Encounter for screening, unspecified: Secondary | ICD-10-CM | POA: Diagnosis not present

## 2023-01-08 ENCOUNTER — Other Ambulatory Visit (HOSPITAL_COMMUNITY): Payer: Self-pay | Admitting: Family Medicine

## 2023-01-08 DIAGNOSIS — R21 Rash and other nonspecific skin eruption: Secondary | ICD-10-CM | POA: Diagnosis not present

## 2023-01-08 DIAGNOSIS — F17210 Nicotine dependence, cigarettes, uncomplicated: Secondary | ICD-10-CM | POA: Diagnosis not present

## 2023-01-08 DIAGNOSIS — I10 Essential (primary) hypertension: Secondary | ICD-10-CM | POA: Diagnosis not present

## 2023-01-08 DIAGNOSIS — E782 Mixed hyperlipidemia: Secondary | ICD-10-CM | POA: Diagnosis not present

## 2023-01-08 DIAGNOSIS — I4891 Unspecified atrial fibrillation: Secondary | ICD-10-CM | POA: Diagnosis not present

## 2023-01-08 DIAGNOSIS — I509 Heart failure, unspecified: Secondary | ICD-10-CM | POA: Diagnosis not present

## 2023-01-08 DIAGNOSIS — J449 Chronic obstructive pulmonary disease, unspecified: Secondary | ICD-10-CM | POA: Diagnosis not present

## 2023-01-08 DIAGNOSIS — Z0001 Encounter for general adult medical examination with abnormal findings: Secondary | ICD-10-CM | POA: Diagnosis not present

## 2023-01-08 DIAGNOSIS — F32A Depression, unspecified: Secondary | ICD-10-CM | POA: Diagnosis not present

## 2023-01-08 DIAGNOSIS — Z1231 Encounter for screening mammogram for malignant neoplasm of breast: Secondary | ICD-10-CM

## 2023-01-15 ENCOUNTER — Ambulatory Visit (HOSPITAL_COMMUNITY): Payer: Medicare Other

## 2023-01-22 ENCOUNTER — Ambulatory Visit (HOSPITAL_COMMUNITY): Payer: Self-pay

## 2023-01-22 DIAGNOSIS — R051 Acute cough: Secondary | ICD-10-CM | POA: Diagnosis not present

## 2023-01-22 DIAGNOSIS — L981 Factitial dermatitis: Secondary | ICD-10-CM | POA: Diagnosis not present

## 2023-01-22 DIAGNOSIS — J029 Acute pharyngitis, unspecified: Secondary | ICD-10-CM | POA: Diagnosis not present

## 2023-01-25 DIAGNOSIS — Z79899 Other long term (current) drug therapy: Secondary | ICD-10-CM | POA: Diagnosis not present

## 2023-01-25 DIAGNOSIS — R42 Dizziness and giddiness: Secondary | ICD-10-CM | POA: Diagnosis not present

## 2023-01-25 DIAGNOSIS — I509 Heart failure, unspecified: Secondary | ICD-10-CM | POA: Diagnosis not present

## 2023-01-25 DIAGNOSIS — R404 Transient alteration of awareness: Secondary | ICD-10-CM | POA: Diagnosis not present

## 2023-01-25 DIAGNOSIS — R079 Chest pain, unspecified: Secondary | ICD-10-CM | POA: Diagnosis not present

## 2023-01-25 DIAGNOSIS — Z743 Need for continuous supervision: Secondary | ICD-10-CM | POA: Diagnosis not present

## 2023-01-25 DIAGNOSIS — F17210 Nicotine dependence, cigarettes, uncomplicated: Secondary | ICD-10-CM | POA: Diagnosis not present

## 2023-01-25 DIAGNOSIS — R110 Nausea: Secondary | ICD-10-CM | POA: Diagnosis not present

## 2023-01-25 DIAGNOSIS — J449 Chronic obstructive pulmonary disease, unspecified: Secondary | ICD-10-CM | POA: Diagnosis not present

## 2023-02-19 ENCOUNTER — Other Ambulatory Visit (HOSPITAL_COMMUNITY): Payer: Self-pay | Admitting: Family Medicine

## 2023-02-19 DIAGNOSIS — R911 Solitary pulmonary nodule: Secondary | ICD-10-CM | POA: Diagnosis not present

## 2023-02-19 DIAGNOSIS — F32A Depression, unspecified: Secondary | ICD-10-CM | POA: Diagnosis not present

## 2023-02-19 DIAGNOSIS — Z0000 Encounter for general adult medical examination without abnormal findings: Secondary | ICD-10-CM | POA: Diagnosis not present

## 2023-02-19 DIAGNOSIS — R109 Unspecified abdominal pain: Secondary | ICD-10-CM | POA: Diagnosis not present

## 2023-02-19 DIAGNOSIS — R42 Dizziness and giddiness: Secondary | ICD-10-CM | POA: Diagnosis not present

## 2023-02-19 DIAGNOSIS — Z0001 Encounter for general adult medical examination with abnormal findings: Secondary | ICD-10-CM | POA: Diagnosis not present

## 2023-04-09 ENCOUNTER — Telehealth (HOSPITAL_COMMUNITY): Payer: Self-pay | Admitting: Psychiatry

## 2023-04-09 ENCOUNTER — Ambulatory Visit (HOSPITAL_COMMUNITY): Payer: 59 | Admitting: Psychiatry
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
## 2023-04-18 DIAGNOSIS — M7989 Other specified soft tissue disorders: Secondary | ICD-10-CM | POA: Diagnosis not present

## 2023-04-18 DIAGNOSIS — R438 Other disturbances of smell and taste: Secondary | ICD-10-CM | POA: Diagnosis not present

## 2023-04-19 DIAGNOSIS — Z886 Allergy status to analgesic agent status: Secondary | ICD-10-CM | POA: Diagnosis not present

## 2023-04-19 DIAGNOSIS — R609 Edema, unspecified: Secondary | ICD-10-CM | POA: Diagnosis not present

## 2023-04-19 DIAGNOSIS — R109 Unspecified abdominal pain: Secondary | ICD-10-CM | POA: Diagnosis not present

## 2023-04-19 DIAGNOSIS — Z888 Allergy status to other drugs, medicaments and biological substances status: Secondary | ICD-10-CM | POA: Diagnosis not present

## 2023-04-19 DIAGNOSIS — R0789 Other chest pain: Secondary | ICD-10-CM | POA: Diagnosis not present

## 2023-04-19 DIAGNOSIS — R1111 Vomiting without nausea: Secondary | ICD-10-CM | POA: Diagnosis not present

## 2023-04-19 DIAGNOSIS — I10 Essential (primary) hypertension: Secondary | ICD-10-CM | POA: Diagnosis not present

## 2023-04-19 DIAGNOSIS — Z880 Allergy status to penicillin: Secondary | ICD-10-CM | POA: Diagnosis not present

## 2023-04-19 DIAGNOSIS — F17200 Nicotine dependence, unspecified, uncomplicated: Secondary | ICD-10-CM | POA: Diagnosis not present

## 2023-04-19 DIAGNOSIS — Z743 Need for continuous supervision: Secondary | ICD-10-CM | POA: Diagnosis not present

## 2023-04-19 DIAGNOSIS — R58 Hemorrhage, not elsewhere classified: Secondary | ICD-10-CM | POA: Diagnosis not present

## 2023-04-19 DIAGNOSIS — R079 Chest pain, unspecified: Secondary | ICD-10-CM | POA: Diagnosis not present

## 2023-04-25 DIAGNOSIS — R079 Chest pain, unspecified: Secondary | ICD-10-CM | POA: Diagnosis not present

## 2023-04-25 DIAGNOSIS — I5032 Chronic diastolic (congestive) heart failure: Secondary | ICD-10-CM | POA: Diagnosis not present

## 2023-04-25 DIAGNOSIS — F17200 Nicotine dependence, unspecified, uncomplicated: Secondary | ICD-10-CM | POA: Diagnosis not present

## 2023-05-01 DIAGNOSIS — Z886 Allergy status to analgesic agent status: Secondary | ICD-10-CM | POA: Diagnosis not present

## 2023-05-01 DIAGNOSIS — Z888 Allergy status to other drugs, medicaments and biological substances status: Secondary | ICD-10-CM | POA: Diagnosis not present

## 2023-05-01 DIAGNOSIS — Z880 Allergy status to penicillin: Secondary | ICD-10-CM | POA: Diagnosis not present

## 2023-05-07 DIAGNOSIS — Z888 Allergy status to other drugs, medicaments and biological substances status: Secondary | ICD-10-CM | POA: Diagnosis not present

## 2023-05-07 DIAGNOSIS — Z743 Need for continuous supervision: Secondary | ICD-10-CM | POA: Diagnosis not present

## 2023-05-07 DIAGNOSIS — Z880 Allergy status to penicillin: Secondary | ICD-10-CM | POA: Diagnosis not present

## 2023-05-07 DIAGNOSIS — Z886 Allergy status to analgesic agent status: Secondary | ICD-10-CM | POA: Diagnosis not present

## 2023-05-29 DIAGNOSIS — R9439 Abnormal result of other cardiovascular function study: Secondary | ICD-10-CM | POA: Diagnosis not present

## 2023-05-30 DIAGNOSIS — R079 Chest pain, unspecified: Secondary | ICD-10-CM | POA: Diagnosis not present

## 2023-06-07 DIAGNOSIS — E559 Vitamin D deficiency, unspecified: Secondary | ICD-10-CM | POA: Diagnosis not present

## 2023-06-07 DIAGNOSIS — J449 Chronic obstructive pulmonary disease, unspecified: Secondary | ICD-10-CM | POA: Diagnosis not present

## 2023-06-07 DIAGNOSIS — I4891 Unspecified atrial fibrillation: Secondary | ICD-10-CM | POA: Diagnosis not present

## 2023-06-07 DIAGNOSIS — R7309 Other abnormal glucose: Secondary | ICD-10-CM | POA: Diagnosis not present

## 2023-06-07 DIAGNOSIS — Z79899 Other long term (current) drug therapy: Secondary | ICD-10-CM | POA: Diagnosis not present

## 2023-06-07 DIAGNOSIS — I509 Heart failure, unspecified: Secondary | ICD-10-CM | POA: Diagnosis not present

## 2023-06-07 DIAGNOSIS — I10 Essential (primary) hypertension: Secondary | ICD-10-CM | POA: Diagnosis not present

## 2023-06-07 DIAGNOSIS — F17200 Nicotine dependence, unspecified, uncomplicated: Secondary | ICD-10-CM | POA: Diagnosis not present

## 2023-06-07 DIAGNOSIS — R296 Repeated falls: Secondary | ICD-10-CM | POA: Diagnosis not present

## 2023-06-07 DIAGNOSIS — Z7689 Persons encountering health services in other specified circumstances: Secondary | ICD-10-CM | POA: Diagnosis not present

## 2023-07-26 ENCOUNTER — Ambulatory Visit: Payer: 59 | Admitting: Orthopaedic Surgery

## 2023-08-01 ENCOUNTER — Other Ambulatory Visit (HOSPITAL_COMMUNITY): Payer: Self-pay | Admitting: Family Medicine

## 2023-08-01 DIAGNOSIS — F17200 Nicotine dependence, unspecified, uncomplicated: Secondary | ICD-10-CM

## 2023-08-02 ENCOUNTER — Other Ambulatory Visit (HOSPITAL_COMMUNITY): Payer: Self-pay | Admitting: Family Medicine

## 2023-08-02 DIAGNOSIS — Z1231 Encounter for screening mammogram for malignant neoplasm of breast: Secondary | ICD-10-CM

## 2023-08-18 ENCOUNTER — Encounter (HOSPITAL_COMMUNITY): Payer: Self-pay

## 2023-08-18 ENCOUNTER — Ambulatory Visit (HOSPITAL_COMMUNITY): Payer: Medicare HMO

## 2023-08-30 ENCOUNTER — Telehealth (HOSPITAL_COMMUNITY): Payer: Self-pay | Admitting: *Deleted

## 2023-08-30 ENCOUNTER — Telehealth (HOSPITAL_COMMUNITY): Payer: Self-pay | Admitting: Psychiatry

## 2023-08-30 ENCOUNTER — Telehealth (HOSPITAL_COMMUNITY): Payer: Medicare HMO | Admitting: Psychiatry

## 2023-08-30 NOTE — Telephone Encounter (Signed)
Per provider,   patient had moved to [place] when she was about to have an intake appointment with me and didn't want to complete the visit at that time. Then she was supposed to establish care in [place] but unclear that occurred. I hadn't formally established with her back in July and from the information from our initial telephone call would recommend she get established with an in-person provider, which would be at [facility] in [place] as an option.    Provider would like for staff to give her a call to direct her there. Per provider, The other local option would be [facility] in [place]    Staff called patient and her mailbox was full.

## 2023-08-30 NOTE — Telephone Encounter (Signed)
Per Dr Adrian Blackwater I called the patient to give her the phone numbers to [facility] in [place] and [facility] in [place]. The call went straight to voicemail and the voicemail box was full. Patient needs an in person provider.

## 2023-09-20 ENCOUNTER — Ambulatory Visit: Payer: 59 | Admitting: Physician Assistant

## 2023-09-20 ENCOUNTER — Encounter: Payer: Self-pay | Admitting: Physician Assistant

## 2023-09-20 ENCOUNTER — Ambulatory Visit: Payer: Medicare HMO | Admitting: Orthopaedic Surgery

## 2023-09-20 VITALS — BP 139/85 | Ht 62.0 in

## 2023-09-20 DIAGNOSIS — K047 Periapical abscess without sinus: Secondary | ICD-10-CM | POA: Diagnosis not present

## 2023-09-20 DIAGNOSIS — H6690 Otitis media, unspecified, unspecified ear: Secondary | ICD-10-CM

## 2023-09-20 MED ORDER — DOXYCYCLINE HYCLATE 100 MG PO CAPS
100.0000 mg | ORAL_CAPSULE | Freq: Two times a day (BID) | ORAL | 0 refills | Status: DC
Start: 2023-09-20 — End: 2023-11-23

## 2023-09-20 NOTE — Patient Instructions (Signed)
 You are going to take doxycycline  twice a day for the next 10 days.  I encourage you to get plenty of rest and drink lots of fluids.  Make sure that you continue follow-up with dentistry.  I hope that you feel better soon  Kirk CANDIE Sage, PA-C Physician Assistant [facility] Medicine [email or web address]   Dental Abscess  A dental abscess is an infection around a tooth that may involve pain, swelling, and a collection of pus, as well as other symptoms. Treatment is important to help with symptoms and to prevent the infection from spreading. The general types of dental abscesses are: Pulpal abscess. This abscess may form from the inner part of the tooth (pulp). Periodontal abscess. This abscess may form from the gum. What are the causes? This condition is caused by a bacterial infection in or around the tooth. It may result from: Severe tooth decay (cavities). Trauma to the tooth, such as a broken or chipped tooth. What increases the risk? This condition is more likely to develop in males. It is also more likely to develop in people who: Have cavities. Have severe gum disease. Eat sugary snacks between meals. Use tobacco products. Have diabetes. Have a weakened disease-fighting system (immune system). Do not brush and care for their teeth regularly. What are the signs or symptoms? Mild symptoms of this condition include: Tenderness. Bad breath. Fever. A bitter taste in the mouth. Pain in and around the infected tooth. Moderate symptoms of this condition include: Swollen neck glands. Chills. Pus drainage. Swelling and redness around the infected tooth, in the mouth, or in the face. Severe pain in and around the infected tooth. Severe symptoms of this condition include: Difficulty swallowing. Difficulty opening the mouth. Nausea. Vomiting. How is this diagnosed? This condition is diagnosed based on: Your symptoms and your  medical and dental history. An examination of the infected tooth. During the exam, your dental care provider may tap on the infected tooth. You may also need to have X-rays taken of the affected area. How is this treated? This condition is treated by getting rid of the infection. This may be done with: Antibiotic medicines. These may be used in certain situations. Antibacterial mouth rinse. Incision and drainage. This procedure is done by making an incision in the abscess to drain out the pus. Removing pus is the first priority in treating an abscess. A root canal. This may be performed to save the tooth. Your dental care provider accesses the visible part of your tooth (crown) with a drill and removes any infected pulp. Then the space is filled and sealed off. Tooth extraction. The tooth is pulled out if it cannot be saved by other treatment. You may also receive treatment for pain, such as: Acetaminophen  or NSAIDs. Gels that contain a numbing medicine. An injection to block the pain near your nerve. Follow these instructions at home: Medicines Take over-the-counter and prescription medicines only as told by your dental care provider. If you were prescribed an antibiotic, take it as told by your dental care provider. Do not stop taking the antibiotic even if you start to feel better. If you were prescribed a gel that contains a numbing medicine, use it exactly as told in the directions. Do not use these gels for children who are younger than 76 years of age. Use an antibacterial mouth rinse as told by your dental care provider. General instructions  Gargle with a mixture of salt and water 3-4 times a day or as needed.  To make salt water, completely dissolve -1 tsp (3-6 g) of salt in 1 cup (237 mL) of warm water. Eat a soft diet while your abscess is healing. Drink enough fluid to keep your urine pale yellow. Do not apply heat to the outside of your mouth. Do not use any products that  contain nicotine  or tobacco. These products include cigarettes, chewing tobacco, and vaping devices, such as e-cigarettes. If you need help quitting, ask your dental care provider. Keep all follow-up visits. This is important. How is this prevented?  Excellent dental home care, which includes brushing your teeth every morning and night with fluoride toothpaste. Floss one time each day. Get regularly scheduled dental cleanings. Consider having a dental sealant applied on teeth that have deep grooves to prevent cavities. Drink fluoridated water regularly. This includes most tap water. Check the label on bottled water to see if it contains fluoride. Reduce or eliminate sugary drinks. Eat healthy meals and snacks. Wear a mouth guard or face shield to protect your teeth while playing sports. Contact a health care provider if: Your pain is worse and is not helped by medicine. You have swelling. You see pus around the tooth. You have a fever or chills. Get help right away if: Your symptoms suddenly get worse. You have a very bad headache. You have problems breathing or swallowing. You have trouble opening your mouth. You have swelling in your neck or around your eye. These symptoms may represent a serious problem that is an emergency. Do not wait to see if the symptoms will go away. Get medical help right away. Call your local emergency services (911 in the [place] Do not drive yourself to the hospital. Summary A dental abscess is a collection of pus in or around a tooth that results from an infection. A dental abscess may result from severe tooth decay, trauma to the tooth, or severe gum disease around a tooth. Symptoms include severe pain, swelling, redness, and drainage of pus in and around the infected tooth. The first priority in treating a dental abscess is to drain out the pus. Treatment may also involve removing damage inside the tooth (root canal) or extracting the tooth. This  information is not intended to replace advice given to you by your health care provider. Make sure you discuss any questions you have with your health care provider. Document Revised: 11/04/2020 Document Reviewed: 11/04/2020 Elsevier Patient Education  2024 [employer].

## 2023-09-20 NOTE — Progress Notes (Signed)
 New Patient Office Visit  Subjective    Patient ID: Lynn Logan, female    DOB: 1971/02/11  Age: 53 y.o. MRN: [id number]  CC:  Chief Complaint  Patient presents with   Ear Pain    Bilateral ear pain. Patient is prone to ear infections    Dental Pain    Left upper.     HPI SIMRIN VEGH presents with bilateral ear pain and left upper dental pain. The ear pain started two days ago, with no associated ear drainage. The patient has a history of frequent ear infections.  The dental pain, localized to the upper left side, has been ongoing for approximately two weeks. The patient reports associated swelling and a sensation of developing abscess, but no visible drainage. The patient has been managing the pain with warm salt water rinses and mouthwash.   Outpatient Encounter Medications as of 09/20/2023  Medication Sig   albuterol  (PROVENTIL ) (2.5 MG/3ML) 0.083% nebulizer solution Take 3 mLs (2.5 mg total) by nebulization every 6 (six) hours as needed for wheezing or shortness of breath.   ARIPiprazole  (ABILIFY ) 20 MG tablet Take 1 tablet (20 mg total) by mouth daily.   doxycycline  (VIBRAMYCIN ) 100 MG capsule Take 1 capsule (100 mg total) by mouth 2 (two) times daily.   furosemide  (LASIX ) 20 MG tablet Take 20 mg by mouth daily.   lisinopril  (ZESTRIL ) 5 MG tablet Take 1 tablet (5 mg total) by mouth daily.   nitroGLYCERIN  (NITROSTAT ) 0.4 MG SL tablet Place 0.4 mg under the tongue every 5 (five) minutes as needed.   albuterol  (VENTOLIN  HFA) 108 (90 Base) MCG/ACT inhaler Inhale 2 puffs into the lungs every 6 (six) hours as needed for shortness of breath or wheezing. (Patient not taking: Reported on 09/20/2023)   ARIPiprazole  (ABILIFY ) 20 MG tablet Take 1 tablet by mouth daily. (Patient not taking: Reported on 09/20/2023)   estradiol  (ESTRACE ) 2 MG tablet Take 1 tablet (2 mg total) by mouth daily. (Patient not taking: Reported on 09/20/2023)   HYDROcodone -acetaminophen  (NORCO/VICODIN) 5-325 MG  tablet One tablet every six hours for pain.  Limit 7 days. (Patient not taking: Reported on 09/20/2023)   hydrOXYzine  (ATARAX ) 25 MG tablet Take 1 tablet (25 mg total) by mouth every 8 (eight) hours as needed. (Patient not taking: Reported on 09/20/2023)   melatonin 5 MG TABS Take 1 tablet (5 mg total) by mouth at bedtime. (Patient not taking: Reported on 09/20/2023)   naproxen  (NAPROSYN ) 500 MG tablet Take 1 tablet (500 mg total) by mouth 2 (two) times daily with a meal. (Patient not taking: Reported on 09/20/2023)   nicotine  (NICODERM CQ  - DOSED IN MG/24 HOURS) 21 mg/24hr patch Place 1 patch (21 mg total) onto the skin daily at 6 (six) AM. (Patient not taking: Reported on 09/20/2023)   pantoprazole  (PROTONIX ) 40 MG tablet Take 1 tablet (40 mg total) by mouth daily. (Patient not taking: Reported on 09/20/2023)   polyethylene glycol powder (GLYCOLAX /MIRALAX ) powder Take 17 g by mouth 2 (two) times daily as needed. (Patient not taking: Reported on 09/20/2023)   potassium chloride  SA (KLOR-CON ) 20 MEQ tablet Take 1 tablet (20 mEq total) by mouth 2 (two) times daily. (Patient not taking: Reported on 09/20/2023)   RESTASIS 0.05 % ophthalmic emulsion 1 drop 2 (two) times daily. (Patient not taking: Reported on 09/20/2023)   No facility-administered encounter medications on file as of 09/20/2023.    Past Medical History:  Diagnosis Date   Anxiety    Arthritis  Rheumatoid   Asthma    Breast cancer (HCC)    CHF (congestive heart failure) (HCC)    Chronic back pain    Colon cancer (HCC)    COPD (chronic obstructive pulmonary disease) (HCC)    Depression    Fibromyalgia    Gout    Heart attack (HCC) 2017   Renal disorder    Right lumbar radiculopathy    Vitiligo    face    Past Surgical History:  Procedure Laterality Date   CESAREAN SECTION     DILITATION & CURRETTAGE/HYSTROSCOPY WITH NOVASURE ABLATION N/A 07/07/2015   Procedure: HYSTEROSCOPY, UTERINE CURETTAGE, ENDOMETRIAL  ABLATION Uterine Cavity  Length=6.5cm Uterine Cavity Width=4.5cm Power=161 Watts Time=1 minute 19 seconds;  Surgeon: Vonn VEAR Inch, MD;  Location: [facility];  Service: Gynecology;  Laterality: N/A;   POLYPECTOMY  07/07/2015   Procedure: POLYPECTOMY;  Surgeon: Vonn VEAR Inch, MD;  Location: [facility];  Service: Gynecology;;   SALPINGOOPHORECTOMY Bilateral 10/13/2015   Procedure: BILATERAL SALPINGO OOPHORECTOMY;  Surgeon: Vonn VEAR Inch, MD;  Location: [facility];  Service: Gynecology;  Laterality: Bilateral;   TUBAL LIGATION     VAGINAL HYSTERECTOMY N/A 10/13/2015   Procedure: HYSTERECTOMY VAGINAL;  Surgeon: Vonn VEAR Inch, MD;  Location: [facility];  Service: Gynecology;  Laterality: N/A;    Family History  Problem Relation Age of Onset   Diabetes Mother    Congestive Heart Failure Mother    Depression Father    Hypertension Father    Cancer Father    Heart disease Maternal Uncle    Depression Maternal Grandmother     Social History   Socioeconomic History   Marital status: Divorced    Spouse name: Not on file   Number of children: 2   Years of education: 11   Highest education level: 11th grade  Occupational History   Not on file  Tobacco Use   Smoking status: Never    Passive exposure: Never   Smokeless tobacco: Never   Tobacco comments:    Patient denied use of tobacco  Substance and Sexual Activity   Alcohol use: No    Alcohol/week: 0.0 standard drinks of alcohol   Drug use: No    Comment: Hx of marijuana use - None now   Sexual activity: Not Currently    Birth control/protection: Surgical  Other Topics Concern   Not on file  Social History Narrative   Lives with daughter in [place], [place].  Denied use of tobacco, alcohol, all drug use.   Social Drivers of Corporate Investment Banker Strain: Not on file  Food Insecurity: Not on file  Transportation Needs: Not on file  Physical Activity: Not on file  Stress: Not on file  Social Connections: Not on file  Intimate Partner Violence: Not on file     Review of Systems  Constitutional:  Positive for chills. Negative for fever.  HENT:  Positive for ear pain. Negative for congestion, ear discharge, hearing loss, sore throat and tinnitus.   Eyes: Negative.   Respiratory:  Negative for cough and shortness of breath.   Cardiovascular:  Negative for chest pain.  Gastrointestinal: Negative.   Genitourinary: Negative.   Musculoskeletal: Negative.   Skin: Negative.   Neurological: Negative.   Endo/Heme/Allergies: Negative.   Psychiatric/Behavioral: Negative.          Objective    BP 139/85 (BP Location: Left Arm, Patient Position: Sitting, Cuff Size: Large)   Ht 5' 2 (1.575 m)   LMP 09/12/2015 (  Exact Date)   SpO2 98%   BMI 37.49 kg/m   Physical Exam Vitals and nursing note reviewed.  Constitutional:      Appearance: Normal appearance.  HENT:     Head: Normocephalic and atraumatic.     Right Ear: No drainage or tenderness. Tympanic membrane is erythematous and bulging.     Left Ear: No drainage or tenderness. Tympanic membrane is erythematous and bulging.     Nose: Nose normal.     Mouth/Throat:     Mouth: Mucous membranes are moist.     Pharynx: Oropharynx is clear.  Eyes:     Extraocular Movements: Extraocular movements intact.     Conjunctiva/sclera: Conjunctivae normal.     Pupils: Pupils are equal, round, and reactive to light.  Cardiovascular:     Rate and Rhythm: Normal rate and regular rhythm.     Pulses: Normal pulses.     Heart sounds: Normal heart sounds.  Pulmonary:     Effort: Pulmonary effort is normal.     Breath sounds: Normal breath sounds.  Musculoskeletal:        General: Normal range of motion.     Cervical back: Normal range of motion and neck supple.  Skin:    General: Skin is warm and dry.  Neurological:     General: No focal deficit present.     Mental Status: She is alert and oriented to person, place, and time.  Psychiatric:        Mood and Affect: Mood normal.        Behavior:  Behavior normal.        Thought Content: Thought content normal.        Judgment: Judgment normal.         Assessment & Plan:   Problem List Items Addressed This Visit   None Visit Diagnoses       Dental abscess    -  Primary   Relevant Medications   doxycycline  (VIBRAMYCIN ) 100 MG capsule     Acute otitis media, unspecified otitis media type       Relevant Medications   doxycycline  (VIBRAMYCIN ) 100 MG capsule      1. Dental abscess (Primary) Penicillin allergy.  Trial doxycycline .  Patient education given on supportive care.  Red flags given for prompt reevaluation.  Patient encouraged to continue follow-up with dentistry. - doxycycline  (VIBRAMYCIN ) 100 MG capsule; Take 1 capsule (100 mg total) by mouth 2 (two) times daily.  Dispense: 20 capsule; Refill: 0  2. Acute otitis media, unspecified otitis media type Patient will be taking doxycycline  to help with dental abscess.  Patient education given on supportive caret   I have reviewed the patient's medical history (PMH, PSH, Social History, Family History, Medications, and allergies) , and have been updated if relevant. I spent 30 minutes reviewing chart and  face to face time with patient.    Return if symptoms worsen or fail to improve.   Kirk RAMAN Mayers, PA-C

## 2023-10-04 ENCOUNTER — Encounter: Payer: Self-pay | Admitting: Physician Assistant

## 2023-10-04 ENCOUNTER — Ambulatory Visit: Payer: 59 | Admitting: Physician Assistant

## 2023-10-04 ENCOUNTER — Telehealth: Payer: Self-pay

## 2023-10-04 VITALS — BP 146/84 | HR 91 | Wt 205.0 lb

## 2023-10-04 DIAGNOSIS — F332 Major depressive disorder, recurrent severe without psychotic features: Secondary | ICD-10-CM

## 2023-10-04 DIAGNOSIS — N3001 Acute cystitis with hematuria: Secondary | ICD-10-CM | POA: Diagnosis not present

## 2023-10-04 DIAGNOSIS — H60393 Other infective otitis externa, bilateral: Secondary | ICD-10-CM

## 2023-10-04 LAB — POCT URINALYSIS DIP (CLINITEK)
Bilirubin, UA: NEGATIVE
Glucose, UA: NEGATIVE mg/dL
Ketones, POC UA: NEGATIVE mg/dL
Leukocytes, UA: NEGATIVE
Nitrite, UA: NEGATIVE
Spec Grav, UA: 1.01 (ref 1.010–1.025)
Urobilinogen, UA: 0.2 U/dL
pH, UA: 7 (ref 5.0–8.0)

## 2023-10-04 MED ORDER — NITROFURANTOIN MONOHYD MACRO 100 MG PO CAPS
100.0000 mg | ORAL_CAPSULE | Freq: Two times a day (BID) | ORAL | 0 refills | Status: AC
Start: 2023-10-04 — End: 2023-10-09

## 2023-10-04 MED ORDER — OFLOXACIN 0.3 % OT SOLN
5.0000 [drp] | Freq: Every day | OTIC | 0 refills | Status: AC
Start: 2023-10-04 — End: 2023-10-11
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
## 2023-10-04 NOTE — Patient Instructions (Signed)
To help with your ear drainage, you are going to use ofloxacin.  To help with your urinary tract infection, you are going to use Macrobid.  Please let us know if there is any else we can do for you.  Roney Jaffe, PA-C Physician Assistant [facility] [email or web address]   Urinary Tract Infection, Female A urinary tract infection (UTI) is an infection in your urinary tract. The urinary tract is made up of organs that make, store, and get rid of pee (urine) in your body. These organs include: The kidneys. The ureters. The bladder. The urethra. What are the causes? Most UTIs are caused by germs called bacteria. They may be in or near your genitals. These germs grow and cause swelling in your urinary tract. What increases the risk? You're more likely to get a UTI if: You're a female. The urethra is shorter in females than in males. You have a soft tube called a catheter that drains your pee. You can't control when you pee or poop. You have trouble peeing because of: A kidney stone. A urinary blockage. A nerve condition that affects your bladder. Not getting enough to drink. You're sexually active. You use a birth control inside your vagina, like spermicide. You're pregnant. You have low levels of the hormone estrogen in your body. You're an older adult. You're also more likely to get a UTI if you have other health problems. These may include: Diabetes. A weak immune system. Your immune system is your body's defense system. Sickle cell disease. Injury of the spine. What are the signs or symptoms? Symptoms may include: Needing to pee right away. Peeing small amounts often. Pain or burning when you pee. Blood in your pee. Pee that smells bad or odd. Pain in your belly or lower back. You may also: Feel confused. This may be the first symptom in older adults. Vomit. Not feel hungry. Feel tired or easily annoyed. Have  a fever or chills. How is this diagnosed? A UTI is diagnosed based on your medical history and an exam. You may also have other tests. These may include: Pee tests. Blood tests. Tests for sexually transmitted infections (STIs). If you've had more than one UTI, you may need to have imaging studies done to find out why you keep getting them. How is this treated? A UTI can be treated by: Taking antibiotics or other medicines. Drinking enough fluid to keep your pee pale yellow. In rare cases, a UTI can cause a very bad condition called sepsis. Sepsis may be treated in the hospital. Follow these instructions at home: Medicines Take your medicines only as told by your health care provider. If you were given antibiotics, take them as told by your provider. Do not stop taking them even if you start to feel better. General instructions Make sure you: Pee often and fully. Do not hold your pee for a long time. Wipe from front to back after you pee or poop. Use each tissue only once when you wipe. Pee after you have sex. Do not douche or use sprays or powders in your genital area. Contact a health care provider if: Your symptoms don't get better after 1-2 days of taking antibiotics. Your symptoms go away and then come back. You have a fever or chills. You vomit or feel like you may vomit. Get help right away if: You have very bad pain in your back or lower belly. You faint. This information is not intended to replace advice given to  you by your health care provider. Make sure you discuss any questions you have with your health care provider. Document Revised: 04/05/2023 Document Reviewed: 12/01/2022 Elsevier Patient Education  2024 [employer].

## 2023-10-09 ENCOUNTER — Telehealth: Payer: Self-pay
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
## 2023-10-11 ENCOUNTER — Other Ambulatory Visit (HOSPITAL_COMMUNITY): Payer: Self-pay | Admitting: Family Medicine

## 2023-10-11 DIAGNOSIS — R319 Hematuria, unspecified: Secondary | ICD-10-CM

## 2023-10-11 DIAGNOSIS — R911 Solitary pulmonary nodule: Secondary | ICD-10-CM

## 2023-10-27 LAB — GLUCOSE, POCT (MANUAL RESULT ENTRY): Glucose Fasting, POC: 142 mg/dL — AB (ref 70–99)
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
## 2023-11-06 ENCOUNTER — Ambulatory Visit (HOSPITAL_COMMUNITY): Payer: 59

## 2023-11-06 ENCOUNTER — Encounter (HOSPITAL_COMMUNITY): Payer: Self-pay

## 2023-11-08 ENCOUNTER — Ambulatory Visit: Payer: 59 | Admitting: Physician Assistant

## 2023-11-08 ENCOUNTER — Encounter: Payer: Self-pay | Admitting: Physician Assistant

## 2023-11-08 VITALS — BP 142/91 | HR 82 | Ht 62.0 in | Wt 200.0 lb

## 2023-11-08 DIAGNOSIS — H6093 Unspecified otitis externa, bilateral: Secondary | ICD-10-CM | POA: Diagnosis not present

## 2023-11-08 DIAGNOSIS — R718 Other abnormality of red blood cells: Secondary | ICD-10-CM

## 2023-11-08 DIAGNOSIS — R739 Hyperglycemia, unspecified: Secondary | ICD-10-CM

## 2023-11-08 DIAGNOSIS — Z13220 Encounter for screening for lipoid disorders: Secondary | ICD-10-CM

## 2023-11-08 MED ORDER — OFLOXACIN 0.3 % OT SOLN: 5.0000 [drp] | Freq: Every day | OTIC | 0 refills | Status: DC
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
## 2023-11-09 LAB — LIPID PANEL
Chol/HDL Ratio: 3.7 {ratio} (ref 0.0–4.4)
Cholesterol, Total: 191 mg/dL (ref 100–199)
HDL: 51 mg/dL (ref >39)
LDL Chol Calc (NIH): 116 mg/dL — ABNORMAL HIGH (ref 0–99)
Triglycerides: 138 mg/dL (ref 0–149)
VLDL Cholesterol Cal: 24 mg/dL (ref 5–40)

## 2023-11-09 LAB — IRON,TIBC AND FERRITIN PANEL
Ferritin: 21 ng/mL (ref 15–150)
Iron Saturation: 10 % — ABNORMAL LOW (ref 15–55)
Iron: 34 ug/dL (ref 27–159)
Total Iron Binding Capacity: 336 ug/dL (ref 250–450)
UIBC: 302 ug/dL (ref 131–425)

## 2023-11-09 LAB — HEMOGLOBIN A1C

## 2023-11-11 ENCOUNTER — Encounter: Payer: Self-pay | Admitting: Physician Assistant

## 2023-11-13 ENCOUNTER — Ambulatory Visit (HOSPITAL_COMMUNITY): Payer: 59 | Attending: Family Medicine

## 2023-11-16 ENCOUNTER — Encounter: Payer: Self-pay | Admitting: *Deleted

## 2023-11-22 ENCOUNTER — Other Ambulatory Visit: Payer: Self-pay

## 2023-11-22 ENCOUNTER — Emergency Department (HOSPITAL_COMMUNITY)
Admission: EM | Admit: 2023-11-22 | Discharge: 2023-11-23 | Disposition: A | Discharge status: Home / Self Care | Attending: Emergency Medicine | Admitting: Emergency Medicine

## 2023-11-22 ENCOUNTER — Encounter (HOSPITAL_COMMUNITY): Payer: Self-pay

## 2023-11-22 DIAGNOSIS — F419 Anxiety disorder, unspecified: Secondary | ICD-10-CM | POA: Insufficient documentation

## 2023-11-22 DIAGNOSIS — F32A Depression, unspecified: Secondary | ICD-10-CM | POA: Insufficient documentation

## 2023-11-22 DIAGNOSIS — Z0441 Encounter for examination and observation following alleged adult rape: Secondary | ICD-10-CM | POA: Insufficient documentation
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
## 2023-11-23 ENCOUNTER — Other Ambulatory Visit (HOSPITAL_COMMUNITY): Payer: Self-pay

## 2023-11-23 ENCOUNTER — Other Ambulatory Visit: Payer: Self-pay

## 2023-11-23 ENCOUNTER — Ambulatory Visit (HOSPITAL_COMMUNITY)
Admission: EM | Admit: 2023-11-23 | Discharge: 2023-11-23 | Disposition: A | Discharge status: Home / Self Care | Source: Ambulatory Visit | Attending: Emergency Medicine | Admitting: Emergency Medicine

## 2023-11-23 DIAGNOSIS — Z0441 Encounter for examination and observation following alleged adult rape: Secondary | ICD-10-CM | POA: Insufficient documentation

## 2023-11-23 DIAGNOSIS — F419 Anxiety disorder, unspecified: Secondary | ICD-10-CM | POA: Diagnosis not present

## 2023-11-23 LAB — COMPREHENSIVE METABOLIC PANEL
ALT: 21 U/L (ref 0–44)
AST: 16 U/L (ref 15–41)
Albumin: 3.3 g/dL — ABNORMAL LOW (ref 3.5–5.0)
Alkaline Phosphatase: 92 U/L (ref 38–126)
Anion gap: 10 (ref 5–15)
BUN: 17 mg/dL (ref 6–20)
CO2: 23 mmol/L (ref 22–32)
Calcium: 8.6 mg/dL — ABNORMAL LOW (ref 8.9–10.3)
Chloride: 105 mmol/L (ref 98–111)
Creatinine, Ser: 0.84 mg/dL (ref 0.44–1.00)
GFR, Estimated: >60 mL/min (ref >60)
Glucose, Bld: 87 mg/dL (ref 70–99)
Potassium: 3.1 mmol/L — ABNORMAL LOW (ref 3.5–5.1)
Sodium: 138 mmol/L (ref 135–145)
Total Bilirubin: 0.2 mg/dL (ref 0.0–1.2)
Total Protein: 6.2 g/dL — ABNORMAL LOW (ref 6.5–8.1)

## 2023-11-23 LAB — ETHANOL: Alcohol, Ethyl (B): <10 mg/dL (ref <10)

## 2023-11-23 LAB — CBC
HCT: 40 % (ref 36.0–46.0)
Hemoglobin: 12.7 g/dL (ref 12.0–15.0)
MCH: 24.5 pg — ABNORMAL LOW (ref 26.0–34.0)
MCHC: 31.8 g/dL (ref 30.0–36.0)
MCV: 77.2 fL — ABNORMAL LOW (ref 80.0–100.0)
Platelets: 322 10*3/uL (ref 150–400)
RBC: 5.18 MIL/uL — ABNORMAL HIGH (ref 3.87–5.11)
RDW: 15.8 % — ABNORMAL HIGH (ref 11.5–15.5)
WBC: 8.7 10*3/uL (ref 4.0–10.5)
nRBC: 0 % (ref 0.0–0.2)

## 2023-11-23 LAB — RAPID URINE DRUG SCREEN, HOSP PERFORMED
Amphetamines: NOT DETECTED
Barbiturates: NOT DETECTED
Benzodiazepines: POSITIVE — AB
Cocaine: NOT DETECTED
Opiates: NOT DETECTED
Tetrahydrocannabinol: POSITIVE — AB

## 2023-11-23 LAB — RAPID HIV SCREEN (HIV 1/2 AB+AG)
HIV 1/2 Antibodies: NONREACTIVE
HIV-1 P24 Antigen - HIV24: NONREACTIVE

## 2023-11-23 LAB — HEPATITIS B SURFACE ANTIGEN: Hepatitis B Surface Ag: NONREACTIVE

## 2023-11-23 LAB — HEPATITIS C ANTIBODY: HCV Ab: NONREACTIVE

## 2023-11-23 MED ORDER — AZITHROMYCIN 250 MG PO TABS
1000.0000 mg | ORAL_TABLET | Freq: Once | ORAL | Status: AC
  Administered 2023-11-23: 1000 mg via ORAL
  Filled 2023-11-23: qty 4

## 2023-11-23 MED ORDER — ONDANSETRON 4 MG PO TBDP
ORAL_TABLET | ORAL | Status: AC
  Filled 2023-11-23: qty 1

## 2023-11-23 MED ORDER — BICTEGRAVIR-EMTRICITAB-TENOFOV 50-200-25 MG PO PREPACK
1.0000 | ORAL_TABLET | Freq: Every day | ORAL | Status: DC
  Administered 2023-11-23: 1 via ORAL
  Filled 2023-11-23: qty 1

## 2023-11-23 MED ORDER — NICOTINE 21 MG/24HR TD PT24
21.0000 mg | MEDICATED_PATCH | Freq: Every day | TRANSDERMAL | Status: DC
  Administered 2023-11-23: 21 mg via TRANSDERMAL
  Filled 2023-11-23: qty 1

## 2023-11-23 MED ORDER — CEFTRIAXONE SODIUM 500 MG IJ SOLR
500.0000 mg | Freq: Once | INTRAMUSCULAR | Status: AC
  Administered 2023-11-23: 500 mg via INTRAMUSCULAR
  Filled 2023-11-23: qty 500

## 2023-11-23 MED ORDER — FUROSEMIDE 40 MG PO TABS
20.0000 mg | ORAL_TABLET | Freq: Every day | ORAL | Status: DC
Start: 2023-11-23 — End: 2023-11-23
  Administered 2023-11-23: 20 mg via ORAL
  Filled 2023-11-23: qty 1

## 2023-11-23 MED ORDER — ARIPIPRAZOLE 5 MG PO TABS
20.0000 mg | ORAL_TABLET | Freq: Every day | ORAL | Status: DC
  Administered 2023-11-23: 20 mg via ORAL
  Filled 2023-11-23: qty 4

## 2023-11-23 MED ORDER — METRONIDAZOLE 500 MG PO TABS
2000.0000 mg | ORAL_TABLET | Freq: Once | ORAL | Status: AC
  Administered 2023-11-23: 2000 mg via ORAL
  Filled 2023-11-23: qty 4

## 2023-11-23 MED ORDER — LISINOPRIL 5 MG PO TABS
5.0000 mg | ORAL_TABLET | Freq: Every day | ORAL | Status: DC
  Administered 2023-11-23: 5 mg via ORAL
  Filled 2023-11-23: qty 1

## 2023-11-23 MED ORDER — LIDOCAINE HCL (PF) 1 % IJ SOLN
1.0000 mL | Freq: Once | INTRAMUSCULAR | Status: AC
  Administered 2023-11-23: 1.5 mL
  Filled 2023-11-23: qty 5

## 2023-11-23 MED ORDER — ALBUTEROL SULFATE (2.5 MG/3ML) 0.083% IN NEBU: 2.5000 mg | INHALATION_SOLUTION | Freq: Four times a day (QID) | RESPIRATORY_TRACT | Status: DC | PRN

## 2023-11-23 MED ORDER — BICTEGRAVIR-EMTRICITAB-TENOFOV 50-200-25 MG PO TABS
1.0000 | ORAL_TABLET | Freq: Every day | ORAL | 0 refills | Status: AC
Start: 2023-11-23 — End: 2023-12-26
  Filled 2023-11-23 – 2023-11-26 (×3): qty 30, 30d supply, fill #0

## 2023-11-23 NOTE — ED Notes (Signed)
 Per SANE RN, advised to have PO Zofran on hand in case its needed.
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
## 2023-11-23 NOTE — Discharge Instructions (Addendum)
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 are bothersome): Diarrhea Nausea Stomach pain Vomiting  This list may not describe all possible side effects. Call your doctor for medical advice about side effects. You may report side effects to FDA at  1-800-FDA-1088.  Where should I keep my medication? Keep out of the reach of children and pets. Store at room temperature between 15 and 30 degrees C (59 and 86 degrees F). Throw away any unused medication after the expiration date.  NOTE: This sheet is a summary. It may not cover all possible information. If you have questions about this medicine, talk to your doctor, pharmacist, or health care provider.  2024 Elsevier/Gold Standard (2022-05-19 00:00:00)  Metronidazole Capsules or Tablets  What is this medication? METRONIDAZOLE (me troe NI da zole) treats infections caused by bacteria or parasites. It belongs to a group of medications called antibiotics. It will not treat colds, the flu, or  infections caused by viruses. This medicine may be used for other purposes; ask your health care provider or pharmacist if you have questions. COMMON BRAND NAME(S): Flagyl  What should I tell my care team before I take this medication? They need to know if you have any of these conditions: Cockayne syndrome History of blood diseases, such as sickle cell anemia, anemia, or leukemia Frequently drink alcohol Irregular heartbeat or rhythm Kidney disease Liver disease Yeast or fungal infection An unusual or allergic reaction to metronidazole, other medications, foods, dyes, or preservatives Pregnant or trying to get pregnant Breastfeeding  How should I use this medication? Take this medication by mouth with water. Take it as directed on the prescription label at the same time every day. Take all of this medication unless your care team tells you to stop it early. Keep taking it even if you think you are better. Talk to your care team about the use of this medication in children. While it may be prescribed for children for selected conditions, precautions do apply. Overdosage: If you think you have taken too much of this medicine contact a poison control center or emergency room at once. NOTE: This medicine is only for you. Do not share this medicine with others.  What if I miss a dose? If you miss a dose, take it as soon as you can. If it is almost time for your next dose, take only that dose. Do not take double or extra doses.  What may interact with this medication? Do not take this medication with any of the following: Alcohol or any product containing alcohol Cisapride Disulfiram Dronedarone Pimozide Thioridazine This medication may also interact with the following: Busulfan Carbamazepine Certain medications that treat or prevent blood clots, such as warfarin Cimetidine Estrogen or progestin hormones Lithium Other medications that cause heart rhythm  changes Phenobarbital Phenytoin This list may not describe all possible interactions. Give your health care provider a list of all the medicines, herbs, non-prescription drugs, or dietary supplements you use. Also tell them if you smoke, drink alcohol, or use illegal drugs. Some items may interact with your medicine.  What should I watch for while using this medication? Visit your care team for regular checks on your progress. Tell your care team if your symptoms do not start to get better or if they get worse. Some products may contain alcohol. Ask your care team if this medication contains alcohol. Be sure to tell all care teams you are taking this medication. Certain medications, such as metronidazole and disulfiram, can cause an unpleasant reaction when taken with alcohol. The reaction includes flushing, headache, nausea, vomiting, sweating, and increased thirst. The reaction can last from 30 minutes to several hours. If you are being treated for a sexually transmitted infection (STI), avoid sexual contact until you  have finished your treatment. Your partner may also need treatment. Estrogen and progestin hormones may not work as well while you are taking this medication. A barrier contraceptive, such as a condom or diaphragm, is recommended if you are using these hormones for contraception. Talk to your care team about effective forms of contraception.  What side effects may I notice from receiving this medication? Side effects that you should report to your care team as soon as possible: Allergic reactions--skin rash, itching, hives, swelling of the face, lips, tongue, or throat Dizziness, loss of balance or coordination, confusion or trouble speaking Fever, neck pain or stiffness, sensitivity to light, headache, nausea, vomiting, confusion Heart rhythm changes--fast or irregular heartbeat, dizziness, feeling faint or lightheaded, chest pain, trouble breathing Liver injury--right upper belly  pain, loss of appetite, nausea, light-colored stool, dark yellow or brown urine, yellowing skin or eyes, unusual weakness or fatigue Pain, tingling, or numbness in the hands or feet Redness, blistering, peeling, or loosening of the skin, including inside the mouth Seizures Severe diarrhea, fever Sudden eye pain or change in vision such as blurry vision, seeing halos around lights, vision loss Unusual vaginal discharge, itching, or odor Side effects that usually do not require medical attention (report these to your care team if they continue or are bothersome): Diarrhea Metallic taste in mouth Nausea Stomach pain  This list may not describe all possible side effects. Call your doctor for medical advice about side effects. You may report side effects to FDA at 1-800-FDA-1088.  Where should I keep my medication? Keep out of the reach of children and pets. Store between 15 and 25 degrees C (59 and 77 degrees F). Protect from light. Get rid of any unused medication after the expiration date. To get rid of medications that are no longer needed or have expired: Take the medication to a medication take-back program. Check with your pharmacy or law enforcement to find a location. If you cannot return the medication, check the label or package insert to see if the medication should be thrown out in the garbage or flushed down the toilet. If you are not sure, ask your care team. If it is safe to put it in the trash, take the medication out of the container. Mix the medication with cat litter, dirt, coffee grounds, or other unwanted substance. Seal the mixture in a bag or container. Put it in the trash.  NOTE: This sheet is a summary. It may not cover all possible information. If you have questions about this medicine, talk to your doctor, pharmacist, or health care provider.  2024 Elsevier/Gold Standard (2022-09-19 00:00:00)
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
## 2023-11-23 NOTE — ED Notes (Signed)
 Pt care taken, resting waiting for her sane evaluation.
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
## 2023-11-23 NOTE — SANE Note (Signed)
 Pt was able to wake up and sign all consent forms and agree tot he STD prophylaxis medications.   Also blood work was ordered and the procedure explained to patient.

## 2023-11-23 NOTE — ED Notes (Signed)
 Applied nicotine patch to patient and updated VS. Pt stated that she has been out of her bipolar meds and depression meds for at least 2 weeks as there has been a problem with transportation.

## 2023-11-24 LAB — RPR: RPR Ser Ql: NONREACTIVE

## 2023-11-24 LAB — GLUCOSE, POCT (MANUAL RESULT ENTRY): Glucose Fasting, POC: 147 mg/dL — AB (ref 70–99)

## 2023-11-24 LAB — HEMOGLOBIN A1C: Hemoglobin A1C: 6

## 2023-11-24 NOTE — Progress Notes (Signed)
 Pt came to mobile screening event at [facility]. Stated that she does take BP meds and had not taken them this morning. Pt recently has been experiencing very high stress situations. BP 142/85.139/84. Fasting glucose 147. A1c 6.0. Pt has seen Dr in the last month. Recommendations to manage stress, take BP meds as directed, regular screenings. Also manage diet to bring down pre diabetic levels.

## 2023-11-26 ENCOUNTER — Other Ambulatory Visit (HOSPITAL_COMMUNITY): Payer: Self-pay

## 2023-11-26 ENCOUNTER — Other Ambulatory Visit: Payer: Self-pay

## 2023-11-26 LAB — GC/CHLAMYDIA PROBE AMP (~~LOC~~) NOT AT ARMC
Chlamydia: NEGATIVE
Comment: NEGATIVE
Comment: NORMAL
Neisseria Gonorrhea: NEGATIVE

## 2023-11-28 ENCOUNTER — Other Ambulatory Visit: Payer: Self-pay

## 2023-11-28 ENCOUNTER — Emergency Department (HOSPITAL_COMMUNITY)
Admission: EM | Admit: 2023-11-28 | Discharge: 2023-11-28 | Discharge status: Against Medical Advice | Attending: Emergency Medicine | Admitting: Emergency Medicine

## 2023-11-28 ENCOUNTER — Encounter (HOSPITAL_COMMUNITY): Payer: Self-pay | Admitting: Emergency Medicine

## 2023-11-28 ENCOUNTER — Emergency Department (HOSPITAL_COMMUNITY)
Admission: EM | Admit: 2023-11-28 | Discharge: 2023-11-28 | Disposition: A | Discharge status: Home / Self Care | Source: Home / Self Care

## 2023-11-28 DIAGNOSIS — J449 Chronic obstructive pulmonary disease, unspecified: Secondary | ICD-10-CM | POA: Insufficient documentation

## 2023-11-28 DIAGNOSIS — I509 Heart failure, unspecified: Secondary | ICD-10-CM | POA: Diagnosis not present

## 2023-11-28 DIAGNOSIS — Z853 Personal history of malignant neoplasm of breast: Secondary | ICD-10-CM | POA: Insufficient documentation

## 2023-11-28 DIAGNOSIS — J45909 Unspecified asthma, uncomplicated: Secondary | ICD-10-CM | POA: Insufficient documentation

## 2023-11-28 DIAGNOSIS — Z5329 Procedure and treatment not carried out because of patient's decision for other reasons: Secondary | ICD-10-CM | POA: Insufficient documentation

## 2023-11-28 DIAGNOSIS — N939 Abnormal uterine and vaginal bleeding, unspecified: Secondary | ICD-10-CM | POA: Insufficient documentation

## 2023-11-28 DIAGNOSIS — Z85038 Personal history of other malignant neoplasm of large intestine: Secondary | ICD-10-CM | POA: Diagnosis not present

## 2023-11-28 LAB — CBC
HCT: 40.4 % (ref 36.0–46.0)
Hemoglobin: 12.9 g/dL (ref 12.0–15.0)
MCH: 24.5 pg — ABNORMAL LOW (ref 26.0–34.0)
MCHC: 31.9 g/dL (ref 30.0–36.0)
MCV: 76.8 fL — ABNORMAL LOW (ref 80.0–100.0)
Platelets: 309 10*3/uL (ref 150–400)
RBC: 5.26 MIL/uL — ABNORMAL HIGH (ref 3.87–5.11)
RDW: 15.9 % — ABNORMAL HIGH (ref 11.5–15.5)
WBC: 10.2 10*3/uL (ref 4.0–10.5)
nRBC: 0 % (ref 0.0–0.2)

## 2023-11-28 LAB — URINALYSIS, ROUTINE W REFLEX MICROSCOPIC
Bilirubin Urine: NEGATIVE
Glucose, UA: NEGATIVE mg/dL
Ketones, ur: NEGATIVE mg/dL
Nitrite: NEGATIVE
Protein, ur: NEGATIVE mg/dL
Specific Gravity, Urine: 1.009 (ref 1.005–1.030)
pH: 6 (ref 5.0–8.0)

## 2023-11-28 LAB — COMPREHENSIVE METABOLIC PANEL
ALT: 19 U/L (ref 0–44)
AST: 17 U/L (ref 15–41)
Albumin: 3.5 g/dL (ref 3.5–5.0)
Alkaline Phosphatase: 85 U/L (ref 38–126)
Anion gap: 7 (ref 5–15)
BUN: 12 mg/dL (ref 6–20)
CO2: 27 mmol/L (ref 22–32)
Calcium: 8.8 mg/dL — ABNORMAL LOW (ref 8.9–10.3)
Chloride: 104 mmol/L (ref 98–111)
Creatinine, Ser: 0.97 mg/dL (ref 0.44–1.00)
GFR, Estimated: >60 mL/min (ref >60)
Glucose, Bld: 98 mg/dL (ref 70–99)
Potassium: 3.9 mmol/L (ref 3.5–5.1)
Sodium: 138 mmol/L (ref 135–145)
Total Bilirubin: 0.5 mg/dL (ref 0.0–1.2)
Total Protein: 6.6 g/dL (ref 6.5–8.1)

## 2023-11-28 LAB — WET PREP, GENITAL
Clue Cells Wet Prep HPF POC: NONE SEEN
Sperm: NONE SEEN
Trich, Wet Prep: NONE SEEN
WBC, Wet Prep HPF POC: <10 (ref <10)
Yeast Wet Prep HPF POC: NONE SEEN

## 2023-11-28 LAB — PREGNANCY, URINE: Preg Test, Ur: NEGATIVE

## 2023-11-28 LAB — HIV ANTIBODY (ROUTINE TESTING W REFLEX): HIV Screen 4th Generation wRfx: NONREACTIVE
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
## 2023-11-29 LAB — GC/CHLAMYDIA PROBE AMP (~~LOC~~) NOT AT ARMC
Chlamydia: NEGATIVE
Comment: NEGATIVE
Comment: NORMAL
Neisseria Gonorrhea: NEGATIVE

## 2023-11-29 NOTE — ED Provider Notes (Signed)
 ED Progress Note    Received patient in turnover with patient awaiting telepsychiatry evaluation for suicidal ideation and depression  -Patient seen by psychiatry and is recommended for inpatient psychiatric admission for major depressive episode and trauma related acute stress  -Psychiatry also recommended one-on-one level observation with an in person sitter in addition to increasing her Lexapro to 20 mg daily and as determined by the charge nurse calling multiple pharmacies continue Vraylar 1.5 mg daily  -Additionally psychiatry recommends adding a hemoglobin A1c and lipid panel in 2 monitor the patient's QTc interval regularly secondary to her antipsychotics and Lexapro.  The magnesium  and potassium should be monitored recommendations to keep magnesium  greater than 2 and potassium greater than 4  -Upon reevaluation, patient reported still wanting and needing help for depression.  Per sitter and bedside nursing she has been cooperative today and actually spent some time on the phone talking to family.   -Informed by charge nurse that patient has been accepted to [facility] behavioral health facility.  Dr. Dellar moderate in is the accepting physician  -Have completed the EMTALA  -Patient turned over to Dr. Forte awaiting transportation
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
## 2023-12-01 ENCOUNTER — Other Ambulatory Visit (HOSPITAL_COMMUNITY): Payer: Self-pay
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
## 2023-12-12 ENCOUNTER — Ambulatory Visit: Payer: Medicare HMO | Attending: Internal Medicine | Admitting: Internal Medicine

## 2023-12-12 ENCOUNTER — Encounter: Payer: Self-pay | Admitting: Internal Medicine

## 2023-12-12 NOTE — Progress Notes (Signed)
 Erroneous encounter - please disregard.

## 2023-12-27 ENCOUNTER — Encounter: Payer: Self-pay | Admitting: Physician Assistant

## 2023-12-27 ENCOUNTER — Ambulatory Visit: Admitting: Physician Assistant

## 2023-12-27 VITALS — BP 129/63 | HR 76 | Ht 62.0 in | Wt 199.0 lb

## 2023-12-27 DIAGNOSIS — J449 Chronic obstructive pulmonary disease, unspecified: Secondary | ICD-10-CM

## 2023-12-27 DIAGNOSIS — I5032 Chronic diastolic (congestive) heart failure: Secondary | ICD-10-CM

## 2023-12-27 DIAGNOSIS — Z720 Tobacco use: Secondary | ICD-10-CM | POA: Diagnosis not present

## 2023-12-27 DIAGNOSIS — J45909 Unspecified asthma, uncomplicated: Secondary | ICD-10-CM

## 2023-12-27 DIAGNOSIS — R7303 Prediabetes: Secondary | ICD-10-CM

## 2023-12-27 MED ORDER — ALBUTEROL SULFATE HFA 108 (90 BASE) MCG/ACT IN AERS: 2.0000 | INHALATION_SPRAY | Freq: Four times a day (QID) | RESPIRATORY_TRACT | Status: AC | PRN

## 2023-12-27 MED ORDER — NITROGLYCERIN 0.4 MG SL SUBL: 0.4000 mg | SUBLINGUAL_TABLET | SUBLINGUAL | 0 refills | Status: AC | PRN

## 2023-12-27 MED ORDER — LISINOPRIL 5 MG PO TABS: 5.0000 mg | ORAL_TABLET | Freq: Every day | ORAL | 0 refills | Status: AC

## 2023-12-27 MED ORDER — PREDNISONE 10 MG PO TABS: ORAL_TABLET | ORAL | 0 refills | Status: AC

## 2023-12-27 MED ORDER — ALBUTEROL SULFATE (2.5 MG/3ML) 0.083% IN NEBU: 2.5000 mg | INHALATION_SOLUTION | Freq: Four times a day (QID) | RESPIRATORY_TRACT | 1 refills | Status: AC | PRN

## 2023-12-27 MED ORDER — CETIRIZINE HCL 10 MG PO TABS: 10.0000 mg | ORAL_TABLET | Freq: Every day | ORAL | 11 refills | Status: AC

## 2023-12-27 NOTE — Progress Notes (Signed)
 Patient ID: Lynn Logan, female   DOB: February 09, 1971, 53 y.o.   MRN: [id number]   Lynn Logan, is a 53 y.o. female  UYQ:[id number]  GLO:[id number]  DOB - 01-17-1971  Chief Complaint  Patient presents with   Chest Pain    Wheezing, feeling of tightness. Patient has been without inhaler x2 days    Medication Refill    Patient needs refill for Allergy meds and inhauler.        Subjective:   Lynn Logan is a 53 y.o. female here today for copd and wheezing.  She has been a frequent patient and ED and UC and does not go and pick up prescriptions.  She says she has been wheezing a lot more over the last few weeks since trees and pollen started being active.  She denies any CP currently but does admit to having it on occasion and wants a RF of NG.  She says she has an appt with cardiology next [facility] cardiology in [place].  She has a cell phone.  She has taken prednisone in the past without any problem.    No problems updated.  ALLERGIES: Allergies  Allergen Reactions   Toradol [Ketorolac Tromethamine] Itching   Buspirone Nausea And Vomiting   Lyrica [Pregabalin] Nausea And Vomiting   Gabapentin Nausea And Vomiting   Tramadol Nausea And Vomiting   Zofran [Ondansetron Hcl] Nausea And Vomiting   Penicillins Rash    Has patient had a PCN reaction causing immediate rash, facial/tongue/throat swelling, SOB or lightheadedness with hypotension: No Has patient had a PCN reaction causing severe rash involving mucus membranes or skin necrosis: No Has patient had a PCN reaction that required hospitalization No Has patient had a PCN reaction occurring within the last 10 years: yes If all of the above answers are "NO", then may proceed with Cephalosporin use.     PAST MEDICAL HISTORY: Past Medical History:  Diagnosis Date   Anxiety    Arthritis    Rheumatoid   Asthma    Breast cancer (HCC)    CHF (congestive heart failure) (HCC)    Chronic back pain    Colon cancer (HCC)     COPD (chronic obstructive pulmonary disease) (HCC)    Depression    Fibromyalgia    Gout    Heart attack (HCC) 2017   Renal disorder    Right lumbar radiculopathy    Vitiligo    face    MEDICATIONS AT HOME: Prior to Admission medications   Medication Sig Start Date End Date Taking? Authorizing Provider  ARIPiprazole (ABILIFY) 20 MG tablet Take 1 tablet (20 mg total) by mouth daily. 08/04/21  Yes Sharol Decamp, NP  cetirizine (ZYRTEC) 10 MG tablet Take 1 tablet (10 mg total) by mouth daily. 12/27/23  Yes Dulce Gibbs M, PA-C  furosemide (LASIX) 20 MG tablet Take 20 mg by mouth daily. 07/11/21  Yes [provider]  nicotine (NICODERM CQ - DOSED IN MG/24 HOURS) 21 mg/24hr patch Place 1 patch (21 mg total) onto the skin daily at 6 (six) AM. 08/04/21  Yes Sharol Decamp, NP  predniSONE (DELTASONE) 10 MG tablet 6,5,4,3,2,1 12/27/23  Yes Yanelie Abraha M, PA-C  albuterol (PROVENTIL) (2.5 MG/3ML) 0.083% nebulizer solution Take 3 mLs (2.5 mg total) by nebulization every 6 (six) hours as needed for wheezing or shortness of breath. 12/27/23   Hassie Lint, PA-C  albuterol (VENTOLIN HFA) 108 (90 Base) MCG/ACT inhaler Inhale 2 puffs into the lungs every 6 (six)  hours as needed for shortness of breath or wheezing. 12/27/23   Anders Simmonds, PA-C  estradiol (ESTRACE) 2 MG tablet Take 1 tablet (2 mg total) by mouth daily. Patient not taking: Reported on 09/20/2023 01/30/17   Lazaro Arms, MD  lisinopril (ZESTRIL) 5 MG tablet Take 1 tablet (5 mg total) by mouth daily. 12/27/23   Anders Simmonds, PA-C  nitroGLYCERIN (NITROSTAT) 0.4 MG SL tablet Place 1 tablet (0.4 mg total) under the tongue every 5 (five) minutes as needed. 12/27/23   Genetta Fiero, Marzella Schlein, PA-C    ROS: Neg HEENT Neg cardiac Neg GI Neg GU Neg MS Neg psych Neg neuro  Objective:   Vitals:   12/27/23 1019  BP: 129/63  Pulse: 76  SpO2: 93%  Weight: 199 lb (90.3 kg)  Height: 5\' 2"  (1.575 m)    Exam General appearance : Awake, alert, not in any distress. Speech Clear. Not toxic looking HEENT: Atraumatic and Normocephalic, poor dentition Neck: Supple, no JVD. No cervical lymphadenopathy.  Chest: Fair air entry bilaterally.  Breathing is non-labored.   No rales/rhonchi.  There is mild wheezing throughout CVS: S1 S2 regular, no murmurs.  Extremities: B/L Lower Ext shows minimal edema, both legs are warm to touch Neurology: Awake alert, and oriented X 3, CN II-XII intact, Non focal Skin: No Rash  Data Review Lab Results  Component Value Date   HGBA1C 6.0 11/24/2023   HGBA1C CANCELED 11/08/2023   HGBA1C 5.4 07/27/2021    Assessment & Plan   1. Reactive airway disease, unspecified asthma severity, uncomplicated  - albuterol (PROVENTIL) (2.5 MG/3ML) 0.083% nebulizer solution; Take 3 mLs (2.5 mg total) by nebulization every 6 (six) hours as needed for wheezing or shortness of breath.  Dispense: 150 mL; Refill: 1 - albuterol (VENTOLIN HFA) 108 (90 Base) MCG/ACT inhaler; Inhale 2 puffs into the lungs every 6 (six) hours as needed for shortness of breath or wheezing. - predniSONE (DELTASONE) 10 MG tablet; 6,5,4,3,2,1  Dispense: 21 tablet; Refill: 0  2. Chronic diastolic congestive heart failure (HCC) (Primary) Call 911 if any CP - nitroGLYCERIN (NITROSTAT) 0.4 MG SL tablet; Place 1 tablet (0.4 mg total) under the tongue every 5 (five) minutes as needed.  Dispense: 7 tablet; Refill: 0 - lisinopril (ZESTRIL) 5 MG tablet; Take 1 tablet (5 mg total) by mouth daily.  Dispense: 30 tablet; Refill: 0 - predniSONE (DELTASONE) 10 MG tablet; 6,5,4,3,2,1  Dispense: 21 tablet; Refill: 0  3. Chronic obstructive pulmonary disease, unspecified COPD type (HCC) See #2  4. Tobacco user Smoking and dangers of nicotine have been discussed at length. Long term health consequences of smoking reviewed in detail.  Methods for helping with cessation have been reviewed.  Patient expresses  understanding.   5. Prediabetes Work at a goal of eliminating sugary drinks, candy, desserts, sweets, refined sugars, processed foods, and white carbohydrates.       Return if symptoms worsen or fail to improve, for PCP for chronic conditions.  The patient was given clear instructions to go to ER or return to medical center if symptoms don't improve, worsen or new problems develop. The patient verbalized understanding. The patient was told to call to get lab results if they haven't heard anything in the next week.      Georgian Co, PA-C [facility] [place], [place] [phone]   12/27/2023, 10:34 AM

## 2024-01-21 ENCOUNTER — Other Ambulatory Visit: Payer: Self-pay | Admitting: Physician Assistant

## 2024-01-21 DIAGNOSIS — I5032 Chronic diastolic (congestive) heart failure: Secondary | ICD-10-CM

## 2024-01-22 NOTE — Telephone Encounter (Signed)
 Prescribed at a mobile unit- 08/29/23 #90 4 RF  Requested Prescriptions  Refused Prescriptions Disp Refills   lisinopril  (ZESTRIL ) 5 MG tablet [Pharmacy Med Name: LISINOPRIL  5 MG TAB] 30 tablet 0    Sig: TAKE ONE (1) TABLET BY MOUTH EVERY DAY     There is no refill protocol information for this order

## 2024-03-21 ENCOUNTER — Encounter (HOSPITAL_COMMUNITY): Payer: Self-pay

## 2024-03-21 ENCOUNTER — Emergency Department (HOSPITAL_COMMUNITY)
Admission: EM | Admit: 2024-03-21 | Discharge: 2024-03-22 | Disposition: A | Discharge status: Home / Self Care | Attending: Emergency Medicine | Admitting: Emergency Medicine

## 2024-03-21 ENCOUNTER — Other Ambulatory Visit: Payer: Self-pay

## 2024-03-21 DIAGNOSIS — F32A Depression, unspecified: Secondary | ICD-10-CM | POA: Insufficient documentation

## 2024-03-21 DIAGNOSIS — Z79899 Other long term (current) drug therapy: Secondary | ICD-10-CM | POA: Diagnosis not present

## 2024-03-21 DIAGNOSIS — I509 Heart failure, unspecified: Secondary | ICD-10-CM | POA: Diagnosis not present

## 2024-03-21 DIAGNOSIS — R45851 Suicidal ideations: Secondary | ICD-10-CM | POA: Diagnosis not present

## 2024-03-21 DIAGNOSIS — J449 Chronic obstructive pulmonary disease, unspecified: Secondary | ICD-10-CM | POA: Insufficient documentation

## 2024-03-21 HISTORY — DX: Bipolar disorder, unspecified: F31.9

## 2024-03-21 HISTORY — DX: Post-traumatic stress disorder, unspecified: F43.10

## 2024-03-21 LAB — RAPID URINE DRUG SCREEN, HOSP PERFORMED
Amphetamines: NOT DETECTED
Barbiturates: NOT DETECTED
Benzodiazepines: NOT DETECTED
Cocaine: NOT DETECTED
Opiates: NOT DETECTED
Tetrahydrocannabinol: NOT DETECTED

## 2024-03-21 LAB — COMPREHENSIVE METABOLIC PANEL WITH GFR
ALT: 43 U/L (ref 0–44)
AST: 32 U/L (ref 15–41)
Albumin: 3.9 g/dL (ref 3.5–5.0)
Alkaline Phosphatase: 96 U/L (ref 38–126)
Anion gap: 15 (ref 5–15)
BUN: 12 mg/dL (ref 6–20)
CO2: 22 mmol/L (ref 22–32)
Calcium: 9.4 mg/dL (ref 8.9–10.3)
Chloride: 109 mmol/L (ref 98–111)
Creatinine, Ser: 0.95 mg/dL (ref 0.44–1.00)
GFR, Estimated: >60 mL/min (ref >60)
Glucose, Bld: 110 mg/dL — ABNORMAL HIGH (ref 70–99)
Potassium: 3.8 mmol/L (ref 3.5–5.1)
Sodium: 146 mmol/L — ABNORMAL HIGH (ref 135–145)
Total Bilirubin: 0.8 mg/dL (ref 0.0–1.2)
Total Protein: 7.3 g/dL (ref 6.5–8.1)

## 2024-03-21 LAB — CBC
HCT: 47.2 % — ABNORMAL HIGH (ref 36.0–46.0)
Hemoglobin: 15.7 g/dL — ABNORMAL HIGH (ref 12.0–15.0)
MCH: 25.8 pg — ABNORMAL LOW (ref 26.0–34.0)
MCHC: 33.3 g/dL (ref 30.0–36.0)
MCV: 77.5 fL — ABNORMAL LOW (ref 80.0–100.0)
Platelets: 298 K/uL (ref 150–400)
RBC: 6.09 MIL/uL — ABNORMAL HIGH (ref 3.87–5.11)
RDW: 15.7 % — ABNORMAL HIGH (ref 11.5–15.5)
WBC: 7.3 K/uL (ref 4.0–10.5)
nRBC: 0 % (ref 0.0–0.2)

## 2024-03-21 LAB — ETHANOL: Alcohol, Ethyl (B): <15 mg/dL (ref <15)

## 2024-03-21 LAB — SALICYLATE LEVEL: Salicylate Lvl: <7 mg/dL — ABNORMAL LOW (ref 7.0–30.0)

## 2024-03-21 LAB — ACETAMINOPHEN LEVEL: Acetaminophen (Tylenol), Serum: <10 ug/mL — ABNORMAL LOW (ref 10–30)

## 2024-03-21 NOTE — ED Provider Notes (Signed)
 [place] EMERGENCY DEPARTMENT AT [facility] Provider Note   CSN: [id number] Arrival date & time: 03/21/24  1546     Patient presents with: Psychiatric Evaluation   Lynn Logan is a 53 y.o. female with a history including CHF, COPD, bipolar disorder, depression, PTSD and history of MI presenting for evaluation of worsening depression.  She was seen by her primary provider who started her on a new depression medication which she thinks is BuSpar but not 100% certain 3 weeks ago and has noted feeling tremulous since being on this medication stating she wakes up in the morning with a tremor sometimes at last all day sometimes it resolves shortly after waking.  She got scared tonight when she started thinking about killing herself, specifically thought about cutting herself with a knife.  She states these thoughts only lasted a few minutes but it scared her enough to come in tonight.  She currently denies SI or HI.  She also denies auditory or visual hallucinations.     The history is provided by the patient.       Prior to Admission medications   Medication Sig Start Date End Date Taking? Authorizing Provider  albuterol  (PROVENTIL ) (2.5 MG/3ML) 0.083% nebulizer solution Take 3 mLs (2.5 mg total) by nebulization every 6 (six) hours as needed for wheezing or shortness of breath. 12/27/23   Danton Jon HERO, PA-C  albuterol  (VENTOLIN  HFA) 108 (90 Base) MCG/ACT inhaler Inhale 2 puffs into the lungs every 6 (six) hours as needed for shortness of breath or wheezing. 12/27/23   Danton Jon HERO, PA-C  ARIPiprazole  (ABILIFY ) 20 MG tablet Take 1 tablet (20 mg total) by mouth daily. 08/04/21   Janifer Mitzie Retort, NP  cetirizine  (ZYRTEC ) 10 MG tablet Take 1 tablet (10 mg total) by mouth daily. 12/27/23   Danton Jon HERO, PA-C  estradiol  (ESTRACE ) 2 MG tablet Take 1 tablet (2 mg total) by mouth daily. Patient not taking: Reported on 09/20/2023 01/30/17   Jayne Vonn DEL, MD  furosemide   (LASIX ) 20 MG tablet Take 20 mg by mouth daily. 07/11/21   [provider]  lisinopril  (ZESTRIL ) 5 MG tablet Take 1 tablet (5 mg total) by mouth daily. 12/27/23   Danton Jon HERO, PA-C  nicotine  (NICODERM CQ  - DOSED IN MG/24 HOURS) 21 mg/24hr patch Place 1 patch (21 mg total) onto the skin daily at 6 (six) AM. 08/04/21   Janifer Mitzie Retort, NP  nitroGLYCERIN  (NITROSTAT ) 0.4 MG SL tablet Place 1 tablet (0.4 mg total) under the tongue every 5 (five) minutes as needed. 12/27/23   Danton Jon HERO, PA-C  predniSONE  (DELTASONE ) 10 MG tablet 6,5,4,3,2,1 12/27/23   Danton Jon HERO, PA-C    Allergies: Toradol  [ketorolac  tromethamine ], Buspirone, Lyrica  [pregabalin ], Gabapentin , Tramadol , Zofran  [ondansetron  hcl], and Penicillins    Review of Systems  Constitutional:  Negative for fever.  HENT:  Negative for congestion and sore throat.   Eyes: Negative.   Respiratory:  Negative for chest tightness and shortness of breath.   Cardiovascular:  Negative for chest pain.  Gastrointestinal:  Negative for abdominal pain and nausea.  Genitourinary: Negative.   Musculoskeletal:  Negative for arthralgias, joint swelling and neck pain.  Skin: Negative.  Negative for rash and wound.  Neurological:  Negative for dizziness, weakness, light-headedness, numbness and headaches.  Psychiatric/Behavioral:  Positive for suicidal ideas.     Updated Vital Signs BP 121/80   Pulse 64   Temp 98.2 F (36.8 C) (Oral)   Resp  18   Ht 5' 2 (1.575 m)   Wt 90.3 kg   LMP 09/12/2015 (Exact Date)   SpO2 96%   BMI 36.40 kg/m   Physical Exam Vitals and nursing note reviewed.  Constitutional:      Appearance: She is well-developed.  HENT:     Head: Normocephalic and atraumatic.  Eyes:     Conjunctiva/sclera: Conjunctivae normal.  Cardiovascular:     Rate and Rhythm: Normal rate and regular rhythm.     Heart sounds: Normal heart sounds.  Pulmonary:     Effort: Pulmonary effort is normal.     Breath  sounds: Normal breath sounds. No wheezing.  Abdominal:     General: Bowel sounds are normal.     Palpations: Abdomen is soft.     Tenderness: There is no abdominal tenderness.  Musculoskeletal:        General: Normal range of motion.     Cervical back: Normal range of motion.  Skin:    General: Skin is warm and dry.  Neurological:     General: No focal deficit present.     Mental Status: She is alert.  Psychiatric:        Attention and Perception: Attention normal.        Mood and Affect: Mood is depressed. Affect is flat.        Speech: Speech normal.        Behavior: Behavior is cooperative.        Thought Content: Thought content includes suicidal ideation. Thought content includes suicidal plan.     (all labs ordered are listed, but only abnormal results are displayed) Labs Reviewed  COMPREHENSIVE METABOLIC PANEL WITH GFR - Abnormal; Notable for the following components:      Result Value   Sodium 146 (*)    Glucose, Bld 110 (*)    All other components within normal limits  CBC - Abnormal; Notable for the following components:   RBC 6.09 (*)    Hemoglobin 15.7 (*)    HCT 47.2 (*)    MCV 77.5 (*)    MCH 25.8 (*)    RDW 15.7 (*)    All other components within normal limits  SALICYLATE LEVEL - Abnormal; Notable for the following components:   Salicylate Lvl <7.0 (*)    All other components within normal limits  ACETAMINOPHEN  LEVEL - Abnormal; Notable for the following components:   Acetaminophen  (Tylenol ), Serum <10 (*)    All other components within normal limits  ETHANOL  RAPID URINE DRUG SCREEN, HOSP PERFORMED    EKG: None  Radiology: No results found.   Procedures   Medications Ordered in the ED - No data to display                                  Medical Decision Making Patient is medically cleared from the ED perspective.  She has had transient suicidal thoughts this evening and has recently been started on BuSpar for depression, she is concerned  this medication is causing these thoughts.  However she does endorse these thoughts were very transient and she no longer feels suicidal.  She was agreeable to be evaluated by our TTS providers was completed.  Evaluated by Roxianne Olp NP who cleared her, stable for follow-up with her PCP on Monday.  I am comfortable with this decision.  Amount and/or Complexity of Data Reviewed Labs: ordered.  Details: Labs reviewed including drug screen which is negative, ethanol level salicylate and acetaminophen  levels are all negative, c-Met has a borderline sodium at 146, also borderline elevated hemoglobin at 15.7        Final diagnoses:  Depression, unspecified depression type    ED Discharge Orders     None          Birdena Mliss RIGGERS 03/22/24 0107    Franklyn Sid SAILOR, MD 03/22/24 [phone]

## 2024-03-21 NOTE — ED Triage Notes (Signed)
 Reports was started on a new medication 3 weeks ago and has had thoughts of hurting herself.  She can't remember what medication it is but wants her medications switched.  Denies a/v hallucinations.

## 2024-03-21 NOTE — ED Notes (Signed)
 Patient resting in room, alert and oriented x 3. No complaints of pain or discomfort. Tolerating po fluids. Patient at present denies any thoughts of SI. Will continue to monitor.

## 2024-03-21 NOTE — ED Provider Notes (Incomplete)
 [place] EMERGENCY DEPARTMENT AT [facility] Provider Note   CSN: [id number] Arrival date & time: 03/21/24  1546     Patient presents with: Psychiatric Evaluation   Lynn Logan is a 53 y.o. female with a history including CHF, COPD, bipolar disorder, depression, PTSD and history of MI presenting for evaluation of worsening depression.  She was seen by her primary provider who started her on a new depression medication which she thinks is BuSpar but not 100% certain 3 weeks ago and has noted feeling tremulous since being on this medication stating she wakes up in the morning with a tremor sometimes at last all day sometimes it resolves shortly after waking.  She got scared tonight when she started thinking about killing herself, specifically thought about cutting herself with a knife.  She states these thoughts only lasted a few minutes but it scared her enough to come in tonight.  She currently denies SI or HI.  She also denies auditory or visual hallucinations.    {Add pertinent medical, surgical, social history, OB history to YEP:67052} The history is provided by the patient.       Prior to Admission medications   Medication Sig Start Date End Date Taking? Authorizing Provider  albuterol  (PROVENTIL ) (2.5 MG/3ML) 0.083% nebulizer solution Take 3 mLs (2.5 mg total) by nebulization every 6 (six) hours as needed for wheezing or shortness of breath. 12/27/23   Danton Jon HERO, PA-C  albuterol  (VENTOLIN  HFA) 108 (90 Base) MCG/ACT inhaler Inhale 2 puffs into the lungs every 6 (six) hours as needed for shortness of breath or wheezing. 12/27/23   Danton Jon HERO, PA-C  ARIPiprazole  (ABILIFY ) 20 MG tablet Take 1 tablet (20 mg total) by mouth daily. 08/04/21   Janifer Mitzie Retort, NP  cetirizine  (ZYRTEC ) 10 MG tablet Take 1 tablet (10 mg total) by mouth daily. 12/27/23   Danton Jon HERO, PA-C  estradiol  (ESTRACE ) 2 MG tablet Take 1 tablet (2 mg total) by mouth daily. Patient  not taking: Reported on 09/20/2023 01/30/17   Jayne Vonn DEL, MD  furosemide  (LASIX ) 20 MG tablet Take 20 mg by mouth daily. 07/11/21   [provider]  lisinopril  (ZESTRIL ) 5 MG tablet Take 1 tablet (5 mg total) by mouth daily. 12/27/23   Danton Jon HERO, PA-C  nicotine  (NICODERM CQ  - DOSED IN MG/24 HOURS) 21 mg/24hr patch Place 1 patch (21 mg total) onto the skin daily at 6 (six) AM. 08/04/21   Janifer Mitzie Retort, NP  nitroGLYCERIN  (NITROSTAT ) 0.4 MG SL tablet Place 1 tablet (0.4 mg total) under the tongue every 5 (five) minutes as needed. 12/27/23   Danton Jon HERO, PA-C  predniSONE  (DELTASONE ) 10 MG tablet 6,5,4,3,2,1 12/27/23   Danton Jon HERO, PA-C    Allergies: Toradol  [ketorolac  tromethamine ], Buspirone, Lyrica  [pregabalin ], Gabapentin , Tramadol , Zofran  [ondansetron  hcl], and Penicillins    Review of Systems  Constitutional:  Negative for fever.  HENT:  Negative for congestion and sore throat.   Eyes: Negative.   Respiratory:  Negative for chest tightness and shortness of breath.   Cardiovascular:  Negative for chest pain.  Gastrointestinal:  Negative for abdominal pain and nausea.  Genitourinary: Negative.   Musculoskeletal:  Negative for arthralgias, joint swelling and neck pain.  Skin: Negative.  Negative for rash and wound.  Neurological:  Negative for dizziness, weakness, light-headedness, numbness and headaches.  Psychiatric/Behavioral:  Positive for suicidal ideas.     Updated Vital Signs BP 121/80   Pulse 64  Temp 98.2 F (36.8 C) (Oral)   Resp 18   Ht 5' 2 (1.575 m)   Wt 90.3 kg   LMP 09/12/2015 (Exact Date)   SpO2 96%   BMI 36.40 kg/m   Physical Exam Vitals and nursing note reviewed.  Constitutional:      Appearance: She is well-developed.  HENT:     Head: Normocephalic and atraumatic.  Eyes:     Conjunctiva/sclera: Conjunctivae normal.  Cardiovascular:     Rate and Rhythm: Normal rate and regular rhythm.     Heart sounds: Normal heart  sounds.  Pulmonary:     Effort: Pulmonary effort is normal.     Breath sounds: Normal breath sounds. No wheezing.  Abdominal:     General: Bowel sounds are normal.     Palpations: Abdomen is soft.     Tenderness: There is no abdominal tenderness.  Musculoskeletal:        General: Normal range of motion.     Cervical back: Normal range of motion.  Skin:    General: Skin is warm and dry.  Neurological:     General: No focal deficit present.     Mental Status: She is alert.  Psychiatric:        Attention and Perception: Attention normal.        Mood and Affect: Mood is depressed. Affect is flat.        Speech: Speech normal.        Behavior: Behavior is cooperative.        Thought Content: Thought content includes suicidal ideation. Thought content includes suicidal plan.     (all labs ordered are listed, but only abnormal results are displayed) Labs Reviewed  COMPREHENSIVE METABOLIC PANEL WITH GFR - Abnormal; Notable for the following components:      Result Value   Sodium 146 (*)    Glucose, Bld 110 (*)    All other components within normal limits  CBC - Abnormal; Notable for the following components:   RBC 6.09 (*)    Hemoglobin 15.7 (*)    HCT 47.2 (*)    MCV 77.5 (*)    MCH 25.8 (*)    RDW 15.7 (*)    All other components within normal limits  SALICYLATE LEVEL - Abnormal; Notable for the following components:   Salicylate Lvl <7.0 (*)    All other components within normal limits  ACETAMINOPHEN  LEVEL - Abnormal; Notable for the following components:   Acetaminophen  (Tylenol ), Serum <10 (*)    All other components within normal limits  ETHANOL  RAPID URINE DRUG SCREEN, HOSP PERFORMED    EKG: None  Radiology: No results found.  {Document cardiac monitor, telemetry assessment procedure when appropriate:32947} Procedures   Medications Ordered in the ED - No data to display    {Click here for ABCD2, HEART and other calculators REFRESH Note before signing:1}                               Medical Decision Making Amount and/or Complexity of Data Reviewed Labs: ordered.   ***  {Document critical care time when appropriate  Document review of labs and clinical decision tools ie CHADS2VASC2, etc  Document your independent review of radiology images and any outside records  Document your discussion with family members, caretakers and with consultants  Document social determinants of health affecting pt's care  Document your decision making why or why not admission, treatments  were needed:32947:::1}   Final diagnoses:  None    ED Discharge Orders     None

## 2024-03-22 DIAGNOSIS — F32A Depression, unspecified: Secondary | ICD-10-CM | POA: Diagnosis not present

## 2024-03-22 NOTE — BH Assessment (Signed)
 Comprehensive Clinical Assessment (CCA) Note   03/22/2024 Lynn Logan [id number]   Disposition: Per Roxianne Olp, NP, pt is psych cleared and is to follow up with PCP regarding medications .  The patient demonstrates the following risk factors for suicide: Chronic risk factors for suicide include: previous suicide attempts  . Acute risk factors for suicide include: social withdrawal/isolation. Protective factors for this patient include: positive social support. Considering these factors, the overall suicide risk at this point appears to be low. Patient is not appropriate for outpatient follow up.    Per EDP's note:  is a 53 y.o. female with a history including CHF, COPD, bipolar disorder, depression, PTSD and history of MI presenting for evaluation of worsening depression.  She was seen by her primary provider who started her on a new depression medication which she thinks is BuSpar but not 100% certain 3 weeks ago and has noted feeling tremulous since being on this medication stating she wakes up in the morning with a tremor sometimes at last all day sometimes it resolves shortly after waking.  She got scared tonight when she started thinking about killing herself, specifically thought about cutting herself with a knife.  She states these thoughts only lasted a few minutes but it scared her enough to come in tonight.  She currently denies SI or HI.  She also denies auditory or visual hallucinations.    Upon evaluation with this clinician, the patient is alert, oriented x 3, and cooperative. Speech is clear, coherent and logical. Pt appears casual. Eye contact is fair. Mood is anxious and depressed; affect is congruent with mood. The thought process is logical and thought content is coherent.  Pt denies SI/HI/AVH. Pt reports that she is concerned about her medications. Pt reports that she would like her medications changed. There is no indication that the patient is responding to internal  stimuli. No delusions elicited during this assessment.      Chief Complaint:  Chief Complaint  Patient presents with   Psychiatric Evaluation   Visit Diagnosis: Bipolar     CCA Screening, Triage and Referral (STR)  Patient Reported Information How did you hear about us ? -- ([facility])  What Is the Reason for Your Visit/Call Today? Per EDP's note:  is a 53 y.o. female with a history including CHF, COPD, bipolar disorder, depression, PTSD and history of MI presenting for evaluation of worsening depression.  She was seen by her primary provider who started her on a new depression medication which she thinks is BuSpar but not 100% certain 3 weeks ago and has noted feeling tremulous since being on this medication stating she wakes up in the morning with a tremor sometimes at last all day sometimes it resolves shortly after waking.  She got scared tonight when she started thinking about killing herself, specifically thought about cutting herself with a knife.  She states these thoughts only lasted a few minutes but it scared her enough to come in tonight.  She currently denies SI or HI.  She also denies auditory or visual hallucinations.    How Long Has This Been Causing You Problems? <Week  What Do You Feel Would Help You the Most Today? Treatment for Depression or other mood problem; Medication(s)   Have You Recently Had Any Thoughts About Hurting Yourself? No  Are You Planning to Commit Suicide/Harm Yourself At This time? No   Flowsheet Row ED from 03/21/2024 in [facility] Emergency Department at [facility] ED from 11/28/2023  in [facility] Emergency Department at [facility] ED from 11/22/2023 in [facility] Emergency Department at [facility]  C-SSRS RISK CATEGORY Moderate Risk Error: Q3, 4, or 5 should not be populated when Q2 is No High Risk    Have you Recently Had Thoughts About Hurting Someone [place]? No  Are You Planning to Harm Someone at This Time?  No  Explanation: Denies   Have You Used Any Alcohol or Drugs in the Past 24 Hours? No  How Long Ago Did You Use Drugs or Alcohol? N/A What Did You Use and How Much? N/A  Do You Currently Have a Therapist/Psychiatrist? No  Name of Therapist/Psychiatrist:    Have You Been Recently Discharged From Any Office Practice or Programs? No  Explanation of Discharge From Practice/Program: n/a    CCA Screening Triage Referral Assessment Type of Contact: Tele-Assessment  Telemedicine Service Delivery: Telemedicine service delivery: This service was provided via telemedicine using a 2-way, interactive audio and video technology  Is this Initial or Reassessment? Is this Initial or Reassessment?: Initial Assessment  Date Telepsych consult ordered in [facility]:  Date Telepsych consult ordered in [facility]: 03/21/24  Time Telepsych consult ordered in [facility]:  Time Telepsych consult ordered in [facility]: 2302  Location of Assessment: [facility]  Provider Location: [facility] Assessment Services   Collateral Involvement: None currently   Does Patient Have a Automotive engineer Guardian? No  Legal Guardian Contact Information: n/a  Copy of Legal Guardianship Form: -- (n/a)  Legal Guardian Notified of Arrival: -- (n/a)  Legal Guardian Notified of Pending Discharge: -- (n/a)  If Minor and Not Living with Parent(s), Who has Custody? n/a  Is CPS involved or ever been involved? Never  Is APS involved or ever been involved? Never   Patient Determined To Be At Risk for Harm To Self or Others Based on Review of Patient Reported Information or Presenting Complaint? No  Method: No Plan  Availability of Means: No access or NA  Intent: Vague intent or NA  Notification Required: No need or identified person  Additional Information for Danger to Others Potential: -- (n/a)  Additional Comments for Danger to Others Potential: n/a  Are There Guns or Other Weapons in Your Home? No  Types of Guns/Weapons: Denies  access  Are These Weapons Safely Secured?                            Yes  Who Could Verify You Are Able To Have These Secured: Denies access  Do You Have any Outstanding Charges, Pending Court Dates, Parole/Probation? Denies pending legal charges  Contacted To Inform of Risk of Harm To Self or Others: -- (n/a)    Does Patient Present under Involuntary Commitment? No    [place] of Residence: [place]   Patient Currently Receiving the Following Services: Not Receiving Services   Determination of Need: Routine (7 days)   Options For Referral: Medication Management; Outpatient Therapy     CCA Biopsychosocial Patient Reported Schizophrenia/Schizoaffective Diagnosis in Past: No   Strengths: Pt is aware she is in need of mental health services. She is able to express her thoughts, feelings, and concerns in an open manner. Pt was able to answer the questions posed.   Mental Health Symptoms Depression:  Hopelessness; Change in energy/activity   Duration of Depressive symptoms: Duration of Depressive Symptoms: Greater than two weeks   Mania:  None   Anxiety:   Worrying  Psychosis:  None   Duration of Psychotic symptoms: Duration of Psychotic Symptoms: N/A   Trauma:  Difficulty staying/falling asleep; Emotional numbing   Obsessions:  None   Compulsions:  None   Inattention:  None   Hyperactivity/Impulsivity:  None   Oppositional/Defiant Behaviors:  None   Emotional Irregularity:  None   Other Mood/Personality Symptoms:  None noted    Mental Status Exam Appearance and self-care  Stature:  Average   Weight:  Average weight   Clothing:  Disheveled (Pt is dressed in scrubs)   Grooming:  Neglected   Cosmetic use:  None   Posture/gait:  Normal   Motor activity:  Not Remarkable   Sensorium  Attention:  Normal   Concentration:  Normal   Orientation:  X5   Recall/memory:  Normal   Affect and Mood  Affect:  Depressed   Mood:  Depressed    Relating  Eye contact:  None   Facial expression:  Responsive   Attitude toward examiner:  Cooperative   Thought and Language  Speech flow: Clear and Coherent   Thought content:  Appropriate to Mood and Circumstances   Preoccupation:  None   Hallucinations:  None   Organization:  Coherent   [employer] of Knowledge:  Average   Intelligence:  Average   Abstraction:  Functional   Judgement:  Impaired   Reality Testing:  Adequate   Insight:  Gaps   Decision Making:  Impulsive   Social Functioning  Social Maturity:  Impulsive   Social Judgement:  Naive   Stress  Stressors:  Family conflict; Grief/losses; Housing; Illness; Legal; Relationship; Transitions   Coping Ability:  Deficient supports; Overwhelmed   Skill Deficits:  Building services engineer; Self-control   Supports:  Friends/Service system     Religion: Religion/Spirituality Are You A Religious Person?: No (Not assessed) How Might This Affect Treatment?: n/a  Leisure/Recreation: Leisure / Recreation Do You Have Hobbies?: No Leisure and Hobbies: n/a  Exercise/Diet: Exercise/Diet Do You Exercise?: No (Not assessed) Have You Gained or Lost A Significant Amount of Weight in the Past Six Months?: No (Not assessed) Do You Follow a Special Diet?: No (Not assessed) Do You Have Any Trouble Sleeping?: No Explanation of Sleeping Difficulties: n/a   CCA Employment/Education Employment/Work Situation: Employment / Work Situation Employment Situation: On disability Why is Patient on Disability: Mental Health How Long has Patient Been on Disability: over 15 yrs Patient's Job has Been Impacted by Current Illness: No Has Patient ever Been in the [employer]?: No  Education: Education Is Patient Currently Attending School?: No Last Grade Completed: 11 Did You Attend College?: No Did You Have An Individualized Education Program (IIEP): No (Not assessed) Did You Have Any  Difficulty At School?: No (Not assessed) Patient's Education Has Been Impacted by Current Illness: No   CCA Family/Childhood History Family and Relationship History: Family history Marital status: Single Does patient have children?: Yes How many children?: 3 How is patient's relationship with their children?: good  Childhood History:  Childhood History By whom was/is the patient raised?: Both parents (Not assessed) Did patient suffer any verbal/emotional/physical/sexual abuse as a child?: No Did patient suffer from severe childhood neglect?: No Has patient ever been sexually abused/assaulted/raped as an adolescent or adult?: No Was the patient ever a victim of a crime or a disaster?: No Witnessed domestic violence?: No Has patient been affected by domestic violence as an adult?: No       CCA Substance Use Alcohol/Drug Use: Alcohol /  Drug Use Pain Medications: See MAR Prescriptions: See MAR Over the Counter: See MAR History of alcohol / drug use?: No history of alcohol / drug abuse Longest period of sobriety (when/how long): Pt denies etoh and drug use Negative Consequences of Use:  (n/a) Withdrawal Symptoms: None (N/A)                         ASAM's:  Six Dimensions of Multidimensional Assessment  Dimension 1:  Acute Intoxication and/or Withdrawal Potential:      Dimension 2:  Biomedical Conditions and Complications:      Dimension 3:  Emotional, Behavioral, or Cognitive Conditions and Complications:     Dimension 4:  Readiness to Change:     Dimension 5:  Relapse, Continued use, or Continued Problem Potential:     Dimension 6:  Recovery/Living Environment:     ASAM Severity Score:    ASAM Recommended Level of Treatment: ASAM Recommended Level of Treatment:  (N/A)   Substance use Disorder (SUD) Substance Use Disorder (SUD)  Checklist Symptoms of Substance Use:  (N/A)  Recommendations for Services/Supports/Treatments: Recommendations for  Services/Supports/Treatments Recommendations For Services/Supports/Treatments: Individual Therapy, Medication Management  Disposition Recommendation per psychiatric provider: There are no psychiatric contraindications to discharge at this time   DSM5 Diagnoses: Patient Active Problem List   Diagnosis Date Noted   ERRONEOUS ENCOUNTER--DISREGARD 12/12/2023   Bipolar I disorder, current or most recent episode depressed, with psychotic features (HCC) 07/29/2021   PTSD (post-traumatic stress disorder) 07/27/2021   Hypersomnia 05/11/2016   COPD (chronic obstructive pulmonary disease) (HCC) 05/11/2016   Tobacco user 05/11/2016   Diastolic CHF (HCC) 04/06/2016   History of orthopnea 04/06/2016   Gall bladder polyp 03/09/2016   Reactive airway disease 02/21/2016   Healthcare maintenance 02/10/2016   Arthritis of knee 01/24/2016   Microcytic anemia 12/31/2015   Obesity 11/26/2015   Fibromyalgia 11/26/2015   Stress incontinence 11/26/2015   Right knee pain 11/16/2015   Left knee pain 11/16/2015   Knee pain 11/09/2015   S/P vaginal hysterectomy 10/13/2015   Right arm pain 06/24/2015   Right leg pain 06/24/2015   Back pain 06/11/2015   Menorrhagia 05/21/2015   Mallory-Weiss tear 05/21/2015   Peripheral neuropathy 05/21/2015   Depression 05/21/2015     Referrals to Alternative Service(s): Referred to Alternative Service(s):   Place:   Date:   Time:    Referred to Alternative Service(s):   Place:   Date:   Time:    Referred to Alternative Service(s):   Place:   Date:   Time:    Referred to Alternative Service(s):   Place:   Date:   Time:     Rosina PARAS, [place], [facility]

## 2024-05-02 ENCOUNTER — Encounter: Payer: Self-pay | Admitting: Radiology

## 2024-07-14 ENCOUNTER — Encounter: Payer: Self-pay | Admitting: Radiology

## 2024-10-09 ENCOUNTER — Encounter: Payer: Self-pay | Admitting: *Deleted
